# Patient Record
Sex: Male | Born: 1980
Health system: Southern US, Community
[De-identification: ages and names within clinical notes are randomized; demographics above are authoritative.]

## PROBLEM LIST (undated history)

## (undated) DIAGNOSIS — K2270 Barrett's esophagus without dysplasia: Secondary | ICD-10-CM

## (undated) DIAGNOSIS — F329 Major depressive disorder, single episode, unspecified: Secondary | ICD-10-CM

## (undated) DIAGNOSIS — F419 Anxiety disorder, unspecified: Secondary | ICD-10-CM

## (undated) DIAGNOSIS — I2699 Other pulmonary embolism without acute cor pulmonale: Secondary | ICD-10-CM

## (undated) DIAGNOSIS — T7840XA Allergy, unspecified, initial encounter: Secondary | ICD-10-CM

## (undated) DIAGNOSIS — F32A Depression, unspecified: Secondary | ICD-10-CM

## (undated) DIAGNOSIS — K219 Gastro-esophageal reflux disease without esophagitis: Secondary | ICD-10-CM

## (undated) DIAGNOSIS — J302 Other seasonal allergic rhinitis: Secondary | ICD-10-CM

## (undated) DIAGNOSIS — F4310 Post-traumatic stress disorder, unspecified: Secondary | ICD-10-CM

## (undated) HISTORY — PX: MENISCUS REPAIR: SHX5179

## (undated) HISTORY — DX: Anxiety disorder, unspecified: F41.9

## (undated) HISTORY — DX: Other seasonal allergic rhinitis: J30.2

## (undated) HISTORY — DX: Allergy, unspecified, initial encounter: T78.40XA

## (undated) HISTORY — DX: Post-traumatic stress disorder, unspecified: F43.10

## (undated) HISTORY — DX: Major depressive disorder, single episode, unspecified: F32.9

## (undated) HISTORY — PX: WRIST SURGERY: SHX841

## (undated) HISTORY — PX: SHOULDER SURGERY: SHX246

---

## 2011-08-02 DIAGNOSIS — K2270 Barrett's esophagus without dysplasia: Secondary | ICD-10-CM | POA: Insufficient documentation

## 2013-03-09 DIAGNOSIS — T7840XA Allergy, unspecified, initial encounter: Secondary | ICD-10-CM | POA: Insufficient documentation

## 2013-03-09 DIAGNOSIS — K649 Unspecified hemorrhoids: Secondary | ICD-10-CM | POA: Insufficient documentation

## 2013-03-09 DIAGNOSIS — L439 Lichen planus, unspecified: Secondary | ICD-10-CM | POA: Insufficient documentation

## 2013-03-09 DIAGNOSIS — E781 Pure hyperglyceridemia: Secondary | ICD-10-CM | POA: Insufficient documentation

## 2013-03-09 DIAGNOSIS — E559 Vitamin D deficiency, unspecified: Secondary | ICD-10-CM | POA: Insufficient documentation

## 2013-10-20 DIAGNOSIS — M549 Dorsalgia, unspecified: Secondary | ICD-10-CM | POA: Insufficient documentation

## 2014-06-26 HISTORY — PX: WRIST SURGERY: SHX841

## 2014-09-22 ENCOUNTER — Ambulatory Visit: Payer: Self-pay | Admitting: Neurology

## 2015-06-27 HISTORY — PX: OTHER SURGICAL HISTORY: SHX169

## 2015-06-27 HISTORY — PX: UPPER GASTROINTESTINAL ENDOSCOPY: SHX188

## 2015-09-30 DIAGNOSIS — F329 Major depressive disorder, single episode, unspecified: Secondary | ICD-10-CM | POA: Diagnosis not present

## 2015-09-30 DIAGNOSIS — F4310 Post-traumatic stress disorder, unspecified: Secondary | ICD-10-CM | POA: Diagnosis not present

## 2015-09-30 DIAGNOSIS — F4010 Social phobia, unspecified: Secondary | ICD-10-CM | POA: Diagnosis not present

## 2015-10-14 DIAGNOSIS — F329 Major depressive disorder, single episode, unspecified: Secondary | ICD-10-CM | POA: Diagnosis not present

## 2015-10-14 DIAGNOSIS — F4310 Post-traumatic stress disorder, unspecified: Secondary | ICD-10-CM | POA: Diagnosis not present

## 2015-10-14 DIAGNOSIS — F4010 Social phobia, unspecified: Secondary | ICD-10-CM | POA: Diagnosis not present

## 2015-10-25 DIAGNOSIS — F329 Major depressive disorder, single episode, unspecified: Secondary | ICD-10-CM | POA: Diagnosis not present

## 2015-10-25 DIAGNOSIS — F4010 Social phobia, unspecified: Secondary | ICD-10-CM | POA: Diagnosis not present

## 2015-10-25 DIAGNOSIS — F4310 Post-traumatic stress disorder, unspecified: Secondary | ICD-10-CM | POA: Diagnosis not present

## 2015-10-28 DIAGNOSIS — F4310 Post-traumatic stress disorder, unspecified: Secondary | ICD-10-CM | POA: Diagnosis not present

## 2015-10-28 DIAGNOSIS — F4010 Social phobia, unspecified: Secondary | ICD-10-CM | POA: Diagnosis not present

## 2015-10-28 DIAGNOSIS — F329 Major depressive disorder, single episode, unspecified: Secondary | ICD-10-CM | POA: Diagnosis not present

## 2015-11-08 DIAGNOSIS — F4310 Post-traumatic stress disorder, unspecified: Secondary | ICD-10-CM | POA: Diagnosis not present

## 2015-11-08 DIAGNOSIS — F329 Major depressive disorder, single episode, unspecified: Secondary | ICD-10-CM | POA: Diagnosis not present

## 2015-11-08 DIAGNOSIS — F4010 Social phobia, unspecified: Secondary | ICD-10-CM | POA: Diagnosis not present

## 2015-11-11 DIAGNOSIS — F329 Major depressive disorder, single episode, unspecified: Secondary | ICD-10-CM | POA: Diagnosis not present

## 2015-11-11 DIAGNOSIS — F4310 Post-traumatic stress disorder, unspecified: Secondary | ICD-10-CM | POA: Diagnosis not present

## 2015-11-11 DIAGNOSIS — F4010 Social phobia, unspecified: Secondary | ICD-10-CM | POA: Diagnosis not present

## 2016-06-26 DIAGNOSIS — I2699 Other pulmonary embolism without acute cor pulmonale: Secondary | ICD-10-CM

## 2016-06-26 HISTORY — DX: Other pulmonary embolism without acute cor pulmonale: I26.99

## 2016-06-26 HISTORY — PX: SHOULDER SURGERY: SHX246

## 2016-06-26 HISTORY — PX: MENISCUS REPAIR: SHX5179

## 2017-01-31 DIAGNOSIS — R7303 Prediabetes: Secondary | ICD-10-CM | POA: Diagnosis not present

## 2017-01-31 DIAGNOSIS — F419 Anxiety disorder, unspecified: Secondary | ICD-10-CM | POA: Diagnosis not present

## 2017-01-31 DIAGNOSIS — E781 Pure hyperglyceridemia: Secondary | ICD-10-CM | POA: Diagnosis not present

## 2017-01-31 DIAGNOSIS — F332 Major depressive disorder, recurrent severe without psychotic features: Secondary | ICD-10-CM | POA: Diagnosis not present

## 2017-02-22 DIAGNOSIS — F4310 Post-traumatic stress disorder, unspecified: Secondary | ICD-10-CM | POA: Diagnosis not present

## 2017-03-10 DIAGNOSIS — F4310 Post-traumatic stress disorder, unspecified: Secondary | ICD-10-CM | POA: Diagnosis not present

## 2017-03-24 DIAGNOSIS — F4310 Post-traumatic stress disorder, unspecified: Secondary | ICD-10-CM | POA: Diagnosis not present

## 2017-04-14 DIAGNOSIS — F4310 Post-traumatic stress disorder, unspecified: Secondary | ICD-10-CM | POA: Diagnosis not present

## 2017-04-28 DIAGNOSIS — F4310 Post-traumatic stress disorder, unspecified: Secondary | ICD-10-CM | POA: Diagnosis not present

## 2017-05-08 DIAGNOSIS — M25511 Pain in right shoulder: Secondary | ICD-10-CM | POA: Diagnosis not present

## 2017-05-08 DIAGNOSIS — T300 Burn of unspecified body region, unspecified degree: Secondary | ICD-10-CM | POA: Insufficient documentation

## 2017-05-08 DIAGNOSIS — W868XXA Exposure to other electric current, initial encounter: Secondary | ICD-10-CM | POA: Insufficient documentation

## 2017-05-08 DIAGNOSIS — S43022A Posterior subluxation of left humerus, initial encounter: Secondary | ICD-10-CM | POA: Insufficient documentation

## 2017-05-08 DIAGNOSIS — F4310 Post-traumatic stress disorder, unspecified: Secondary | ICD-10-CM | POA: Insufficient documentation

## 2017-05-08 DIAGNOSIS — R52 Pain, unspecified: Secondary | ICD-10-CM | POA: Diagnosis not present

## 2017-05-23 ENCOUNTER — Other Ambulatory Visit: Payer: Self-pay | Admitting: Orthopedic Surgery

## 2017-05-23 DIAGNOSIS — S43005A Unspecified dislocation of left shoulder joint, initial encounter: Secondary | ICD-10-CM

## 2017-05-25 ENCOUNTER — Ambulatory Visit
Admission: RE | Admit: 2017-05-25 | Discharge: 2017-05-25 | Disposition: A | Discharge status: Home / Self Care | Payer: Worker's Compensation | Source: Ambulatory Visit | Attending: Orthopedic Surgery | Admitting: Orthopedic Surgery

## 2017-05-25 DIAGNOSIS — S43005A Unspecified dislocation of left shoulder joint, initial encounter: Secondary | ICD-10-CM

## 2017-05-26 DIAGNOSIS — F4310 Post-traumatic stress disorder, unspecified: Secondary | ICD-10-CM | POA: Diagnosis not present

## 2017-05-30 ENCOUNTER — Other Ambulatory Visit: Payer: Self-pay

## 2017-05-31 DIAGNOSIS — F4310 Post-traumatic stress disorder, unspecified: Secondary | ICD-10-CM | POA: Diagnosis not present

## 2017-06-09 DIAGNOSIS — F4310 Post-traumatic stress disorder, unspecified: Secondary | ICD-10-CM | POA: Diagnosis not present

## 2017-06-20 ENCOUNTER — Emergency Department (HOSPITAL_COMMUNITY): Payer: Worker's Compensation

## 2017-06-20 ENCOUNTER — Encounter (HOSPITAL_COMMUNITY): Payer: Self-pay | Admitting: Emergency Medicine

## 2017-06-20 ENCOUNTER — Inpatient Hospital Stay (HOSPITAL_COMMUNITY)
Admission: EM | Admit: 2017-06-20 | Discharge: 2017-06-24 | DRG: 175 | Disposition: A | Discharge status: Home / Self Care | Payer: Worker's Compensation | Attending: Internal Medicine | Admitting: Internal Medicine

## 2017-06-20 DIAGNOSIS — M549 Dorsalgia, unspecified: Secondary | ICD-10-CM | POA: Diagnosis present

## 2017-06-20 DIAGNOSIS — J189 Pneumonia, unspecified organism: Secondary | ICD-10-CM | POA: Diagnosis present

## 2017-06-20 DIAGNOSIS — Y95 Nosocomial condition: Secondary | ICD-10-CM | POA: Diagnosis present

## 2017-06-20 DIAGNOSIS — T754XXA Electrocution, initial encounter: Secondary | ICD-10-CM | POA: Diagnosis present

## 2017-06-20 DIAGNOSIS — K219 Gastro-esophageal reflux disease without esophagitis: Secondary | ICD-10-CM | POA: Diagnosis present

## 2017-06-20 DIAGNOSIS — M898X1 Other specified disorders of bone, shoulder: Secondary | ICD-10-CM

## 2017-06-20 DIAGNOSIS — Z87891 Personal history of nicotine dependence: Secondary | ICD-10-CM

## 2017-06-20 DIAGNOSIS — R000 Tachycardia, unspecified: Secondary | ICD-10-CM | POA: Diagnosis not present

## 2017-06-20 DIAGNOSIS — J181 Lobar pneumonia, unspecified organism: Secondary | ICD-10-CM | POA: Diagnosis present

## 2017-06-20 DIAGNOSIS — R042 Hemoptysis: Secondary | ICD-10-CM | POA: Diagnosis present

## 2017-06-20 DIAGNOSIS — I2699 Other pulmonary embolism without acute cor pulmonale: Principal | ICD-10-CM | POA: Diagnosis present

## 2017-06-20 DIAGNOSIS — W868XXA Exposure to other electric current, initial encounter: Secondary | ICD-10-CM | POA: Diagnosis present

## 2017-06-20 DIAGNOSIS — S43004A Unspecified dislocation of right shoulder joint, initial encounter: Secondary | ICD-10-CM | POA: Diagnosis present

## 2017-06-20 DIAGNOSIS — Z23 Encounter for immunization: Secondary | ICD-10-CM

## 2017-06-20 DIAGNOSIS — Z79899 Other long term (current) drug therapy: Secondary | ICD-10-CM

## 2017-06-20 HISTORY — DX: Depression, unspecified: F32.A

## 2017-06-20 HISTORY — DX: Barrett's esophagus without dysplasia: K22.70

## 2017-06-20 HISTORY — DX: Gastro-esophageal reflux disease without esophagitis: K21.9

## 2017-06-20 HISTORY — DX: Major depressive disorder, single episode, unspecified: F32.9

## 2017-06-20 LAB — I-STAT TROPONIN, ED: TROPONIN I, POC: 0 ng/mL (ref 0.00–0.08)

## 2017-06-20 LAB — CBC
HCT: 39.6 % (ref 39.0–52.0)
Hemoglobin: 13.4 g/dL (ref 13.0–17.0)
MCH: 31.4 pg (ref 26.0–34.0)
MCHC: 33.8 g/dL (ref 30.0–36.0)
MCV: 92.7 fL (ref 78.0–100.0)
PLATELETS: 448 10*3/uL — AB (ref 150–400)
RBC: 4.27 MIL/uL (ref 4.22–5.81)
RDW: 13.2 % (ref 11.5–15.5)
WBC: 13.3 10*3/uL — ABNORMAL HIGH (ref 4.0–10.5)

## 2017-06-20 LAB — BASIC METABOLIC PANEL
Anion gap: 9 (ref 5–15)
BUN: 17 mg/dL (ref 6–20)
CALCIUM: 9.5 mg/dL (ref 8.9–10.3)
CO2: 26 mmol/L (ref 22–32)
CREATININE: 1.41 mg/dL — AB (ref 0.61–1.24)
Chloride: 100 mmol/L — ABNORMAL LOW (ref 101–111)
GFR calc non Af Amer: >60 mL/min (ref >60)
GLUCOSE: 215 mg/dL — AB (ref 65–99)
Potassium: 4.6 mmol/L (ref 3.5–5.1)
Sodium: 135 mmol/L (ref 135–145)

## 2017-06-20 LAB — LACTIC ACID, PLASMA: Lactic Acid, Venous: 1.3 mmol/L (ref 0.5–1.9)

## 2017-06-20 MED ORDER — IOPAMIDOL (ISOVUE-370) INJECTION 76%
INTRAVENOUS | Status: AC
  Filled 2017-06-20: qty 100

## 2017-06-20 MED ORDER — DEXTROSE 5 % IV SOLN
1.0000 g | Freq: Once | INTRAVENOUS | Status: AC
  Administered 2017-06-20: 1 g via INTRAVENOUS
  Filled 2017-06-20: qty 1

## 2017-06-20 MED ORDER — SODIUM CHLORIDE 0.9 % IV BOLUS (SEPSIS)
1000.0000 mL | Freq: Once | INTRAVENOUS | Status: AC
  Administered 2017-06-20: 1000 mL via INTRAVENOUS

## 2017-06-20 MED ORDER — ONDANSETRON HCL 4 MG/2ML IJ SOLN
4.0000 mg | Freq: Once | INTRAMUSCULAR | Status: AC
  Administered 2017-06-20: 4 mg via INTRAVENOUS
  Filled 2017-06-20: qty 2

## 2017-06-20 MED ORDER — HYDROMORPHONE HCL 1 MG/ML IJ SOLN
1.0000 mg | Freq: Once | INTRAMUSCULAR | Status: AC
  Administered 2017-06-20: 1 mg via INTRAVENOUS
  Filled 2017-06-20: qty 1

## 2017-06-20 MED ORDER — IOPAMIDOL (ISOVUE-370) INJECTION 76%
INTRAVENOUS | Status: AC
  Administered 2017-06-20: 100 mL via INTRAVENOUS
  Filled 2017-06-20: qty 100

## 2017-06-20 MED ORDER — VANCOMYCIN HCL 10 G IV SOLR
2500.0000 mg | Freq: Once | INTRAVENOUS | Status: AC
  Administered 2017-06-20: 2500 mg via INTRAVENOUS
  Filled 2017-06-20: qty 2000

## 2017-06-20 MED ORDER — OXYCODONE-ACETAMINOPHEN 5-325 MG PO TABS
2.0000 | ORAL_TABLET | Freq: Once | ORAL | Status: AC
  Administered 2017-06-20: 2 via ORAL
  Filled 2017-06-20: qty 2

## 2017-06-20 MED ORDER — DIAZEPAM 5 MG PO TABS
5.0000 mg | ORAL_TABLET | Freq: Once | ORAL | Status: AC
  Administered 2017-06-20: 5 mg via ORAL
  Filled 2017-06-20: qty 1

## 2017-06-20 NOTE — ED Provider Notes (Signed)
[facility] [place].  Please see her full H&P.  Harold Collins is a 36 y.o. male presents with chest pain, shortness of breath, scapular pain which is not reproducible on initial exam.  He is found to be tachycardic and tachypneic.  He is afebrile.  Patient with recent surgery on his shoulder.  Concern for possible PE.  Initial CT scan unequivocal for PE but does show infiltrate of the right lower lobe.  One episode of hemoptysis here in the emergency department.  Will begin treatment for healthcare acquired pneumonia including vancomycin and cefepime.   Physical Exam  BP 133/89 (BP Location: Right Arm)   Pulse (!) 106   Temp 98.2 F (36.8 C) (Oral)   Resp (!) 25   Ht 5\' 11"  (1.803 m)   Wt 109.6 kg (241 lb 11.2 oz)   SpO2 94%   BMI 33.71 kg/m   Physical Exam  Constitutional: He appears well-developed and well-nourished. No distress.  HENT:  Head: Normocephalic.  Eyes: Conjunctivae are normal. No scleral icterus.  Neck: Normal range of motion.  Cardiovascular: Intact distal pulses. Tachycardia present.  Pulmonary/Chest: Effort normal. Tachypnea noted.  Musculoskeletal: Normal range of motion.  Neurological: He is alert.  Skin: Skin is warm and dry.  Nursing note and vitals reviewed.    Results for orders placed or performed during the hospital encounter of 06/20/17  Basic metabolic panel  Result Value Ref Range   Sodium 135 135 - 145 mmol/L   Potassium 4.6 3.5 - 5.1 mmol/L   Chloride 100 (L) 101 - 111 mmol/L   CO2 26 22 - 32 mmol/L   Glucose, Bld 215 (H) 65 - 99 mg/dL   BUN 17 6 - 20 mg/dL   Creatinine, Ser 4.541.41 (H) 0.61 - 1.24 mg/dL   Calcium 9.5 8.9 - 09.810.3 mg/dL   GFR calc non Af Amer >60 >60 mL/min   GFR calc Af Amer >60 >60 mL/min   Anion gap 9 5 - 15  CBC  Result Value Ref Range   WBC 13.3 (H) 4.0 - 10.5 K/uL   RBC 4.27 4.22 - 5.81 MIL/uL   Hemoglobin 13.4 13.0 - 17.0 g/dL   HCT 11.939.6 14.739.0 - 82.952.0 %   MCV 92.7 78.0 - 100.0 fL   MCH 31.4 26.0 -  34.0 pg   MCHC 33.8 30.0 - 36.0 g/dL   RDW 56.213.2 13.011.5 - 86.515.5 %   Platelets 448 (H) 150 - 400 K/uL  Lactic acid, plasma  Result Value Ref Range   Lactic Acid, Venous 1.3 0.5 - 1.9 mmol/L  I-stat troponin, ED  Result Value Ref Range   Troponin i, poc 0.00 0.00 - 0.08 ng/mL   Comment 3           Dg Chest 2 View  Result Date: 06/20/2017 CLINICAL DATA:  Shortness of breath and back pain after shoulder surgery 1 week ago. EXAM: CHEST  2 VIEW COMPARISON:  None. FINDINGS: Limited lateral radiograph as patient is unable to elevate the arm. Cardiomediastinal silhouette is unremarkable for this low inspiratory examination with crowded vasculature markings. Patchy bibasilar airspace opacities. Trachea projects midline and there is no pneumothorax. Included soft tissue planes and osseous structures are non-suspicious. IMPRESSION: Patchy bibasilar atelectasis, less likely pneumonia in this low inspiratory examination. Electronically Signed   By: Awilda Metroourtnay  Bloomer M.D.   On: 06/20/2017 19:44   Ct Angio Chest Pe W/cm &/or Wo Cm  Addendum Date: 06/21/2017   ADDENDUM REPORT: 06/21/2017 00:52  ADDENDUM: Repeat CTA images were obtained due to motion. Additional 80 mL Isovue 370 intravenous was administered. Nonocclusive filling defects are present within the right upper lobe lobar, and segmental branches, consistent with acute pulmonary emboli. Evaluation of the right lower lobe branches are still somewhat limited by motion. RV LV ratio is normal. Critical Value/emergent results were called by telephone at the time of interpretation on 06/21/2017 at 12:51 am to Dr. Dahlia Client who assumed care of the patient for [facility] , who verbally acknowledged these results. Electronically Signed   By: Jasmine Pang M.D.   On: 06/21/2017 00:52   Addendum Date: 06/20/2017   ADDENDUM REPORT: 06/20/2017 21:43 ADDENDUM: Findings reviewed with the ordering physician, physician assistant Dayton Scrape, who tells me that the clinical  symptoms are highly worrisome for pulmonary embolism. Again, characterization of the pulmonary artery branches is significantly limited by extensive breathing motion artifact. However, the segmental pulmonary artery branches to the right lower lobe are asymmetrically prominent in size which could be a secondary sign of obstructing pulmonary embolism. Also, the dense consolidation at the right lung base could be sequela of associated pulmonary infarction. Per this discussion, ordering physician will hydrate the patient, confirm adequate renal function, provide additional pain medications and then repeat the CT angiogram for hopefully better characterization of the lower lobe pulmonary arteries. Electronically Signed   By: Bary Richard M.D.   On: 06/20/2017 21:43   Result Date: 06/21/2017 CLINICAL DATA:  Shoulder surgery on December 18th, back pain worsening since surgery. Shortness of breath yesterday. Rule out pulmonary embolism. EXAM: CT ANGIOGRAPHY CHEST WITH CONTRAST TECHNIQUE: Multidetector CT imaging of the chest was performed using the standard protocol during bolus administration of intravenous contrast. Multiplanar CT image reconstructions and MIPs were obtained to evaluate the vascular anatomy. CONTRAST:  ISOVUE-370 IOPAMIDOL (ISOVUE-370) INJECTION 76% COMPARISON:  None. FINDINGS: Cardiovascular: Study is significantly limited by patient breathing motion artifact. There is no large obstructing pulmonary embolism identified within the main or central lobar pulmonary arteries. I cannot confidently characterize the more peripheral segmental and subsegmental pulmonary arteries due to the motion artifact. Heart size is normal. No pericardial effusion. No aortic aneurysm or evidence of aortic dissection. Mediastinum/Nodes: No mass or enlarged lymph nodes seen within the mediastinum or perihilar regions. Esophagus is unremarkable. Trachea and central bronchi are unremarkable. Lungs/Pleura: Dense  consolidation within the right lower lobe, pneumonia versus aspiration. Smaller consolidation at the left lung base. Trace left pleural effusion. Upper lungs are clear. Upper Abdomen: No acute findings. Musculoskeletal: Mild degenerative spurring within the lower thoracic spine. No acute or suspicious osseous finding. Review of the MIP images confirms the above findings. IMPRESSION: 1. Dense consolidation within the right lower lobe, consistent with pneumonia or aspiration. 2. Smaller consolidation at the left lung base, compatible with atelectasis or aspiration. Associated trace left pleural effusion. 3. No central obstructing pulmonary embolism. The majority of the pulmonary artery branches, however, cannot be confidently characterized due to patient breathing motion artifact. Electronically Signed: By: Bary Richard M.D. On: 06/20/2017 20:57      ED Course/Procedures   Clinical Course as of Jun 21 113  Wed Jun 20, 2017  2357 Plan: Patient is receiving 2 L of fluid.  We will repeat his CT scan, control pain and give an additional third liter of fluid.  Patient will likely need admission  [HM]  Thu Jun 21, 2017  1610 Repeat CT shows RUL segmental lobar artery PE RV/LV ratio normal  [HM]  0052 Discussed  with Dr. Julian ReilGardner who will admit  [HM]  [id number] Continues to have tachycardia Pulse Rate: (!) 108 [HM]    Clinical Course User Index [HM] Amye Grego, Dahlia ClientHannah, PA-C      Procedures   CRITICAL CARE Performed by: Dierdre ForthHannah Tascha Casares Total critical care time: 35 minutes Critical care time was exclusive of separately billable procedures and treating other patients. Critical care was necessary to treat or prevent imminent or life-threatening deterioration. Critical care was time spent personally by me on the following activities: development of treatment plan with patient and/or surrogate as well as nursing, discussions with consultants, evaluation of patient's response to treatment, examination  of patient, obtaining history from patient or surrogate, ordering and performing treatments and interventions, ordering and review of laboratory studies, ordering and review of radiographic studies, pulse oximetry and re-evaluation of patient's condition.   MDM   Repeat his CT scan shows pulmonary embolism in addition to his pneumonia.  He will be anticoagulated with heparin and admitted to hospitalist service.  Third liter of fluid has been ordered.  He continues to be tachycardic and tachypneic.  Pain is improved but not resolved.  Discussed with Dr. Julian ReilGardner who will admit.   Tachycardia  Pain of right scapula  HCAP (healthcare-associated pneumonia)  Other acute pulmonary embolism without acute cor pulmonale [facility](HCC)       Hema Lanza, Boyd KerbsHannah, PA-C 06/21/17 [id number]    Vanetta MuldersZackowski, Scott, MD 06/21/17 1544
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
## 2017-06-20 NOTE — ED Notes (Signed)
MD reports he will come assess patient in triage.

## 2017-06-20 NOTE — ED Notes (Signed)
ED Provider at bedside. 

## 2017-06-20 NOTE — Progress Notes (Signed)
A consult was received from an ED physician for vancomycin per pharmacy dosing.  The patient's profile has been reviewed for ht/wt/allergies/indication/available labs.   A one time order has been placed for vancomycin 2500 mg IV x 1 dose.  Further antibiotics/pharmacy consults should be ordered by admitting physician if indicated.                       Thank you, Herby AbrahamMichelle T. Ransom Nickson, Pharm.D. [phone] 06/20/2017 10:19 PM

## 2017-06-20 NOTE — ED Notes (Signed)
Bed: WA06 Expected date:  Expected time:  Means of arrival:  Comments: T1 

## 2017-06-20 NOTE — ED Triage Notes (Signed)
Patient sent by orthopedist to r/o PE. Patient reports having shoulder surgery x1 week ago and back pain worsening since that time. Since yesterday patient has had SOB.

## 2017-06-21 ENCOUNTER — Encounter (HOSPITAL_COMMUNITY): Payer: Self-pay | Admitting: Physician Assistant

## 2017-06-21 ENCOUNTER — Emergency Department (HOSPITAL_COMMUNITY): Payer: Worker's Compensation

## 2017-06-21 ENCOUNTER — Inpatient Hospital Stay (HOSPITAL_COMMUNITY): Payer: Worker's Compensation

## 2017-06-21 ENCOUNTER — Other Ambulatory Visit: Payer: Self-pay

## 2017-06-21 DIAGNOSIS — R000 Tachycardia, unspecified: Secondary | ICD-10-CM | POA: Diagnosis present

## 2017-06-21 DIAGNOSIS — Y95 Nosocomial condition: Secondary | ICD-10-CM | POA: Diagnosis present

## 2017-06-21 DIAGNOSIS — J189 Pneumonia, unspecified organism: Secondary | ICD-10-CM | POA: Diagnosis present

## 2017-06-21 DIAGNOSIS — I2699 Other pulmonary embolism without acute cor pulmonale: Secondary | ICD-10-CM

## 2017-06-21 DIAGNOSIS — J181 Lobar pneumonia, unspecified organism: Secondary | ICD-10-CM | POA: Diagnosis present

## 2017-06-21 DIAGNOSIS — S43004A Unspecified dislocation of right shoulder joint, initial encounter: Secondary | ICD-10-CM | POA: Diagnosis present

## 2017-06-21 DIAGNOSIS — Z79899 Other long term (current) drug therapy: Secondary | ICD-10-CM | POA: Diagnosis not present

## 2017-06-21 DIAGNOSIS — W868XXA Exposure to other electric current, initial encounter: Secondary | ICD-10-CM | POA: Diagnosis present

## 2017-06-21 DIAGNOSIS — Z23 Encounter for immunization: Secondary | ICD-10-CM | POA: Diagnosis not present

## 2017-06-21 DIAGNOSIS — K219 Gastro-esophageal reflux disease without esophagitis: Secondary | ICD-10-CM | POA: Diagnosis present

## 2017-06-21 DIAGNOSIS — R042 Hemoptysis: Secondary | ICD-10-CM | POA: Diagnosis present

## 2017-06-21 DIAGNOSIS — Z87891 Personal history of nicotine dependence: Secondary | ICD-10-CM | POA: Diagnosis not present

## 2017-06-21 DIAGNOSIS — M898X1 Other specified disorders of bone, shoulder: Secondary | ICD-10-CM | POA: Diagnosis not present

## 2017-06-21 DIAGNOSIS — T754XXA Electrocution, initial encounter: Secondary | ICD-10-CM | POA: Diagnosis present

## 2017-06-21 DIAGNOSIS — M549 Dorsalgia, unspecified: Secondary | ICD-10-CM | POA: Diagnosis present

## 2017-06-21 HISTORY — DX: Other pulmonary embolism without acute cor pulmonale: I26.99

## 2017-06-21 LAB — COMPREHENSIVE METABOLIC PANEL
ALK PHOS: 53 U/L (ref 38–126)
ALT: 25 U/L (ref 17–63)
ANION GAP: 9 (ref 5–15)
AST: 14 U/L — ABNORMAL LOW (ref 15–41)
Albumin: 4 g/dL (ref 3.5–5.0)
BILIRUBIN TOTAL: 0.8 mg/dL (ref 0.3–1.2)
BUN: 14 mg/dL (ref 6–20)
CALCIUM: 8.7 mg/dL — AB (ref 8.9–10.3)
CO2: 25 mmol/L (ref 22–32)
Chloride: 101 mmol/L (ref 101–111)
Creatinine, Ser: 1.08 mg/dL (ref 0.61–1.24)
Glucose, Bld: 108 mg/dL — ABNORMAL HIGH (ref 65–99)
POTASSIUM: 4.3 mmol/L (ref 3.5–5.1)
Sodium: 135 mmol/L (ref 135–145)
TOTAL PROTEIN: 7.5 g/dL (ref 6.5–8.1)

## 2017-06-21 LAB — ECHOCARDIOGRAM COMPLETE
AOASC: 30 cm
CHL CUP MV DEC (S): 148
EWDT: 148 ms
FS: 36 % (ref 28–44)
Height: 71 in
IVS/LV PW RATIO, ED: 0.96
LA ID, A-P, ES: 44 mm
LA diam end sys: 44 mm
LA diam index: 1.85 cm/m2
LA vol A4C: 35.9 ml
LA vol index: 17.2 mL/m2
LAVOL: 40.8 mL
LV PW d: 11.2 mm — AB (ref 0.6–1.1)
LVOT area: 3.8 cm2
LVOT diameter: 22 mm
MV pk A vel: 67 m/s
MVPG: 3 mmHg
MVPKEVEL: 84.6 m/s
TAPSE: 37.9 mm
Weight: 3867.2 oz

## 2017-06-21 LAB — HEPARIN LEVEL (UNFRACTIONATED)
HEPARIN UNFRACTIONATED: 0.2 [IU]/mL — AB (ref 0.30–0.70)
Heparin Unfractionated: 0.13 IU/mL — ABNORMAL LOW (ref 0.30–0.70)
Heparin Unfractionated: 0.12 IU/mL — ABNORMAL LOW (ref 0.30–0.70)

## 2017-06-21 LAB — CBC
HCT: 35.6 % — ABNORMAL LOW (ref 39.0–52.0)
Hemoglobin: 11.9 g/dL — ABNORMAL LOW (ref 13.0–17.0)
MCH: 31 pg (ref 26.0–34.0)
MCHC: 33.4 g/dL (ref 30.0–36.0)
MCV: 92.7 fL (ref 78.0–100.0)
PLATELETS: 392 10*3/uL (ref 150–400)
RBC: 3.84 MIL/uL — AB (ref 4.22–5.81)
RDW: 13.2 % (ref 11.5–15.5)
WBC: 9.1 10*3/uL (ref 4.0–10.5)

## 2017-06-21 LAB — HIV ANTIBODY (ROUTINE TESTING W REFLEX): HIV Screen 4th Generation wRfx: NONREACTIVE

## 2017-06-21 LAB — EXPECTORATED SPUTUM ASSESSMENT W GRAM STAIN, RFLX TO RESP C

## 2017-06-21 LAB — APTT: APTT: 31 s (ref 24–36)

## 2017-06-21 LAB — PROTIME-INR
INR: 1.02
PROTHROMBIN TIME: 13.3 s (ref 11.4–15.2)

## 2017-06-21 LAB — STREP PNEUMONIAE URINARY ANTIGEN: STREP PNEUMO URINARY ANTIGEN: NEGATIVE

## 2017-06-21 LAB — PROCALCITONIN: PROCALCITONIN: 0.11 ng/mL

## 2017-06-21 LAB — EXPECTORATED SPUTUM ASSESSMENT W REFEX TO RESP CULTURE

## 2017-06-21 LAB — LACTIC ACID, PLASMA: Lactic Acid, Venous: 1.6 mmol/L (ref 0.5–1.9)

## 2017-06-21 LAB — TROPONIN I: Troponin I: <0.03 ng/mL (ref <0.03)

## 2017-06-21 MED ORDER — HEPARIN (PORCINE) IN NACL 100-0.45 UNIT/ML-% IJ SOLN
2600.0000 [IU]/h | INTRAMUSCULAR | Status: DC
  Administered 2017-06-21: 2600 [IU]/h via INTRAVENOUS
  Filled 2017-06-21: qty 250

## 2017-06-21 MED ORDER — FENOFIBRATE 160 MG PO TABS
160.0000 mg | ORAL_TABLET | Freq: Every day | ORAL | Status: DC
  Administered 2017-06-21 – 2017-06-24 (×4): 160 mg via ORAL
  Filled 2017-06-21 (×4): qty 1

## 2017-06-21 MED ORDER — HYDROMORPHONE HCL 1 MG/ML IJ SOLN
2.0000 mg | INTRAMUSCULAR | Status: DC | PRN
  Administered 2017-06-21 – 2017-06-23 (×8): 2 mg via INTRAVENOUS
  Filled 2017-06-21 (×8): qty 2

## 2017-06-21 MED ORDER — INFLUENZA VAC SPLIT QUAD 0.5 ML IM SUSY
0.5000 mL | PREFILLED_SYRINGE | INTRAMUSCULAR | Status: AC
  Administered 2017-06-23: 0.5 mL via INTRAMUSCULAR
  Filled 2017-06-21 (×2): qty 0.5

## 2017-06-21 MED ORDER — HEPARIN BOLUS VIA INFUSION
1300.0000 [IU] | Freq: Once | INTRAVENOUS | Status: AC
  Administered 2017-06-21: 1300 [IU] via INTRAVENOUS
  Filled 2017-06-21: qty 1300

## 2017-06-21 MED ORDER — METHOCARBAMOL 1000 MG/10ML IJ SOLN
1000.0000 mg | Freq: Three times a day (TID) | INTRAVENOUS | Status: DC
  Administered 2017-06-21 – 2017-06-24 (×9): 1000 mg via INTRAVENOUS
  Filled 2017-06-21 (×11): qty 10

## 2017-06-21 MED ORDER — ADULT MULTIVITAMIN W/MINERALS CH
1.0000 | ORAL_TABLET | Freq: Every day | ORAL | Status: DC
  Administered 2017-06-21 – 2017-06-24 (×4): 1 via ORAL
  Filled 2017-06-21 (×4): qty 1

## 2017-06-21 MED ORDER — PANTOPRAZOLE SODIUM 40 MG PO TBEC
80.0000 mg | DELAYED_RELEASE_TABLET | Freq: Every day | ORAL | Status: DC
  Administered 2017-06-21 – 2017-06-24 (×4): 80 mg via ORAL
  Filled 2017-06-21 (×4): qty 2

## 2017-06-21 MED ORDER — HEPARIN BOLUS VIA INFUSION
3000.0000 [IU] | Freq: Once | INTRAVENOUS | Status: AC
  Administered 2017-06-21: 3000 [IU] via INTRAVENOUS
  Filled 2017-06-21: qty 3000

## 2017-06-21 MED ORDER — HYDROMORPHONE HCL 1 MG/ML IJ SOLN
2.0000 mg | INTRAMUSCULAR | Status: DC | PRN
  Administered 2017-06-21 (×2): 1 mg via INTRAVENOUS
  Filled 2017-06-21 (×2): qty 2

## 2017-06-21 MED ORDER — HEPARIN BOLUS VIA INFUSION
4000.0000 [IU] | Freq: Once | INTRAVENOUS | Status: AC
  Administered 2017-06-21: 4000 [IU] via INTRAVENOUS
  Filled 2017-06-21: qty 4000

## 2017-06-21 MED ORDER — HEPARIN BOLUS VIA INFUSION
2000.0000 [IU] | Freq: Once | INTRAVENOUS | Status: AC
  Administered 2017-06-21: 2000 [IU] via INTRAVENOUS
  Filled 2017-06-21: qty 2000

## 2017-06-21 MED ORDER — NAPROXEN 500 MG PO TABS
500.0000 mg | ORAL_TABLET | Freq: Two times a day (BID) | ORAL | Status: DC
Start: 2017-06-21 — End: 2017-06-21

## 2017-06-21 MED ORDER — DIAZEPAM 5 MG PO TABS
5.0000 mg | ORAL_TABLET | Freq: Four times a day (QID) | ORAL | Status: DC | PRN
  Administered 2017-06-21: 5 mg via ORAL
  Filled 2017-06-21: qty 1

## 2017-06-21 MED ORDER — HEPARIN (PORCINE) IN NACL 100-0.45 UNIT/ML-% IJ SOLN
2150.0000 [IU]/h | INTRAMUSCULAR | Status: DC
  Administered 2017-06-21: 2000 [IU]/h via INTRAVENOUS
  Filled 2017-06-21: qty 250

## 2017-06-21 MED ORDER — VITAMIN D3 25 MCG (1000 UNIT) PO TABS
2000.0000 [IU] | ORAL_TABLET | Freq: Every day | ORAL | Status: DC
  Administered 2017-06-21 – 2017-06-24 (×4): 2000 [IU] via ORAL
  Filled 2017-06-21 (×4): qty 2

## 2017-06-21 MED ORDER — HYDROMORPHONE HCL 1 MG/ML IJ SOLN
1.0000 mg | INTRAMUSCULAR | Status: DC | PRN
  Administered 2017-06-21 (×2): 1 mg via INTRAVENOUS
  Filled 2017-06-21 (×2): qty 1

## 2017-06-21 MED ORDER — OXYCODONE-ACETAMINOPHEN 5-325 MG PO TABS
1.0000 | ORAL_TABLET | ORAL | Status: DC | PRN
  Administered 2017-06-22 – 2017-06-24 (×7): 2 via ORAL
  Filled 2017-06-21 (×8): qty 2

## 2017-06-21 MED ORDER — HEPARIN (PORCINE) IN NACL 100-0.45 UNIT/ML-% IJ SOLN
1700.0000 [IU]/h | INTRAMUSCULAR | Status: DC
  Administered 2017-06-21: 1700 [IU]/h via INTRAVENOUS
  Filled 2017-06-21: qty 250

## 2017-06-21 MED ORDER — ONDANSETRON HCL 4 MG PO TABS
4.0000 mg | ORAL_TABLET | Freq: Three times a day (TID) | ORAL | Status: DC | PRN
  Administered 2017-06-22: 4 mg via ORAL
  Filled 2017-06-21 (×2): qty 1

## 2017-06-21 MED ORDER — SODIUM CHLORIDE 0.9 % IV BOLUS (SEPSIS)
1000.0000 mL | Freq: Once | INTRAVENOUS | Status: AC
  Administered 2017-06-21: 1000 mL via INTRAVENOUS

## 2017-06-21 MED ORDER — VENLAFAXINE HCL ER 75 MG PO CP24
225.0000 mg | ORAL_CAPSULE | Freq: Every day | ORAL | Status: DC
  Administered 2017-06-21 – 2017-06-24 (×4): 225 mg via ORAL
  Filled 2017-06-21 (×4): qty 3

## 2017-06-21 MED ORDER — IOPAMIDOL (ISOVUE-370) INJECTION 76%
100.0000 mL | Freq: Once | INTRAVENOUS | Status: AC | PRN
  Administered 2017-06-21: 80 mL via INTRAVENOUS

## 2017-06-21 MED ORDER — HYDROMORPHONE HCL 1 MG/ML IJ SOLN: 1.0000 mg | INTRAMUSCULAR | Status: DC | PRN

## 2017-06-21 MED ORDER — OXYCODONE-ACETAMINOPHEN 5-325 MG PO TABS: 1.0000 | ORAL_TABLET | ORAL | Status: DC | PRN

## 2017-06-21 MED ORDER — VANCOMYCIN HCL IN DEXTROSE 1-5 GM/200ML-% IV SOLN
1000.0000 mg | Freq: Two times a day (BID) | INTRAVENOUS | Status: DC
  Administered 2017-06-21 – 2017-06-22 (×3): 1000 mg via INTRAVENOUS
  Filled 2017-06-21 (×3): qty 200

## 2017-06-21 MED ORDER — DEXTROSE 5 % IV SOLN
1.0000 g | Freq: Three times a day (TID) | INTRAVENOUS | Status: DC
  Administered 2017-06-21 – 2017-06-23 (×8): 1 g via INTRAVENOUS
  Filled 2017-06-21 (×9): qty 1

## 2017-06-21 NOTE — Progress Notes (Signed)
  Echocardiogram 2D Echocardiogram has been performed.  Harold Collins G Harold Collins 06/21/2017, 2:51 PM

## 2017-06-21 NOTE — Progress Notes (Signed)
ANTICOAGULATION CONSULT NOTE - Follow Up Consult  Pharmacy Consult for IV heparin Indication: pulmonary embolus  No Known Allergies  Patient Measurements: Height: 5\' 11"  (180.3 cm) Weight: 241 lb 11.2 oz (109.6 kg) IBW/kg (Calculated) : 75.3 Heparin Dosing Weight: 85 kg  Vital Signs: Temp: 98.1 F (36.7 C) (12/27 0611) Temp Source: Oral (12/27 0611) BP: 137/86 (12/27 0611) Pulse Rate: 101 (12/27 0611)  Labs: Recent Labs    06/20/17 1910 06/21/17 0125 06/21/17 0642  HGB 13.4  --  11.9*  HCT 39.6  --  35.6*  PLT 448*  --  392  APTT  --  31  --   LABPROT  --  13.3  --   INR  --  1.02  --   HEPARINUNFRC  --   --  0.13*  CREATININE 1.41*  --  1.08  TROPONINI  --   --  <0.03    Estimated Creatinine Clearance: 120.2 mL/min (by C-G formula based on SCr of 1.08 mg/dL).   Medical History: Past Medical History:  Diagnosis Date  . Barrett's esophagus   . Depression   . GERD (gastroesophageal reflux disease)     Medications:  Scheduled:  . cholecalciferol  2,000 Units Oral Daily  . fenofibrate  160 mg Oral Daily  . [START ON 06/22/2017] Influenza vac split quadrivalent PF  0.5 mL Intramuscular Tomorrow-1000  . iopamidol      . multivitamin with minerals  1 tablet Oral Daily  . pantoprazole  80 mg Oral Daily  . venlafaxine XR  225 mg Oral Daily   Infusions:  . ceFEPime (MAXIPIME) IV 1 g (06/21/17 0629)  . heparin 1,700 Units/hr (06/21/17 0145)  . vancomycin 1,000 mg (06/21/17 0901)    Assessment: 36 yoM s/p shoulder surgery x1 week ago now with worsening back pain and SOB. + PE on CT. IV heparin for PE.   Baseline coag labs WNL. First heparin level subtherapeutic at 0.13 units/ml.  Infusion currently at 1700 units/hr. Hgb 11.9, platelets WNL  Goal of Therapy:  Heparin level 0.3-0.7 units/ml Monitor platelets by anticoagulation protocol: Yes   Plan:  Heparin 3000 unit bolus x1 and increase rate of infusion to 2000 units/hr. Check HL in 6 hours Daily  CBC/HL while on heparin infusion.  Clance Bollunyon, Adilson Grafton 06/21/2017,9:11 AM

## 2017-06-21 NOTE — Progress Notes (Signed)
Patient ID: [name], male   DOB: 1980/07/21, 36 y.o.   MRN: [id number] Patient was admitted early this morning for chest pain or shortness of breath and was found to have pulmonary embolism along with pneumonia.  He was started on heparin drip and intravenous antibiotics.  Patient was seen and examined at bedside and plan of care discussed with the patient.  I also have spoken to the patient's wife on phone and discussed the plan of care.  I reviewed the CAT scan done on admission with the radiologist on phone.  Patient is in significant shoulder and back pain.  Increase dose and frequency of Dilaudid.  [facility]  If pain does not get better, consider PCA pump.  Patient does not have any symptoms of pulmonary TB including cough, weight loss, night sweats.  He does not have any imaging findings consistent with TB.  Discussed with infection controlled on phone and also discussed the case with his pulmonologist/Dr. Craige CottaSood who agrees that airborne precautions can be discontinued.  Discontinue airborne precautions

## 2017-06-21 NOTE — Progress Notes (Signed)
Patient coughed up small dark red tinged sputum, but nurse was not able to collect specimen for lab.

## 2017-06-21 NOTE — Progress Notes (Signed)
ANTICOAGULATION CONSULT NOTE - Follow Up Consult  Pharmacy Consult for IV heparin Indication: pulmonary embolus  No Known Allergies  Patient Measurements: Height: 5\' 11"  (180.3 cm) Weight: 241 lb 11.2 oz (109.6 kg) IBW/kg (Calculated) : 75.3 Heparin Dosing Weight: 85 kg  Vital Signs: Temp: 97.5 F (36.4 C) (12/27 2240) Temp Source: Oral (12/27 2240) BP: 162/92 (12/27 2240) Pulse Rate: 110 (12/27 2240)  Labs: Recent Labs    06/20/17 1910 06/21/17 0125 06/21/17 [id number] 06/21/17 0841 06/21/17 1508 06/21/17 2153  HGB 13.4  --  11.9*  --   --   --   HCT 39.6  --  35.6*  --   --   --   PLT 448*  --  392  --   --   --   APTT  --  31  --   --   --   --   LABPROT  --  13.3  --   --   --   --   INR  --  1.02  --   --   --   --   HEPARINUNFRC  --   --  0.13*  --  0.20* 0.12*  CREATININE 1.41*  --  1.08  --   --   --   TROPONINI  --   --  <0.03 <0.03 <0.03  --     Estimated Creatinine Clearance: 120.2 mL/min (by C-G formula based on SCr of 1.08 mg/dL).   Medical History: Past Medical History:  Diagnosis Date  . Barrett's esophagus   . Depression   . GERD (gastroesophageal reflux disease)     Medications:  Scheduled:  . cholecalciferol  2,000 Units Oral Daily  . fenofibrate  160 mg Oral Daily  . heparin  2,000 Units Intravenous Once  . [START ON 06/22/2017] Influenza vac split quadrivalent PF  0.5 mL Intramuscular Tomorrow-1000  . multivitamin with minerals  1 tablet Oral Daily  . pantoprazole  80 mg Oral Daily  . venlafaxine XR  225 mg Oral Daily   Infusions:  . ceFEPime (MAXIPIME) IV 1 g (06/21/17 2319)  . heparin    . methocarbamol (ROBAXIN)  IV 1,000 mg (06/21/17 2320)  . vancomycin 1,000 mg (06/21/17 2151)    Assessment: 36 yoM s/p shoulder surgery x1 week ago now with worsening back pain and SOB. + PE on CT. IV heparin for PE.  Baseline coag labs WNL.   2153 HL=0.12 on 2150 units/hr, still below goal, no infusion issues, pt with hymoptysis per RN.  Goal  of Therapy:  Heparin level 0.3-0.7 units/ml Monitor platelets by anticoagulation protocol: Yes   Plan:  Heparin 2000 unit bolus x1 and increase rate of infusion to 2600 units/hr. Check HL in 6 hours Daily CBC/HL while on heparin infusion.  Lorenza EvangelistGreen, Alane Hanssen R 06/21/2017, 11:30 PM

## 2017-06-21 NOTE — Consult Note (Signed)
Reason for Consult:Recent left shoulder surgery, admitted with SOB and chest pain Referring Physician: Starla Link  HPI: Jameon Deller is an 36 y.o. male well known to me, s/p recent left shoulder surgery (12/18) for repair of injury sustained during work related electrocution with traumatic posterior shoulder dislocation last month. Patient had been admitted to the burn unit at [facility] after initial electrocution injury with significant myoglobinuria and ARF.   Patient initially did well after recent shoulder surgery, reports developing right sided posterior chest pain christmas day, seen in my office yesterday with SOB and continued chest pain, referred to ED to for eval of possible PE which has been confirmed along with pneumonia.  Past Medical History:  Diagnosis Date  . Barrett's esophagus   . Depression   . GERD (gastroesophageal reflux disease)     Past Surgical History:  Procedure Laterality Date  . SHOULDER SURGERY    . WRIST SURGERY      History reviewed. No pertinent family history.  Social History:  reports that he quit smoking about 8 weeks ago. he has never used smokeless tobacco. He reports that he does not use drugs. His alcohol history is not on file.  Allergies: No Known Allergies  Medications: I have reviewed the patient's current medications.  Results for orders placed or performed during the hospital encounter of 06/20/17 (from the past 48 hour(s))  Basic metabolic panel     Status: Abnormal   Collection Time: 06/20/17  7:10 PM  Result Value Ref Range   Sodium 135 135 - 145 mmol/L   Potassium 4.6 3.5 - 5.1 mmol/L   Chloride 100 (L) 101 - 111 mmol/L   CO2 26 22 - 32 mmol/L   Glucose, Bld 215 (H) 65 - 99 mg/dL   BUN 17 6 - 20 mg/dL   Creatinine, Ser 1.41 (H) 0.61 - 1.24 mg/dL   Calcium 9.5 8.9 - 10.3 mg/dL   GFR calc non Af Amer >60 >60 mL/min   GFR calc Af Amer >60 >60 mL/min    Comment: (NOTE) The eGFR has been calculated using the CKD EPI equation. This  calculation has not been validated in all clinical situations. eGFR's persistently <60 mL/min signify possible Chronic Kidney Disease.    Anion gap 9 5 - 15  CBC     Status: Abnormal   Collection Time: 06/20/17  7:10 PM  Result Value Ref Range   WBC 13.3 (H) 4.0 - 10.5 K/uL   RBC 4.27 4.22 - 5.81 MIL/uL   Hemoglobin 13.4 13.0 - 17.0 g/dL   HCT 39.6 39.0 - 52.0 %   MCV 92.7 78.0 - 100.0 fL   MCH 31.4 26.0 - 34.0 pg   MCHC 33.8 30.0 - 36.0 g/dL   RDW 13.2 11.5 - 15.5 %   Platelets 448 (H) 150 - 400 K/uL  I-stat troponin, ED     Status: None   Collection Time: 06/20/17  7:18 PM  Result Value Ref Range   Troponin i, poc 0.00 0.00 - 0.08 ng/mL   Comment 3            Comment: Due to the release kinetics of cTnI, a negative result within the first hours of the onset of symptoms does not rule out myocardial infarction with certainty. If myocardial infarction is still suspected, repeat the test at appropriate intervals.   Lactic acid, plasma     Status: None   Collection Time: 06/20/17 10:13 PM  Result Value Ref Range   Lactic  Acid, Venous 1.3 0.5 - 1.9 mmol/L  Lactic acid, plasma     Status: None   Collection Time: 06/20/17 10:13 PM  Result Value Ref Range   Lactic Acid, Venous 1.6 0.5 - 1.9 mmol/L  APTT     Status: None   Collection Time: 06/21/17  1:25 AM  Result Value Ref Range   aPTT 31 24 - 36 seconds  Protime-INR     Status: None   Collection Time: 06/21/17  1:25 AM  Result Value Ref Range   Prothrombin Time 13.3 11.4 - 15.2 seconds   INR 1.02   Heparin level (unfractionated)     Status: Abnormal   Collection Time: 06/21/17  6:42 AM  Result Value Ref Range   Heparin Unfractionated 0.13 (L) 0.30 - 0.70 IU/mL    Comment:        IF HEPARIN RESULTS ARE BELOW EXPECTED VALUES, AND PATIENT DOSAGE HAS BEEN CONFIRMED, SUGGEST FOLLOW UP TESTING OF ANTITHROMBIN III LEVELS.   CBC     Status: Abnormal   Collection Time: 06/21/17  6:42 AM  Result Value Ref Range   WBC 9.1  4.0 - 10.5 K/uL   RBC 3.84 (L) 4.22 - 5.81 MIL/uL   Hemoglobin 11.9 (L) 13.0 - 17.0 g/dL   HCT 35.6 (L) 39.0 - 52.0 %   MCV 92.7 78.0 - 100.0 fL   MCH 31.0 26.0 - 34.0 pg   MCHC 33.4 30.0 - 36.0 g/dL   RDW 13.2 11.5 - 15.5 %   Platelets 392 150 - 400 K/uL  Procalcitonin - Baseline     Status: None   Collection Time: 06/21/17  6:42 AM  Result Value Ref Range   Procalcitonin 0.11 ng/mL    Comment:        Interpretation: PCT (Procalcitonin) <= 0.5 ng/mL: Systemic infection (sepsis) is not likely. Local bacterial infection is possible. (NOTE)       Sepsis PCT Algorithm           Lower Respiratory Tract                                      Infection PCT Algorithm    ----------------------------     ----------------------------         PCT < 0.25 ng/mL                PCT < 0.10 ng/mL         Strongly encourage             Strongly discourage   discontinuation of antibiotics    initiation of antibiotics    ----------------------------     -----------------------------       PCT 0.25 - 0.50 ng/mL            PCT 0.10 - 0.25 ng/mL               OR       >80% decrease in PCT            Discourage initiation of                                            antibiotics      Encourage discontinuation           of antibiotics    ----------------------------     -----------------------------  PCT >= 0.50 ng/mL              PCT 0.26 - 0.50 ng/mL               AND        <80% decrease in PCT             Encourage initiation of                                             antibiotics       Encourage continuation           of antibiotics    ----------------------------     -----------------------------        PCT >= 0.50 ng/mL                  PCT > 0.50 ng/mL               AND         increase in PCT                  Strongly encourage                                      initiation of antibiotics    Strongly encourage escalation           of antibiotics                                      -----------------------------                                           PCT <= 0.25 ng/mL                                                 OR                                        > 80% decrease in PCT                                     Discontinue / Do not initiate                                             antibiotics   HIV antibody (Routine Testing)     Status: None   Collection Time: 06/21/17  6:42 AM  Result Value Ref Range   HIV Screen 4th Generation wRfx Non Reactive Non Reactive    Comment: (NOTE) Performed At: [facility] [place], [place] [id number] Rush Farmer MD CM:[id number]   Troponin I (q 6hr x 3)  Status: None   Collection Time: 06/21/17  6:42 AM  Result Value Ref Range   Troponin I <0.03 <0.03 ng/mL  Comprehensive metabolic panel     Status: Abnormal   Collection Time: 06/21/17  6:42 AM  Result Value Ref Range   Sodium 135 135 - 145 mmol/L   Potassium 4.3 3.5 - 5.1 mmol/L   Chloride 101 101 - 111 mmol/L   CO2 25 22 - 32 mmol/L   Glucose, Bld 108 (H) 65 - 99 mg/dL   BUN 14 6 - 20 mg/dL   Creatinine, Ser 1.08 0.61 - 1.24 mg/dL   Calcium 8.7 (L) 8.9 - 10.3 mg/dL   Total Protein 7.5 6.5 - 8.1 g/dL   Albumin 4.0 3.5 - 5.0 g/dL   AST 14 (L) 15 - 41 U/L   ALT 25 17 - 63 U/L   Alkaline Phosphatase 53 38 - 126 U/L   Total Bilirubin 0.8 0.3 - 1.2 mg/dL   GFR calc non Af Amer >60 >60 mL/min   GFR calc Af Amer >60 >60 mL/min    Comment: (NOTE) The eGFR has been calculated using the CKD EPI equation. This calculation has not been validated in all clinical situations. eGFR's persistently <60 mL/min signify possible Chronic Kidney Disease.    Anion gap 9 5 - 15  Troponin I (q 6hr x 3)     Status: None   Collection Time: 06/21/17  8:41 AM  Result Value Ref Range   Troponin I <0.03 <0.03 ng/mL  Strep pneumoniae urinary antigen     Status: None   Collection Time: 06/21/17 11:17 AM  Result Value Ref Range   Strep Pneumo Urinary  Antigen NEGATIVE NEGATIVE    Comment:        Infection due to S. pneumoniae cannot be absolutely ruled out since the antigen present may be below the detection limit of the test. Performed at [facility], [place] [address], [place], [place] [address]   Culture, sputum-assessment     Status: None   Collection Time: 06/21/17  3:00 PM  Result Value Ref Range   Specimen Description EXPECTORATED SPUTUM    Special Requests NONE    Sputum evaluation THIS SPECIMEN IS ACCEPTABLE FOR SPUTUM CULTURE    Report Status 06/21/2017 FINAL   Troponin I (q 6hr x 3)     Status: None   Collection Time: 06/21/17  3:08 PM  Result Value Ref Range   Troponin I <0.03 <0.03 ng/mL  Heparin level (unfractionated)     Status: Abnormal   Collection Time: 06/21/17  3:08 PM  Result Value Ref Range   Heparin Unfractionated 0.20 (L) 0.30 - 0.70 IU/mL    Comment:        IF HEPARIN RESULTS ARE BELOW EXPECTED VALUES, AND PATIENT DOSAGE HAS BEEN CONFIRMED, SUGGEST FOLLOW UP TESTING OF ANTITHROMBIN III LEVELS.     Dg Chest 2 View  Result Date: 06/20/2017 CLINICAL DATA:  Shortness of breath and back pain after shoulder surgery 1 week ago. EXAM: CHEST  2 VIEW COMPARISON:  None. FINDINGS: Limited lateral radiograph as patient is unable to elevate the arm. Cardiomediastinal silhouette is unremarkable for this low inspiratory examination with crowded vasculature markings. Patchy bibasilar airspace opacities. Trachea projects midline and there is no pneumothorax. Included soft tissue planes and osseous structures are non-suspicious. IMPRESSION: Patchy bibasilar atelectasis, less likely pneumonia in this low inspiratory examination. Electronically Signed   By: Elon Alas M.D.   On: 06/20/2017 19:44  Ct Angio Chest Pe W/cm &/or Wo Cm  Addendum Date: 06/21/2017   ADDENDUM REPORT: 06/21/2017 00:52 ADDENDUM: Repeat CTA images were obtained due to motion. Additional 80 mL Isovue 370 intravenous was administered.  Nonocclusive filling defects are present within the right upper lobe lobar, and segmental branches, consistent with acute pulmonary emboli. Evaluation of the right lower lobe branches are still somewhat limited by motion. RV LV ratio is normal. Critical Value/emergent results were called by telephone at the time of interpretation on 06/21/2017 at 12:51 am to Dr. Jarrett Soho who assumed care of the patient for [facility] , who verbally acknowledged these results. Electronically Signed   By: Donavan Foil M.D.   On: 06/21/2017 00:52   Addendum Date: 06/20/2017   ADDENDUM REPORT: 06/20/2017 21:43 ADDENDUM: Findings reviewed with the ordering physician, physician assistant Valere Dross, who tells me that the clinical symptoms are highly worrisome for pulmonary embolism. Again, characterization of the pulmonary artery branches is significantly limited by extensive breathing motion artifact. However, the segmental pulmonary artery branches to the right lower lobe are asymmetrically prominent in size which could be a secondary sign of obstructing pulmonary embolism. Also, the dense consolidation at the right lung base could be sequela of associated pulmonary infarction. Per this discussion, ordering physician will hydrate the patient, confirm adequate renal function, provide additional pain medications and then repeat the CT angiogram for hopefully better characterization of the lower lobe pulmonary arteries. Electronically Signed   By: Franki Cabot M.D.   On: 06/20/2017 21:43   Result Date: 06/21/2017 CLINICAL DATA:  Shoulder surgery on December 18th, back pain worsening since surgery. Shortness of breath yesterday. Rule out pulmonary embolism. EXAM: CT ANGIOGRAPHY CHEST WITH CONTRAST TECHNIQUE: Multidetector CT imaging of the chest was performed using the standard protocol during bolus administration of intravenous contrast. Multiplanar CT image reconstructions and MIPs were obtained to evaluate the vascular anatomy.  CONTRAST:  120m ISOVUE-370 IOPAMIDOL (ISOVUE-370) INJECTION 76% COMPARISON:  None. FINDINGS: Cardiovascular: Study is significantly limited by patient breathing motion artifact. There is no large obstructing pulmonary embolism identified within the main or central lobar pulmonary arteries. I cannot confidently characterize the more peripheral segmental and subsegmental pulmonary arteries due to the motion artifact. Heart size is normal. No pericardial effusion. No aortic aneurysm or evidence of aortic dissection. Mediastinum/Nodes: No mass or enlarged lymph nodes seen within the mediastinum or perihilar regions. Esophagus is unremarkable. Trachea and central bronchi are unremarkable. Lungs/Pleura: Dense consolidation within the right lower lobe, pneumonia versus aspiration. Smaller consolidation at the left lung base. Trace left pleural effusion. Upper lungs are clear. Upper Abdomen: No acute findings. Musculoskeletal: Mild degenerative spurring within the lower thoracic spine. No acute or suspicious osseous finding. Review of the MIP images confirms the above findings. IMPRESSION: 1. Dense consolidation within the right lower lobe, consistent with pneumonia or aspiration. 2. Smaller consolidation at the left lung base, compatible with atelectasis or aspiration. Associated trace left pleural effusion. 3. No central obstructing pulmonary embolism. The majority of the pulmonary artery branches, however, cannot be confidently characterized due to patient breathing motion artifact. Electronically Signed: By: SFranki CabotM.D. On: 06/20/2017 20:57     Vitals Temp:  [98.1 F (36.7 C)-98.4 F (36.9 C)] 98.4 F (36.9 C) (12/27 1500) Pulse Rate:  [97-124] 119 (12/27 1500) Resp:  [12-36] 20 (12/27 1500) BP: (121-152)/(79-97) 152/82 (12/27 1500) SpO2:  [87 %-97 %] 93 % (12/27 1727) Weight:  [109.6 kg (241 lb 11.2 oz)] 109.6 kg (241 lb 11.2 oz) (  12/26 2210) Body mass index is 33.71 kg/m.  Physical Exam:  sitting in chair at bedside, sedated but arousable. L shoulder incision well sealed, healing as expected.     Assessment/Plan: Impression:  1 Acute PE with pneumonia 2 Recent left shoulder surgery with rotator cuff repair and bicep tenodesis, clinically healing well 3 recent electrocution injury with traumatic shoulder injury necessitating 2 above.  Treatment:  I have reassured Mr. Villena regarding his left shoulder which appears to be healing as expected, and that the repair shoulder not be at risk with his reports of "spasms" or muscle tension. We discussed that oversedation should be avoided, and we should work towards a gradual reduction in his narcotic use. Continue sling immobilization to LUE, may position arm for comfort when sitting in chair, may support with pillow, apply ice PRN. F/u in my clinic 1 month. Call if questions.  Jahmar Mckelvy M Argelia Formisano 06/21/2017, 7:07 PM  Contact # [phone]

## 2017-06-21 NOTE — Progress Notes (Signed)
Pharmacy Antibiotic Note  Harold Collins is a 36 y.o. male admitted with CP, SOB and scapular pain on 06/20/2017 with pneumonia.  Pharmacy has been consulted for cefepime and vancomycin dosing.  Plan: Cefepime 1 Gm IV q8h Vancomycin 2500 mg x1 then 1 Gm IV q12h for est AUC=503 Goal AUC=400-500 F/u cultures/levels/scr  Height: 5\' 11"  (180.3 cm) Weight: 241 lb 11.2 oz (109.6 kg) IBW/kg (Calculated) : 75.3  Temp (24hrs), Avg:98.2 F (36.8 C), Min:98.2 F (36.8 C), Max:98.2 F (36.8 C)  Recent Labs  Lab 06/20/17 1910 06/20/17 2213  WBC 13.3*  --   CREATININE 1.41*  --   LATICACIDVEN  --  1.6  1.3    Estimated Creatinine Clearance: 92.1 mL/min (A) (by C-G formula based on SCr of 1.41 mg/dL (H)).    No Known Allergies  Antimicrobials this admission: 12/26 cefepime >>  12/26 vancomycin >>   Dose adjustments this admission:   Microbiology results:  BCx:   UCx:    Sputum:    MRSA PCR:   Thank you for allowing pharmacy to be a part of this patient's care.  Lorenza EvangelistGreen, Harold Collins 06/21/2017 2:17 AM

## 2017-06-21 NOTE — Care Management (Signed)
This nurse received a phone call from a woman claiming to be the patient's workers' Science writercompensation Case Manager. She was asking for information about patient care in hospital and after discharge. This nurse gave no information and took contact information so that the proper person might contact her if appropriate.  Contact information for said "Case Manager" is below. Mariel Craftracy Raynor [phone] I was not provided with a name for her organization.

## 2017-06-21 NOTE — Progress Notes (Signed)
ANTICOAGULATION CONSULT NOTE - Follow Up Consult  Pharmacy Consult for IV heparin Indication: pulmonary embolus  No Known Allergies  Patient Measurements: Height: 5\' 11"  (180.3 cm) Weight: 241 lb 11.2 oz (109.6 kg) IBW/kg (Calculated) : 75.3 Heparin Dosing Weight: 85 kg  Vital Signs: Temp: 98.4 F (36.9 C) (12/27 1500) Temp Source: Oral (12/27 1500) BP: 152/82 (12/27 1500) Pulse Rate: 119 (12/27 1500)  Labs: Recent Labs    06/20/17 1910 06/21/17 0125 06/21/17 0642 06/21/17 0841 06/21/17 1508  HGB 13.4  --  11.9*  --   --   HCT 39.6  --  35.6*  --   --   PLT 448*  --  392  --   --   APTT  --  31  --   --   --   LABPROT  --  13.3  --   --   --   INR  --  1.02  --   --   --   HEPARINUNFRC  --   --  0.13*  --  0.20*  CREATININE 1.41*  --  1.08  --   --   TROPONINI  --   --  <0.03 <0.03  --     Estimated Creatinine Clearance: 120.2 mL/min (by C-G formula based on SCr of 1.08 mg/dL).   Medical History: Past Medical History:  Diagnosis Date  . Barrett's esophagus   . Depression   . GERD (gastroesophageal reflux disease)     Medications:  Scheduled:  . cholecalciferol  2,000 Units Oral Daily  . fenofibrate  160 mg Oral Daily  . [START ON 06/22/2017] Influenza vac split quadrivalent PF  0.5 mL Intramuscular Tomorrow-1000  . multivitamin with minerals  1 tablet Oral Daily  . pantoprazole  80 mg Oral Daily  . venlafaxine XR  225 mg Oral Daily   Infusions:  . ceFEPime (MAXIPIME) IV Stopped (06/21/17 1508)  . heparin 2,000 Units/hr (06/21/17 0933)  . methocarbamol (ROBAXIN)  IV Stopped (06/21/17 1234)  . vancomycin Stopped (06/21/17 1108)    Assessment: 36 yoM s/p shoulder surgery x1 week ago now with worsening back pain and SOB. + PE on CT. IV heparin for PE.  Baseline coag labs WNL.   Second heparin level subtherapeutic at 0.2 units/ml.  Infusion currently at 2000 units/hr. RN reports no issues  Goal of Therapy:  Heparin level 0.3-0.7 units/ml Monitor  platelets by anticoagulation protocol: Yes   Plan:  Heparin 1300 unit bolus x1 and increase rate of infusion to 2150 units/hr. Check HL in 6 hours Daily CBC/HL while on heparin infusion.  Arley Phenixllen Elijio Staples RPh 06/21/2017, 3:57 PM Pager [phone]

## 2017-06-21 NOTE — H&P (Addendum)
History and Physical    Harold Collins WUJ:[id number] DOB: Feb 02, 1981 DOA: 06/20/2017  PCP: Claretta Fraise, DO  Patient coming from: Home  I have personally briefly reviewed patient's old medical records in [facility]  Chief Complaint: CP, SOB  HPI: Harold Collins is a 36 y.o. male with medical history significant of recent surgery on shoulder about a week ago following shoulder dislocation due to electrocution injury.  Patient presents to the ED with c/o SOB and R scapular pain.  Sent in from orthopedist at [facility] due to concerns of possible PE.  Per patient he had onset of SOB while sitting down yesterday.  Some R scapular pain which has progressively worsened since onset, is now severe.  Refractory to home percocet.  No noted unilateral swelling.   ED Course: Had single episode of hemoptysis on presentation to ED.  CTA chest shows RLL> LLL consolidation, HCAP vs pulmonary infarct.  See their read regarding possible PE.   Review of Systems: As per HPI otherwise 10 point review of systems negative.   Past Medical History:  Diagnosis Date  . Barrett's esophagus   . Depression   . GERD (gastroesophageal reflux disease)     Past Surgical History:  Procedure Laterality Date  . SHOULDER SURGERY    . WRIST SURGERY       has no tobacco, alcohol, and drug history on file.  No Known Allergies  No family history on file.  No recent illness in family. Although did have a friend earlier this year diagnosed with TB.   Prior to Admission medications   Medication Sig Start Date End Date Taking? Authorizing Provider  Cholecalciferol (VITAMIN D) 2000 units tablet Take 2,000 Units by mouth daily.   Yes [provider]  diazepam (VALIUM) 5 MG tablet Take 5 mg by mouth every 6 (six) hours as needed for muscle spasms.  06/12/17  Yes [provider]  fenofibrate 160 MG tablet Take 160 mg by mouth daily.  06/11/17  Yes [provider]  Multiple  Vitamins-Minerals (MULTIVITAMIN ADULTS) TABS Take 1 tablet by mouth daily.   Yes [provider]  naproxen (NAPROSYN) 500 MG tablet Take 500 mg by mouth 2 (two) times daily with a meal.   Yes [provider]  omeprazole (PRILOSEC) 40 MG capsule Take 40 mg by mouth daily.  05/15/17  Yes [provider]  oxyCODONE-acetaminophen (PERCOCET/ROXICET) 5-325 MG tablet Take 1 tablet by mouth every 4 (four) hours as needed for moderate pain or severe pain.   Yes [provider]  Venlafaxine HCl 225 MG TB24 Take 1 tablet by mouth daily.  05/10/17  Yes [provider]  ondansetron (ZOFRAN) 4 MG tablet Take 4 mg by mouth every 8 (eight) hours as needed for nausea or vomiting.  06/12/17   [provider]    Physical Exam: Vitals:   06/20/17 2210 06/20/17 2332 06/21/17 0036 06/21/17 0146  BP:  133/89 (!) 150/89 (!) 135/97  Pulse:  (!) 106 (!) 108 (!) 103  Resp:  (!) 25 18 12   Temp:      TempSrc:      SpO2:  94% 92% 97%  Weight: 109.6 kg (241 lb 11.2 oz)     Height: 5\' 11"  (1.803 m)       Constitutional: NAD, calm, comfortable Eyes: PERRL, lids and conjunctivae normal ENMT: Mucous membranes are moist. Posterior pharynx clear of any exudate or lesions.Normal dentition.  Neck: normal, supple, no masses, no thyromegaly Respiratory: clear  to auscultation bilaterally, no wheezing, no crackles. Normal respiratory effort. No accessory muscle use.  Cardiovascular: Regular rate and rhythm, no murmurs / rubs / gallops. No extremity edema. 2+ pedal pulses. No carotid bruits.  Abdomen: no tenderness, no masses palpated. No hepatosplenomegaly. Bowel sounds positive.  Musculoskeletal: L arm in sling, pain not reproduced with palpation of R shoulder or scapula, nor movement of R shoulder patient unable to lay down due to pain however. Skin: no rashes, lesions, ulcers. No induration Neurologic: CN 2-12 grossly intact. Sensation intact, DTR normal. Strength 5/5 in  all 4.  Psychiatric: Normal judgment and insight. Alert and oriented x 3. Normal mood.    Labs on Admission: I have personally reviewed following labs and imaging studies  CBC: Recent Labs  Lab 06/20/17 1910  WBC 13.3*  HGB 13.4  HCT 39.6  MCV 92.7  PLT 448*   Basic Metabolic Panel: Recent Labs  Lab 06/20/17 1910  NA 135  K 4.6  CL 100*  CO2 26  GLUCOSE 215*  BUN 17  CREATININE 1.41*  CALCIUM 9.5   GFR: Estimated Creatinine Clearance: 92.1 mL/min (A) (by C-G formula based on SCr of 1.41 mg/dL (H)). Liver Function Tests: No results for input(s): AST, ALT, ALKPHOS, BILITOT, PROT, ALBUMIN in the last 168 hours. No results for input(s): LIPASE, AMYLASE in the last 168 hours. No results for input(s): AMMONIA in the last 168 hours. Coagulation Profile: Recent Labs  Lab 06/21/17 0125  INR 1.02   Cardiac Enzymes: No results for input(s): CKTOTAL, CKMB, CKMBINDEX, TROPONINI in the last 168 hours. BNP (last 3 results) No results for input(s): PROBNP in the last 8760 hours. HbA1C: No results for input(s): HGBA1C in the last 72 hours. CBG: No results for input(s): GLUCAP in the last 168 hours. Lipid Profile: No results for input(s): CHOL, HDL, LDLCALC, TRIG, CHOLHDL, LDLDIRECT in the last 72 hours. Thyroid Function Tests: No results for input(s): TSH, T4TOTAL, FREET4, T3FREE, THYROIDAB in the last 72 hours. Anemia Panel: No results for input(s): VITAMINB12, FOLATE, FERRITIN, TIBC, IRON, RETICCTPCT in the last 72 hours. Urine analysis: No results found for: COLORURINE, APPEARANCEUR, LABSPEC, PHURINE, GLUCOSEU, HGBUR, BILIRUBINUR, KETONESUR, PROTEINUR, UROBILINOGEN, NITRITE, LEUKOCYTESUR  Radiological Exams on Admission: Dg Chest 2 View  Result Date: 06/20/2017 CLINICAL DATA:  Shortness of breath and back pain after shoulder surgery 1 week ago. EXAM: CHEST  2 VIEW COMPARISON:  None. FINDINGS: Limited lateral radiograph as patient is unable to elevate the arm.  Cardiomediastinal silhouette is unremarkable for this low inspiratory examination with crowded vasculature markings. Patchy bibasilar airspace opacities. Trachea projects midline and there is no pneumothorax. Included soft tissue planes and osseous structures are non-suspicious. IMPRESSION: Patchy bibasilar atelectasis, less likely pneumonia in this low inspiratory examination. Electronically Signed   By: Awilda Metroourtnay  Bloomer M.D.   On: 06/20/2017 19:44   Ct Angio Chest Pe W/cm &/or Wo Cm  Addendum Date: 06/21/2017   ADDENDUM REPORT: 06/21/2017 00:52 ADDENDUM: Repeat CTA images were obtained due to motion. Additional 80 mL Isovue 370 intravenous was administered. Nonocclusive filling defects are present within the right upper lobe lobar, and segmental branches, consistent with acute pulmonary emboli. Evaluation of the right lower lobe branches are still somewhat limited by motion. RV LV ratio is normal. Critical Value/emergent results were called by telephone at the time of interpretation on 06/21/2017 at 12:51 am to Dr. Dahlia ClientHannah who assumed care of the patient for [facility] , who verbally acknowledged these results. Electronically Signed   By: Selena BattenKim  Jake Samples M.D.   On: 06/21/2017 00:52   Addendum Date: 06/20/2017   ADDENDUM REPORT: 06/20/2017 21:43 ADDENDUM: Findings reviewed with the ordering physician, physician assistant Dayton Scrape, who tells me that the clinical symptoms are highly worrisome for pulmonary embolism. Again, characterization of the pulmonary artery branches is significantly limited by extensive breathing motion artifact. However, the segmental pulmonary artery branches to the right lower lobe are asymmetrically prominent in size which could be a secondary sign of obstructing pulmonary embolism. Also, the dense consolidation at the right lung base could be sequela of associated pulmonary infarction. Per this discussion, ordering physician will hydrate the patient, confirm adequate renal function,  provide additional pain medications and then repeat the CT angiogram for hopefully better characterization of the lower lobe pulmonary arteries. Electronically Signed   By: Bary Richard M.D.   On: 06/20/2017 21:43   Result Date: 06/21/2017 CLINICAL DATA:  Shoulder surgery on December 18th, back pain worsening since surgery. Shortness of breath yesterday. Rule out pulmonary embolism. EXAM: CT ANGIOGRAPHY CHEST WITH CONTRAST TECHNIQUE: Multidetector CT imaging of the chest was performed using the standard protocol during bolus administration of intravenous contrast. Multiplanar CT image reconstructions and MIPs were obtained to evaluate the vascular anatomy. CONTRAST:  ISOVUE-370 IOPAMIDOL (ISOVUE-370) INJECTION 76% COMPARISON:  None. FINDINGS: Cardiovascular: Study is significantly limited by patient breathing motion artifact. There is no large obstructing pulmonary embolism identified within the main or central lobar pulmonary arteries. I cannot confidently characterize the more peripheral segmental and subsegmental pulmonary arteries due to the motion artifact. Heart size is normal. No pericardial effusion. No aortic aneurysm or evidence of aortic dissection. Mediastinum/Nodes: No mass or enlarged lymph nodes seen within the mediastinum or perihilar regions. Esophagus is unremarkable. Trachea and central bronchi are unremarkable. Lungs/Pleura: Dense consolidation within the right lower lobe, pneumonia versus aspiration. Smaller consolidation at the left lung base. Trace left pleural effusion. Upper lungs are clear. Upper Abdomen: No acute findings. Musculoskeletal: Mild degenerative spurring within the lower thoracic spine. No acute or suspicious osseous finding. Review of the MIP images confirms the above findings. IMPRESSION: 1. Dense consolidation within the right lower lobe, consistent with pneumonia or aspiration. 2. Smaller consolidation at the left lung base, compatible with atelectasis or  aspiration. Associated trace left pleural effusion. 3. No central obstructing pulmonary embolism. The majority of the pulmonary artery branches, however, cannot be confidently characterized due to patient breathing motion artifact. Electronically Signed: By: Bary Richard M.D. On: 06/20/2017 20:57    EKG: Independently reviewed.  Assessment/Plan Principal Problem:   Hemoptysis Active Problems:   HCAP (healthcare-associated pneumonia)   Pulmonary embolus and infarction (HCC)    1. Hemoptysis - 1. DDx includes HCAP / asp PNA from surgery, vs PE with infarction (see below for management of each) 2. Will get a Quantiferon as well as I see that he had probable exposure to TB earlier this year (Reviewed PCP note), although the CT findings certainly dont look like MTB 3. Pain control with PRN dilaudid 4. Tele monitor for tachycardia 2. Possible HCAP - 1. PNA pathway 2. Cultures pending 3. Cefepime / vanc 4. Procalcitonin pending 3. Possible PE and infarction - 1. Heparin gtt 2. 2d echo 3. BLE venous duplex  DVT prophylaxis: Heparin gtt Code Status: Full Family Communication: No family in room Disposition Plan: Home after admit Consults called: None Admission status: Admit to inpatient - inpatient status for IV pain control   GARDNER, JARED M. DO [facility] Pager [phone]  If 7AM-7PM,  please contact day team taking care of patient [email or web address] Password [email or web address]  06/21/2017, 2:36 AM

## 2017-06-21 NOTE — Progress Notes (Signed)
Patient now requiring 4.5L O2 per nasal cannula, and is at 93%. Two more instances of hymoptysis with small to moderate amount of dark red to maroon sputum. Left lung sounds include inspiratory and expiratory wheezes. MD Alekh notified, no new orders given. Will continue to monitor.

## 2017-06-21 NOTE — Progress Notes (Signed)
ANTICOAGULATION CONSULT NOTE - Initial Consult  Pharmacy Consult for IV heparin Indication: pulmonary embolus  No Known Allergies  Patient Measurements: Height: 5\' 11"  (180.3 cm) Weight: 241 lb 11.2 oz (109.6 kg) IBW/kg (Calculated) : 75.3 Heparin Dosing Weight: 85 kg  Vital Signs: Temp: 98.2 F (36.8 C) (12/26 1819) Temp Source: Oral (12/26 1819) BP: 150/89 (12/27 0036) Pulse Rate: 108 (12/27 0036)  Labs: Recent Labs    06/20/17 1910  HGB 13.4  HCT 39.6  PLT 448*  CREATININE 1.41*    Estimated Creatinine Clearance: 92.1 mL/min (A) (by C-G formula based on SCr of 1.41 mg/dL (H)).   Medical History: Past Medical History:  Diagnosis Date  . GERD (gastroesophageal reflux disease)     Medications:  Scheduled:  . iopamidol       Infusions:  . vancomycin 2,500 mg (06/20/17 2330)    Assessment: 36 yoM s/p shoulder surgery x1 week ago now with worsening back pain and SOB. + PE on CT. IV heparin for PE.  Baseline coags pending Goal of Therapy:  Heparin level 0.3-0.7 units/ml Monitor platelets by anticoagulation protocol: Yes   Plan:  Baseline aptt and PT/INR STAT Heparin 4000 unit bolus x1  Start drip at 1700 units/hr Daily CBC/HL Check 1st HL in 6 hours  Lorenza EvangelistGreen, Lexander Tremblay R 06/21/2017,1:05 AM

## 2017-06-22 ENCOUNTER — Inpatient Hospital Stay (HOSPITAL_COMMUNITY): Payer: Worker's Compensation

## 2017-06-22 DIAGNOSIS — I2699 Other pulmonary embolism without acute cor pulmonale: Secondary | ICD-10-CM

## 2017-06-22 LAB — MRSA PCR SCREENING: MRSA by PCR: NEGATIVE

## 2017-06-22 LAB — BASIC METABOLIC PANEL
ANION GAP: 9 (ref 5–15)
BUN: 13 mg/dL (ref 6–20)
CALCIUM: 9 mg/dL (ref 8.9–10.3)
CO2: 28 mmol/L (ref 22–32)
Chloride: 96 mmol/L — ABNORMAL LOW (ref 101–111)
Creatinine, Ser: 0.92 mg/dL (ref 0.61–1.24)
GFR calc Af Amer: >60 mL/min (ref >60)
GLUCOSE: 140 mg/dL — AB (ref 65–99)
Potassium: 4.4 mmol/L (ref 3.5–5.1)
Sodium: 133 mmol/L — ABNORMAL LOW (ref 135–145)

## 2017-06-22 LAB — CBC
HCT: 33.8 % — ABNORMAL LOW (ref 39.0–52.0)
Hemoglobin: 11.2 g/dL — ABNORMAL LOW (ref 13.0–17.0)
MCH: 30.4 pg (ref 26.0–34.0)
MCHC: 33.1 g/dL (ref 30.0–36.0)
MCV: 91.8 fL (ref 78.0–100.0)
Platelets: 437 10*3/uL — ABNORMAL HIGH (ref 150–400)
RBC: 3.68 MIL/uL — ABNORMAL LOW (ref 4.22–5.81)
RDW: 12.9 % (ref 11.5–15.5)
WBC: 13 10*3/uL — AB (ref 4.0–10.5)

## 2017-06-22 LAB — HEPARIN LEVEL (UNFRACTIONATED)
Heparin Unfractionated: 0.39 IU/mL (ref 0.30–0.70)
Heparin Unfractionated: 0.5 IU/mL (ref 0.30–0.70)

## 2017-06-22 LAB — MAGNESIUM: Magnesium: 1.8 mg/dL (ref 1.7–2.4)

## 2017-06-22 MED ORDER — HEPARIN (PORCINE) IN NACL 100-0.45 UNIT/ML-% IJ SOLN
2700.0000 [IU]/h | INTRAMUSCULAR | Status: DC
  Administered 2017-06-22: 2700 [IU]/h via INTRAVENOUS
  Filled 2017-06-22: qty 250

## 2017-06-22 MED ORDER — PROCHLORPERAZINE EDISYLATE 5 MG/ML IJ SOLN
10.0000 mg | Freq: Once | INTRAMUSCULAR | Status: AC
  Administered 2017-06-22: 10 mg via INTRAVENOUS
  Filled 2017-06-22: qty 2

## 2017-06-22 MED ORDER — RIVAROXABAN (XARELTO) EDUCATION KIT FOR DVT/PE PATIENTS
PACK | Freq: Once | Status: AC
  Administered 2017-06-22: 17:00:00
  Filled 2017-06-22: qty 1

## 2017-06-22 MED ORDER — ONDANSETRON HCL 4 MG/2ML IJ SOLN
4.0000 mg | Freq: Four times a day (QID) | INTRAMUSCULAR | Status: DC | PRN
  Administered 2017-06-22 – 2017-06-23 (×4): 4 mg via INTRAVENOUS
  Filled 2017-06-22 (×4): qty 2

## 2017-06-22 MED ORDER — RIVAROXABAN 15 MG PO TABS
15.0000 mg | ORAL_TABLET | Freq: Two times a day (BID) | ORAL | Status: DC
  Administered 2017-06-22 – 2017-06-24 (×4): 15 mg via ORAL
  Filled 2017-06-22 (×5): qty 1

## 2017-06-22 NOTE — Progress Notes (Addendum)
ANTICOAGULATION CONSULT NOTE - Follow Up Consult  Pharmacy Consult for IV heparin >> rivaroxaban Indication: pulmonary embolus  No Known Allergies  Patient Measurements: Height: 5\' 11"  (180.3 cm) Weight: 241 lb 11.2 oz (109.6 kg) IBW/kg (Calculated) : 75.3 Heparin Dosing Weight: 85 kg  Vital Signs: Temp: 97.8 F (36.6 C) (12/28 0650) Temp Source: Oral (12/28 0650) BP: 129/80 (12/28 0650) Pulse Rate: 97 (12/28 0650)  Labs: Recent Labs    06/20/17 1910 06/21/17 0125  06/21/17 [id number] 06/21/17 0841 06/21/17 1508 06/21/17 2153 06/22/17 0540 06/22/17 1301  HGB 13.4  --   --  11.9*  --   --   --  11.2*  --   HCT 39.6  --   --  35.6*  --   --   --  33.8*  --   PLT 448*  --   --  392  --   --   --  437*  --   APTT  --  31  --   --   --   --   --   --   --   LABPROT  --  13.3  --   --   --   --   --   --   --   INR  --  1.02  --   --   --   --   --   --   --   HEPARINUNFRC  --   --    < > 0.13*  --  0.20* 0.12* 0.39 0.50  CREATININE 1.41*  --   --  1.08  --   --   --  0.92  --   TROPONINI  --   --   --  <0.03 <0.03 <0.03  --   --   --    < > = values in this interval not displayed.    Estimated Creatinine Clearance: 141.1 mL/min (by C-G formula based on SCr of 0.92 mg/dL).   Medical History: Past Medical History:  Diagnosis Date  . Barrett's esophagus   . Depression   . GERD (gastroesophageal reflux disease)     Medications:  Scheduled:  . cholecalciferol  2,000 Units Oral Daily  . fenofibrate  160 mg Oral Daily  . Influenza vac split quadrivalent PF  0.5 mL Intramuscular Tomorrow-1000  . multivitamin with minerals  1 tablet Oral Daily  . pantoprazole  80 mg Oral Daily  . rivaroxaban   Does not apply Once  . rivaroxaban  15 mg Oral BID WC  . venlafaxine XR  225 mg Oral Daily   Infusions:  . ceFEPime (MAXIPIME) IV Stopped (06/22/17 0931)  . methocarbamol (ROBAXIN)  IV 1,000 mg (06/22/17 0550)    Assessment: 36 yoM s/p shoulder surgery x1 week ago now with  worsening back pain and SOB. + PE on CT. IV heparin for PE.  Baseline coag labs WNL.  Today, 06/22/17  Pt is being transitioned from heparin to rivaroxaban today   Hgb 11.2, plt 437   SCr 0.92, CrCl > 100 ml/min  No bleeding issues noted     Goal of Therapy:  Heparin level 0.3-0.7 units/ml Monitor platelets by anticoagulation protocol: Yes   Plan:   Stop heparin infusion and start rivaroxaban 15 mg PO BID with meals for 21 days.  After 21 days transition to rivaroxaban 20 mg PO daily with supper  Monitor for signs and symptoms of bleeding  Patient education complete   Adalberto ColeNikola Dannetta Lekas, PharmD, BCPS Pager [phone]  06/22/2017 2:30 PM

## 2017-06-22 NOTE — Progress Notes (Signed)
*  PRELIMINARY RESULTS* Vascular Ultrasound Bilateral lower extremity venous duplex has been completed.  Preliminary findings: No evidence of deep vein thrombosis or baker's cysts bilaterally.  Chauncey FischerCharlotte C Geraldine Tesar 06/22/2017, 9:21 AM

## 2017-06-22 NOTE — Progress Notes (Signed)
ANTICOAGULATION CONSULT NOTE - Follow Up Consult  Pharmacy Consult for IV heparin Indication: pulmonary embolus  No Known Allergies  Patient Measurements: Height: 5\' 11"  (180.3 cm) Weight: 241 lb 11.2 oz (109.6 kg) IBW/kg (Calculated) : 75.3 Heparin Dosing Weight: 85 kg  Vital Signs: Temp: 97.5 F (36.4 C) (12/27 2240) Temp Source: Oral (12/27 2240) BP: 162/92 (12/27 2240) Pulse Rate: 110 (12/27 2240)  Labs: Recent Labs    06/20/17 1910 06/21/17 0125  06/21/17 [id number] 06/21/17 0841 06/21/17 1508 06/21/17 2153 06/22/17 0540  HGB 13.4  --   --  11.9*  --   --   --  11.2*  HCT 39.6  --   --  35.6*  --   --   --  33.8*  PLT 448*  --   --  392  --   --   --  437*  APTT  --  31  --   --   --   --   --   --   LABPROT  --  13.3  --   --   --   --   --   --   INR  --  1.02  --   --   --   --   --   --   HEPARINUNFRC  --   --    < > 0.13*  --  0.20* 0.12* 0.39  CREATININE 1.41*  --   --  1.08  --   --   --  0.92  TROPONINI  --   --   --  <0.03 <0.03 <0.03  --   --    < > = values in this interval not displayed.    Estimated Creatinine Clearance: 141.1 mL/min (by C-G formula based on SCr of 0.92 mg/dL).   Medical History: Past Medical History:  Diagnosis Date  . Barrett's esophagus   . Depression   . GERD (gastroesophageal reflux disease)     Medications:  Scheduled:  . cholecalciferol  2,000 Units Oral Daily  . fenofibrate  160 mg Oral Daily  . Influenza vac split quadrivalent PF  0.5 mL Intramuscular Tomorrow-1000  . multivitamin with minerals  1 tablet Oral Daily  . pantoprazole  80 mg Oral Daily  . prochlorperazine  10 mg Intravenous Once  . venlafaxine XR  225 mg Oral Daily   Infusions:  . ceFEPime (MAXIPIME) IV Stopped (06/21/17 2349)  . heparin    . methocarbamol (ROBAXIN)  IV 1,000 mg (06/22/17 0550)  . vancomycin Stopped (06/21/17 2251)    Assessment: 36 yoM s/p shoulder surgery x1 week ago now with worsening back pain and SOB. + PE on CT. IV heparin  for PE.  Baseline coag labs WNL.   2153 HL=0.12 on 2150 units/hr, still below goal, no infusion issues, pt with hymoptysis per RN.  00540 HL=0.39 on 2600 units/hr at goal!, no infusion issues, RN reports vomited x2 with red streaks  Goal of Therapy:  Heparin level 0.3-0.7 units/ml Monitor platelets by anticoagulation protocol: Yes   Plan:  Increase heparin drip to 2700 units/hr to ensure stays in therapeutic range as bolus wears off Check HL in 6 hours Daily CBC/HL while on heparin infusion.  Lorenza EvangelistGreen, Yoan Sallade R 06/22/2017, 6:28 AM

## 2017-06-22 NOTE — Progress Notes (Signed)
PROGRESS NOTE    Harold Collins  NWG:[id number] DOB: March 04, 1981 DOA: 06/20/2017 PCP: Claretta Fraise, DO     Brief Narrative:  Harold Collins is a 36 y.o. male with medical history significant of recent surgery on left shoulder about a week ago following shoulder dislocation due to electrocution injury at work. Patient now presents to the ED with complaints of SOB and R scapular pain, hemoptysis.  He was sent in from orthopedist at [facility] due to concerns of possible PE. CTA chest revealed right lower lobe pneumonia as well as PE.   Assessment & Plan:   Principal Problem:   Hemoptysis Active Problems:   HCAP (healthcare-associated pneumonia)   Pulmonary embolus and infarction (HCC)   Right lower lobar pneumonia, HCAP -MRSA PCR negative, discontinue vancomycin -Dr. Hanley Ben discussed case with infection control as well as Dr. Craige Cotta and discontinued TB rule out due to low suspicion -HIV NR, strep pneumo negative  -Continue cefepime -Sputum culture pending, blood culture pending   Pulmonary embolism -Continue heparin drip, discussed with case manager regarding Xarelto coverage on Worker's Comp.  Pharmacy consulted for Xarelto education and dosing -Venous duplex negative for DVT  Right sided pleuritic back pain -Sharp pain worse with deep breaths and cough, not reproducible to palpation -Still requiring IV pain medication every 2-4 hours prn   Recent left shoulder surgery -Orthopedic surgery evaluated patient. Continue sling immobilization to LUE, may position arm for comfort when sitting in chair, may support with pillow, apply ice PRN. Follow up in clinic 1 month with Dr. Rennis Chris   DVT prophylaxis: heparin gtt --> Xarelto Code Status: Full Family Communication: At bedside Disposition Plan: Pending improvement   Consultants:   None  Procedures:   None   Antimicrobials:  Anti-infectives (From admission, onward)   Start     Dose/Rate Route Frequency Ordered Stop   06/21/17 1000  vancomycin (VANCOCIN) IVPB 1000 mg/200 mL premix  Status:  Discontinued     1,000 mg 200 mL/hr over 60 Minutes Intravenous Every 12 hours 06/21/17 0222 06/22/17 1329   06/21/17 0600  ceFEPIme (MAXIPIME) 1 g in dextrose 5 % 50 mL IVPB     1 g 100 mL/hr over 30 Minutes Intravenous Every 8 hours 06/21/17 0221     06/20/17 2230  vancomycin (VANCOCIN) 2,500 mg in sodium chloride 0.9 % 500 mL IVPB     2,500 mg 250 mL/hr over 120 Minutes Intravenous  Once 06/20/17 2218 06/21/17 0305   06/20/17 2200  ceFEPIme (MAXIPIME) 1 g in dextrose 5 % 50 mL IVPB     1 g 100 mL/hr over 30 Minutes Intravenous  Once 06/20/17 2156 06/20/17 2247       Subjective: Continues to have right sided back pain around T 7-8.  No other new complaints aside from pain.  Objective: Vitals:   06/21/17 1727 06/21/17 1939 06/21/17 2240 06/22/17 0650  BP:   (!) 162/92 129/80  Pulse:  (!) 114 (!) 110 97  Resp:   (!) 21 20  Temp:   (!) 97.5 F (36.4 C) 97.8 F (36.6 C)  TempSrc:   Oral Oral  SpO2: 93% 91% 91% 93%  Weight:      Height:        Intake/Output Summary (Last 24 hours) at 06/22/2017 1338 Last data filed at 06/22/2017 1000 Gross per 24 hour  Intake 912.7 ml  Output 1950 ml  Net -1037.3 ml   Filed Weights   06/20/17 2210  Weight: 109.6 kg (241 lb 11.2  oz)    Examination:  General exam: Appears calm but uncomfortable  Respiratory system: Clear to auscultation, right lower lobe diminished. Respiratory effort normal, tachypneic  Cardiovascular system: S1 & S2 heard, RRR. No JVD, murmurs, rubs, gallops or clicks. No pedal edema. Gastrointestinal system: Abdomen is nondistended, soft and nontender. No organomegaly or masses felt. Normal bowel sounds heard. Central nervous system: Alert and oriented. No focal neurological deficits. Extremities: Symmetric 5 x 5 power. MSK: No TTP thoracic spine  Skin: No rashes, lesions or ulcers Psychiatry: Judgement and insight appear normal. Mood &  affect appropriate.   Data Reviewed: I have personally reviewed following labs and imaging studies  CBC: Recent Labs  Lab 06/20/17 1910 06/21/17 0642 06/22/17 0540  WBC 13.3* 9.1 13.0*  HGB 13.4 11.9* 11.2*  HCT 39.6 35.6* 33.8*  MCV 92.7 92.7 91.8  PLT 448* 392 437*   Basic Metabolic Panel: Recent Labs  Lab 06/20/17 1910 06/21/17 0642 06/22/17 0540  NA 135 135 133*  K 4.6 4.3 4.4  CL 100* 101 96*  CO2 26 25 28   GLUCOSE 215* 108* 140*  BUN 17 14 13   CREATININE 1.41* 1.08 0.92  CALCIUM 9.5 8.7* 9.0  MG  --   --  1.8   GFR: Estimated Creatinine Clearance: 141.1 mL/min (by C-G formula based on SCr of 0.92 mg/dL). Liver Function Tests: Recent Labs  Lab 06/21/17 0642  AST 14*  ALT 25  ALKPHOS 53  BILITOT 0.8  PROT 7.5  ALBUMIN 4.0   No results for input(s): LIPASE, AMYLASE in the last 168 hours. No results for input(s): AMMONIA in the last 168 hours. Coagulation Profile: Recent Labs  Lab 06/21/17 0125  INR 1.02   Cardiac Enzymes: Recent Labs  Lab 06/21/17 0642 06/21/17 0841 06/21/17 1508  TROPONINI <0.03 <0.03 <0.03   BNP (last 3 results) No results for input(s): PROBNP in the last 8760 hours. HbA1C: No results for input(s): HGBA1C in the last 72 hours. CBG: No results for input(s): GLUCAP in the last 168 hours. Lipid Profile: No results for input(s): CHOL, HDL, LDLCALC, TRIG, CHOLHDL, LDLDIRECT in the last 72 hours. Thyroid Function Tests: No results for input(s): TSH, T4TOTAL, FREET4, T3FREE, THYROIDAB in the last 72 hours. Anemia Panel: No results for input(s): VITAMINB12, FOLATE, FERRITIN, TIBC, IRON, RETICCTPCT in the last 72 hours. Sepsis Labs: Recent Labs  Lab 06/20/17 2213 06/21/17 0642  PROCALCITON  --  0.11  LATICACIDVEN 1.6  1.3  --     Recent Results (from the past 240 hour(s))  Culture, sputum-assessment     Status: None   Collection Time: 06/21/17  3:00 PM  Result Value Ref Range Status   Specimen Description EXPECTORATED  SPUTUM  Final   Special Requests NONE  Final   Sputum evaluation THIS SPECIMEN IS ACCEPTABLE FOR SPUTUM CULTURE  Final   Report Status 06/21/2017 FINAL  Final  Culture, respiratory (NON-Expectorated)     Status: None (Preliminary result)   Collection Time: 06/21/17  3:00 PM  Result Value Ref Range Status   Specimen Description EXPECTORATED SPUTUM  Final   Special Requests NONE Reflexed from [id number]  Final   Gram Stain   Final    ABUNDANT WBC PRESENT,BOTH PMN AND MONONUCLEAR RARE SQUAMOUS EPITHELIAL CELLS PRESENT FEW GRAM NEGATIVE COCCI IN PAIRS RARE GRAM NEGATIVE RODS RARE GRAM POSITIVE COCCI IN CLUSTERS RARE GRAM POSITIVE RODS    Culture   Final    CULTURE REINCUBATED FOR BETTER GROWTH Performed at [facility],  [address] [place], [place] [id number]    Report Status PENDING  Incomplete  MRSA PCR Screening     Status: None   Collection Time: 06/22/17  8:14 AM  Result Value Ref Range Status   MRSA by PCR NEGATIVE NEGATIVE Final    Comment:        The GeneXpert MRSA Assay (FDA approved for NASAL specimens only), is one component of a comprehensive MRSA colonization surveillance program. It is not intended to diagnose MRSA infection nor to guide or monitor treatment for MRSA infections.        Radiology Studies: Dg Chest 2 View  Result Date: 06/20/2017 CLINICAL DATA:  Shortness of breath and back pain after shoulder surgery 1 week ago. EXAM: CHEST  2 VIEW COMPARISON:  None. FINDINGS: Limited lateral radiograph as patient is unable to elevate the arm. Cardiomediastinal silhouette is unremarkable for this low inspiratory examination with crowded vasculature markings. Patchy bibasilar airspace opacities. Trachea projects midline and there is no pneumothorax. Included soft tissue planes and osseous structures are non-suspicious. IMPRESSION: Patchy bibasilar atelectasis, less likely pneumonia in this low inspiratory examination. Electronically Signed   By: Awilda Metroourtnay   Bloomer M.D.   On: 06/20/2017 19:44   Ct Angio Chest Pe W/cm &/or Wo Cm  Addendum Date: 06/21/2017   ADDENDUM REPORT: 06/21/2017 00:52 ADDENDUM: Repeat CTA images were obtained due to motion. Additional 80 mL Isovue 370 intravenous was administered. Nonocclusive filling defects are present within the right upper lobe lobar, and segmental branches, consistent with acute pulmonary emboli. Evaluation of the right lower lobe branches are still somewhat limited by motion. RV LV ratio is normal. Critical Value/emergent results were called by telephone at the time of interpretation on 06/21/2017 at 12:51 am to Dr. Dahlia ClientHannah who assumed care of the patient for [facility]URRAY , who verbally acknowledged these results. Electronically Signed   By: Jasmine PangKim  Fujinaga M.D.   On: 06/21/2017 00:52   Addendum Date: 06/20/2017   ADDENDUM REPORT: 06/20/2017 21:43 ADDENDUM: Findings reviewed with the ordering physician, physician assistant Dayton ScrapeMurray, who tells me that the clinical symptoms are highly worrisome for pulmonary embolism. Again, characterization of the pulmonary artery branches is significantly limited by extensive breathing motion artifact. However, the segmental pulmonary artery branches to the right lower lobe are asymmetrically prominent in size which could be a secondary sign of obstructing pulmonary embolism. Also, the dense consolidation at the right lung base could be sequela of associated pulmonary infarction. Per this discussion, ordering physician will hydrate the patient, confirm adequate renal function, provide additional pain medications and then repeat the CT angiogram for hopefully better characterization of the lower lobe pulmonary arteries. Electronically Signed   By: Bary RichardStan  Maynard M.D.   On: 06/20/2017 21:43   Result Date: 06/21/2017 CLINICAL DATA:  Shoulder surgery on December 18th, back pain worsening since surgery. Shortness of breath yesterday. Rule out pulmonary embolism. EXAM: CT ANGIOGRAPHY CHEST  WITH CONTRAST TECHNIQUE: Multidetector CT imaging of the chest was performed using the standard protocol during bolus administration of intravenous contrast. Multiplanar CT image reconstructions and MIPs were obtained to evaluate the vascular anatomy. CONTRAST:  100mL ISOVUE-370 IOPAMIDOL (ISOVUE-370) INJECTION 76% COMPARISON:  None. FINDINGS: Cardiovascular: Study is significantly limited by patient breathing motion artifact. There is no large obstructing pulmonary embolism identified within the main or central lobar pulmonary arteries. I cannot confidently characterize the more peripheral segmental and subsegmental pulmonary arteries due to the motion artifact. Heart size is normal. No pericardial effusion. No aortic aneurysm or  evidence of aortic dissection. Mediastinum/Nodes: No mass or enlarged lymph nodes seen within the mediastinum or perihilar regions. Esophagus is unremarkable. Trachea and central bronchi are unremarkable. Lungs/Pleura: Dense consolidation within the right lower lobe, pneumonia versus aspiration. Smaller consolidation at the left lung base. Trace left pleural effusion. Upper lungs are clear. Upper Abdomen: No acute findings. Musculoskeletal: Mild degenerative spurring within the lower thoracic spine. No acute or suspicious osseous finding. Review of the MIP images confirms the above findings. IMPRESSION: 1. Dense consolidation within the right lower lobe, consistent with pneumonia or aspiration. 2. Smaller consolidation at the left lung base, compatible with atelectasis or aspiration. Associated trace left pleural effusion. 3. No central obstructing pulmonary embolism. The majority of the pulmonary artery branches, however, cannot be confidently characterized due to patient breathing motion artifact. Electronically Signed: By: Bary Richard M.D. On: 06/20/2017 20:57      Scheduled Meds: . cholecalciferol  2,000 Units Oral Daily  . fenofibrate  160 mg Oral Daily  . Influenza vac  split quadrivalent PF  0.5 mL Intramuscular Tomorrow-1000  . multivitamin with minerals  1 tablet Oral Daily  . pantoprazole  80 mg Oral Daily  . rivaroxaban   Does not apply Once  . venlafaxine XR  225 mg Oral Daily   Continuous Infusions: . ceFEPime (MAXIPIME) IV Stopped (06/22/17 0931)  . heparin 2,700 Units/hr (06/22/17 0925)  . methocarbamol (ROBAXIN)  IV 1,000 mg (06/22/17 0550)     LOS: 1 day    Time spent: 40 minutes   Noralee Stain, DO [facility] [email or web address] Password [email or web address] 06/22/2017, 1:38 PM

## 2017-06-22 NOTE — Discharge Instructions (Addendum)
Information on my medicine - XARELTO (rivaroxaban)  This medication education was reviewed with me or my healthcare representative as part of my discharge preparation.  The pharmacist that spoke with me during my hospital stay was:  Glogovac,nikola, [facility]  WHY WAS XARELTO PRESCRIBED FOR YOU? Xarelto was prescribed to treat blood clots that may have been found in the veins of your legs (deep vein thrombosis) or in your lungs (pulmonary embolism) and to reduce the risk of them occurring again.  What do you need to know about Xarelto? The starting dose is one 15 mg tablet taken TWICE daily with food for the FIRST 21 DAYS then on (enter date)  07/12/17  the dose is changed to one 20 mg tablet taken ONCE A DAY with your evening meal.  DO NOT stop taking Xarelto without talking to the health care provider who prescribed the medication.  Refill your prescription for 20 mg tablets before you run out.  After discharge, you should have regular check-up appointments with your healthcare provider that is prescribing your Xarelto.  In the future your dose may need to be changed if your kidney function changes by a significant amount.  What do you do if you miss a dose? If you are taking Xarelto TWICE DAILY and you miss a dose, take it as soon as you remember. You may take two 15 mg tablets (total 30 mg) at the same time then resume your regularly scheduled 15 mg twice daily the next day.  If you are taking Xarelto ONCE DAILY and you miss a dose, take it as soon as you remember on the same day then continue your regularly scheduled once daily regimen the next day. Do not take two doses of Xarelto at the same time.   Important Safety Information Xarelto is a blood thinner medicine that can cause bleeding. You should call your healthcare provider right away if you experience any of the following: ? Bleeding from an injury or your nose that does not stop. ? Unusual colored urine (red or dark brown) or  unusual colored stools (red or black). ? Unusual bruising for unknown reasons. ? A serious fall or if you hit your head (even if there is no bleeding).  Some medicines may interact with Xarelto and might increase your risk of bleeding while on Xarelto. To help avoid this, consult your healthcare provider or pharmacist prior to using any new prescription or non-prescription medications, including herbals, vitamins, non-steroidal anti-inflammatory drugs (NSAIDs) and supplements.  This website has more information on Xarelto: VisitDestination.com.br.    Pulmonary Embolism A pulmonary embolism (PE) is a sudden blockage or decrease of blood flow in one lung or both lungs. Most blockages come from a blood clot that forms in a lower leg, thigh, or arm vein (deep vein thrombosis, DVT) and travels to the lungs. A clot is blood that has thickened into a gel or solid. PE is a dangerous and life-threatening condition that needs to be treated right away. What are the causes? This condition is usually caused by a blood clot that forms in a vein and moves to the lungs. In rare cases, it may be caused by air, fat, part of a tumor, or other tissue that moves through the veins and into the lungs. What increases the risk? The following factors may make you more likely to develop this condition:  Having DVT or a history of DVT.  Being older than age 40.  Personal or family history of blood clots or  blood clotting disease.  Major or lengthy surgery.  Orthopedic surgery, especially hip or knee replacement.  Traumatic injury, such as breaking a hip or leg.  Spinal cord injury.  Stroke.  Taking medicines that contain estrogen. These include birth control pills and hormone replacement therapy.  Long-term (chronic) lung or heart disease.  Cancer and chemotherapy.  Having a central venous catheter.  Pregnancy and the period after delivery.  What are the signs or symptoms? Symptoms of this condition  usually start suddenly and include:  Shortness of breath while active or at rest.  Coughing or coughing up blood or blood-tinged mucus.  Chest pain that is often worse with deep breaths.  Rapid or irregular heartbeat.  Feeling light-headed or dizzy.  Fainting.  Feeling anxious.  Sweating.  Pain and swelling in a leg. This is a symptom of DVT, which can lead to PE.  How is this diagnosed? This condition may be diagnosed based on:  Your medical history.  A physical exam.  Blood tests to check blood oxygen level and how well your blood clots, and a D-dimer blood test, which checks your blood for a substance that is released when a blood clot breaks apart.  CT pulmonary angiogram. This test checks blood flow in and around your lungs.  Ventilation-perfusion scan, also called a lung VQ scan. This test measures air flow and blood flow to the lungs.  Ultrasound of the legs to look for blood clots.  How is this treated? Treatment for this conditions depends on many factors, such as the cause of your PE, your risk for bleeding or developing more clots, and other medical conditions you have. Treatment aims to remove, dissolve, or stop blood clots from forming or growing larger. Treatment may include:  Blood thinning medicines (anticoagulants) to stop clots from forming or growing. These medicines may be given as a pill, as an injection, or through an IV tube (infusion).  Medicines that dissolve clots (thrombolytics).  A procedure in which a flexible tube is used to remove a blood clot (embolectomy) or deliver medicine to destroy it (catheter-directed thrombolysis).  A procedure in which a filter is inserted into a large vein that carries blood to the heart (inferior vena cava). This filter (vena cava filter) catches blood clots before they reach the lungs.  Surgery to remove the clot (surgical embolectomy). This is rare.  You may need a combination of immediate, long-term (up to  3 months after diagnosis), and extended (more than 3 months after diagnosis) treatments. Your treatment may continue for several months (maintenance therapy). You and your health care provider will work together to choose the treatment program that is best for you. Follow these instructions at home: If you are taking an anticoagulant medicine:  Take the medicine every day at the same time each day.  Understand what foods and drugs interact with your medicine.  Understand the side effects of this medicine, including excessive bruising or bleeding. Ask your health care provider or pharmacist about other side effects. General instructions  Take over-the-counter and prescription medicines only as told by your health care provider.  Anticoagulant medicines may cause side effects, including easy bruising and difficulty stopping bleeding. If you are prescribed an anticoagulant: ? Hold pressure over cuts for longer than usual. ? Tell your dentist and other health care providers that you are taking anticoagulants before you have any procedure that may cause bleeding. ? Avoid contact sports. ? Be extra careful when handling sharp objects. ? Use a  soft toothbrush. Floss with waxed dental floss. ? Shave with an Neurosurgeonelectric razor.  Wear a medical alert bracelet or carry a medical alert card that says you have had a PE.  Ask your health care provider when you may return to your normal activities.  Talk with your health care provider about any travel plans. It is important to make sure that you are still able to take your medicine while on trips.  Keep all follow-up visits as told by your health care provider. This is important. How is this prevented? Take these actions to lower your risk of developing another PE:  Exercise regularly. Take frequent walks. For at least 30 minutes every day, engage in: ? Activity that involves moving your arms and legs. ? Activity that encourages good blood flow through  your body by increasing your heart rate.  While traveling, drink plenty of water and avoid drinking alcohol. Ask your health care provider if you should wear below-the-knee compression stockings.  Avoid sitting or lying in bed for long periods of time without moving your legs. Exercise your arms and legs every hour during long-distance travel (over 4 hours).  If you are hospitalized or have surgery, ask your health care provider about your risks and what treatments can help prevent blood clots.  Maintain a healthy weight. Ask your health care provider what weight is healthy for you.  If you are a woman who is over age 35, avoid unnecessary use of medicines that contain estrogen, including birth control pills.  Do not use any products that contain nicotine or tobacco, such as cigarettes and e-cigarettes. This is especially important if you take estrogen medicines. If you need help quitting, ask your health care provider.  See your health care provider for regular checkups. This may include blood tests and ultrasound testing on your legs to check for new blood clots.  Contact a health care provider if:  You missed a dose of your blood thinner medicine. Get help right away if:  You have new or increased pain, swelling, warmth, or redness in an arm or leg.  You have numbness or tingling in an arm or leg.  You have shortness of breath while active or at rest.  You have chest pain.  You have a rapid or irregular heartbeat.  You feel light-headed or dizzy.  You cough up blood.  You have blood in your vomit, stool, or urine.  You have a fever.  You have abdomen (abdominal) pain.  You have a severe fall or head injury.  You have a severe headache.  You have vision changes.  You cannot move your arms or legs.  You are confused or have memory loss.  You are bleeding for 10 minutes or more, even with strong pressure on the wound. These symptoms may represent a serious problem  that is an emergency. Do not wait to see if the symptoms will go away. Get medical help right away. Call your local emergency services (911 in the [place] Do not drive yourself to the hospital. Summary  A pulmonary embolism (PE) is a sudden blockage or decrease of blood flow in one lung or both lungs. PE is a dangerous and life-threatening condition that needs to be treated right away.  Having deep vein thrombosis (DVT) or a history of DVT is the most common risk factor for PE.  Treatments for this condition usually include medicines to thin your blood (anticoagulants) or medicines to break apart blood clots (thrombolytics).  If you  are prescribed blood thinners, it is important to take the medicine every single day at the same time each day.  If you have signs of PE or DVT, call your local emergency services (911 in the [place] This information is not intended to replace advice given to you by your health care provider. Make sure you discuss any questions you have with your health care provider. Document Released: 06/09/2000 Document Revised: 07/15/2016 Document Reviewed: 07/15/2016 Elsevier Interactive Patient Education  2018 [employer]

## 2017-06-22 NOTE — Progress Notes (Addendum)
Spoke with Harold Collins [phone] Workers Comp concerning pt discharging home with Xarelto. Workers Comp will cover the cost of Xarelto.

## 2017-06-23 LAB — CBC
HEMATOCRIT: 32.7 % — AB (ref 39.0–52.0)
Hemoglobin: 10.9 g/dL — ABNORMAL LOW (ref 13.0–17.0)
MCH: 30.5 pg (ref 26.0–34.0)
MCHC: 33.3 g/dL (ref 30.0–36.0)
MCV: 91.6 fL (ref 78.0–100.0)
PLATELETS: 493 10*3/uL — AB (ref 150–400)
RBC: 3.57 MIL/uL — AB (ref 4.22–5.81)
RDW: 12.9 % (ref 11.5–15.5)
WBC: 11 10*3/uL — AB (ref 4.0–10.5)

## 2017-06-23 LAB — BASIC METABOLIC PANEL
Anion gap: 8 (ref 5–15)
BUN: 14 mg/dL (ref 6–20)
CHLORIDE: 97 mmol/L — AB (ref 101–111)
CO2: 28 mmol/L (ref 22–32)
CREATININE: 0.82 mg/dL (ref 0.61–1.24)
Calcium: 8.7 mg/dL — ABNORMAL LOW (ref 8.9–10.3)
GFR calc Af Amer: >60 mL/min (ref >60)
GFR calc non Af Amer: >60 mL/min (ref >60)
Glucose, Bld: 108 mg/dL — ABNORMAL HIGH (ref 65–99)
POTASSIUM: 4 mmol/L (ref 3.5–5.1)
Sodium: 133 mmol/L — ABNORMAL LOW (ref 135–145)

## 2017-06-23 MED ORDER — LEVOFLOXACIN 750 MG PO TABS
750.0000 mg | ORAL_TABLET | Freq: Every day | ORAL | Status: DC
  Administered 2017-06-23 – 2017-06-24 (×2): 750 mg via ORAL
  Filled 2017-06-23 (×2): qty 1

## 2017-06-23 MED ORDER — HYDROMORPHONE HCL 1 MG/ML IJ SOLN: 1.0000 mg | INTRAMUSCULAR | Status: DC | PRN

## 2017-06-23 MED ORDER — HYDROMORPHONE HCL 1 MG/ML IJ SOLN
2.0000 mg | INTRAMUSCULAR | Status: DC | PRN
  Administered 2017-06-23: 2 mg via INTRAVENOUS
  Filled 2017-06-23: qty 2

## 2017-06-23 NOTE — Progress Notes (Signed)
PROGRESS NOTE    Harold Freshwaterric Coil  ZOX:[id number] DOB: April 03, 1981 DOA: 06/20/2017 PCP: Claretta FraiseSkariah, Anita, DO     Brief Narrative:  Harold Collins is a 36 y.o. male with medical history significant of recent surgery on left shoulder about a week ago following shoulder dislocation due to electrocution injury at work. Patient now presents to the ED with complaints of SOB and R scapular pain, hemoptysis.  He was sent in from orthopedist at [facility] due to concerns of possible PE. CTA chest revealed right lower lobe pneumonia as well as PE.   Assessment & Plan:   Principal Problem:   Hemoptysis Active Problems:   HCAP (healthcare-associated pneumonia)   Pulmonary embolus and infarction (HCC)   Right lower lobar pneumonia, HCAP -MRSA PCR negative, discontinue vancomycin -Dr. Hanley BenAlekh discussed case with infection control as well as Dr. Craige CottaSood and discontinued TB rule out due to low suspicion -HIV NR, strep pneumo negative  -Deescalate antibiotics to levaquin  -Sputum culture pending, blood culture NGTD   Pulmonary embolism -Venous duplex negative for DVT -Xarelto started   Right sided pleuritic back pain -Sharp pain worse with deep breaths and cough, not reproducible to palpation -Discussed with patient, wife, RN to start using oral pain medication over IV Dilaudid.  IV Dilaudid has made patient very drowsy  Recent left shoulder surgery -Orthopedic surgery evaluated patient. Continue sling immobilization to LUE, may position arm for comfort when sitting in chair, may support with pillow, apply ice PRN. Follow up in clinic 1 month with Dr. Rennis ChrisSupple   DVT prophylaxis: Xarelto Code Status: Full Family Communication: At bedside Disposition Plan: Pending improvement in pain control    Consultants:   Ortho   Procedures:   None   Antimicrobials:  Anti-infectives (From admission, onward)   Start     Dose/Rate Route Frequency Ordered Stop   06/23/17 1500  levofloxacin (LEVAQUIN)  tablet 750 mg     750 mg Oral Daily 06/23/17 1401     06/21/17 1000  vancomycin (VANCOCIN) IVPB 1000 mg/200 mL premix  Status:  Discontinued     1,000 mg 200 mL/hr over 60 Minutes Intravenous Every 12 hours 06/21/17 0222 06/22/17 1329   06/21/17 0600  ceFEPIme (MAXIPIME) 1 g in dextrose 5 % 50 mL IVPB  Status:  Discontinued     1 g 100 mL/hr over 30 Minutes Intravenous Every 8 hours 06/21/17 0221 06/23/17 1401   06/20/17 2230  vancomycin (VANCOCIN) 2,500 mg in sodium chloride 0.9 % 500 mL IVPB     2,500 mg 250 mL/hr over 120 Minutes Intravenous  Once 06/20/17 2218 06/21/17 0305   06/20/17 2200  ceFEPIme (MAXIPIME) 1 g in dextrose 5 % 50 mL IVPB     1 g 100 mL/hr over 30 Minutes Intravenous  Once 06/20/17 2156 06/20/17 2247       Subjective: Continues to have right sided back pain.  Has been sleeping most of the day.  Objective: Vitals:   06/22/17 1500 06/22/17 2119 06/23/17 0533 06/23/17 1304  BP: (!) 150/88 (!) 146/80 136/82 (!) 142/90  Pulse: (!) 101 (!) 101 100 88  Resp: 20 20 20 18   Temp: 97.6 F (36.4 C) 98.6 F (37 C) 98.7 F (37.1 C) 98.2 F (36.8 C)  TempSrc: Oral Oral Oral Oral  SpO2: 92% 98% 98% 94%  Weight:      Height:        Intake/Output Summary (Last 24 hours) at 06/23/2017 1403 Last data filed at 06/23/2017 [id number] Gross per  24 hour  Intake 511.35 ml  Output 450 ml  Net 61.35 ml   Filed Weights   06/20/17 2210  Weight: 109.6 kg (241 lb 11.2 oz)    Examination:  General exam: Appears calm, lethargic  Respiratory system: Clear to auscultation, right lower lobe diminished. Respiratory effort normal Cardiovascular system: S1 & S2 heard, RRR. No JVD, murmurs, rubs, gallops or clicks. No pedal edema. Gastrointestinal system: Abdomen is nondistended, soft and nontender. No organomegaly or masses felt. Normal bowel sounds heard. Central nervous system: Alert and oriented. No focal neurological deficits. Extremities: Symmetric 5 x 5 power. MSK: No TTP  thoracic spine  Skin: No rashes, lesions or ulcers Psychiatry: Judgement and insight appear normal. Mood & affect appropriate.   Data Reviewed: I have personally reviewed following labs and imaging studies  CBC: Recent Labs  Lab 06/20/17 1910 06/21/17 0642 06/22/17 0540 06/23/17 0427  WBC 13.3* 9.1 13.0* 11.0*  HGB 13.4 11.9* 11.2* 10.9*  HCT 39.6 35.6* 33.8* 32.7*  MCV 92.7 92.7 91.8 91.6  PLT 448* 392 437* 493*   Basic Metabolic Panel: Recent Labs  Lab 06/20/17 1910 06/21/17 0642 06/22/17 0540 06/23/17 0427  NA 135 135 133* 133*  K 4.6 4.3 4.4 4.0  CL 100* 101 96* 97*  CO2 26 25 28 28   GLUCOSE 215* 108* 140* 108*  BUN 17 14 13 14   CREATININE 1.41* 1.08 0.92 0.82  CALCIUM 9.5 8.7* 9.0 8.7*  MG  --   --  1.8  --    GFR: Estimated Creatinine Clearance: 158.3 mL/min (by C-G formula based on SCr of 0.82 mg/dL). Liver Function Tests: Recent Labs  Lab 06/21/17 0642  AST 14*  ALT 25  ALKPHOS 53  BILITOT 0.8  PROT 7.5  ALBUMIN 4.0   No results for input(s): LIPASE, AMYLASE in the last 168 hours. No results for input(s): AMMONIA in the last 168 hours. Coagulation Profile: Recent Labs  Lab 06/21/17 0125  INR 1.02   Cardiac Enzymes: Recent Labs  Lab 06/21/17 0642 06/21/17 0841 06/21/17 1508  TROPONINI <0.03 <0.03 <0.03   BNP (last 3 results) No results for input(s): PROBNP in the last 8760 hours. HbA1C: No results for input(s): HGBA1C in the last 72 hours. CBG: No results for input(s): GLUCAP in the last 168 hours. Lipid Profile: No results for input(s): CHOL, HDL, LDLCALC, TRIG, CHOLHDL, LDLDIRECT in the last 72 hours. Thyroid Function Tests: No results for input(s): TSH, T4TOTAL, FREET4, T3FREE, THYROIDAB in the last 72 hours. Anemia Panel: No results for input(s): VITAMINB12, FOLATE, FERRITIN, TIBC, IRON, RETICCTPCT in the last 72 hours. Sepsis Labs: Recent Labs  Lab 06/20/17 2213 06/21/17 0642  PROCALCITON  --  0.11  LATICACIDVEN 1.6  1.3   --     Recent Results (from the past 240 hour(s))  Culture, blood (routine x 2) Call MD if unable to obtain prior to antibiotics being given     Status: None (Preliminary result)   Collection Time: 06/21/17  8:41 AM  Result Value Ref Range Status   Specimen Description BLOOD RIGHT HAND  Final   Special Requests   Final    BOTTLES DRAWN AEROBIC ONLY Blood Culture adequate volume   Culture   Final    NO GROWTH 2 DAYS Performed at [facility], [address] [place], [place] [address]    Report Status PENDING  Incomplete  Culture, blood (routine x 2) Call MD if unable to obtain prior to antibiotics being given  Status: None (Preliminary result)   Collection Time: 06/21/17  8:46 AM  Result Value Ref Range Status   Specimen Description BLOOD LEFT HAND  Final   Special Requests   Final    BOTTLES DRAWN AEROBIC ONLY Blood Culture adequate volume   Culture   Final    NO GROWTH 2 DAYS Performed at [facility], [address], [place], [place] [address]    Report Status PENDING  Incomplete  Culture, sputum-assessment     Status: None   Collection Time: 06/21/17  3:00 PM  Result Value Ref Range Status   Specimen Description EXPECTORATED SPUTUM  Final   Special Requests NONE  Final   Sputum evaluation THIS SPECIMEN IS ACCEPTABLE FOR SPUTUM CULTURE  Final   Report Status 06/21/2017 FINAL  Final  Culture, respiratory (NON-Expectorated)     Status: None (Preliminary result)   Collection Time: 06/21/17  3:00 PM  Result Value Ref Range Status   Specimen Description EXPECTORATED SPUTUM  Final   Special Requests NONE Reflexed from [id number]  Final   Gram Stain   Final    ABUNDANT WBC PRESENT,BOTH PMN AND MONONUCLEAR RARE SQUAMOUS EPITHELIAL CELLS PRESENT FEW GRAM NEGATIVE COCCI IN PAIRS RARE GRAM NEGATIVE RODS RARE GRAM POSITIVE COCCI IN CLUSTERS RARE GRAM POSITIVE RODS    Culture   Final    CULTURE REINCUBATED FOR BETTER GROWTH Performed at [facility], [address], [name], [place] [address]    Report Status PENDING  Incomplete  MRSA PCR Screening     Status: None   Collection Time: 06/22/17  8:14 AM  Result Value Ref Range Status   MRSA by PCR NEGATIVE NEGATIVE Final    Comment:        The GeneXpert MRSA Assay (FDA approved for NASAL specimens only), is one component of a comprehensive MRSA colonization surveillance program. It is not intended to diagnose MRSA infection nor to guide or monitor treatment for MRSA infections.        Radiology Studies: No results found.    Scheduled Meds: . cholecalciferol  2,000 Units Oral Daily  . fenofibrate  160 mg Oral Daily  . levofloxacin  750 mg Oral Q1500  . multivitamin with minerals  1 tablet Oral Daily  . pantoprazole  80 mg Oral Daily  . rivaroxaban  15 mg Oral BID WC  . venlafaxine XR  225 mg Oral Daily   Continuous Infusions: . methocarbamol (ROBAXIN)  IV Stopped (06/23/17 0610)     LOS: 2 days    Time spent: 30 minutes   Noralee Stain, DO [facility] [email or web address] Password [email or web address] 06/23/2017, 2:03 PM

## 2017-06-24 LAB — QUANTIFERON-TB GOLD PLUS (RQFGPL)
QUANTIFERON TB2 AG VALUE: 0.02 [IU]/mL
QuantiFERON Mitogen Value: >10 IU/mL
QuantiFERON Nil Value: 0.03 IU/mL
QuantiFERON TB1 Ag Value: 0.03 IU/mL

## 2017-06-24 LAB — CULTURE, RESPIRATORY

## 2017-06-24 LAB — QUANTIFERON-TB GOLD PLUS: QuantiFERON-TB Gold Plus: NEGATIVE

## 2017-06-24 LAB — CULTURE, RESPIRATORY W GRAM STAIN: Culture: NORMAL

## 2017-06-24 MED ORDER — OXYCODONE-ACETAMINOPHEN 5-325 MG PO TABS: 1.0000 | ORAL_TABLET | ORAL | 0 refills | Status: DC | PRN

## 2017-06-24 MED ORDER — RIVAROXABAN (XARELTO) VTE STARTER PACK (15 & 20 MG): ORAL_TABLET | ORAL | 0 refills | Status: DC

## 2017-06-24 MED ORDER — LEVOFLOXACIN 750 MG PO TABS: 750.0000 mg | ORAL_TABLET | Freq: Every day | ORAL | 0 refills | Status: AC

## 2017-06-24 NOTE — Discharge Summary (Signed)
Physician Discharge Summary  Harold Collins ZOX:[id number] DOB: 1981-06-05 DOA: 06/20/2017  PCP: Claretta Fraise, DO  Admit date: 06/20/2017 Discharge date: 06/24/2017  Admitted From: Home Disposition:  Home  Recommendations for Outpatient Follow-up:  1. Follow up with PCP in 1 week 2. Follow up with Dr. Rennis Chris in 1 month  3. Please follow up on the following pending results: final sputum culture, blood culture result   Home Health: None  Equipment/Devices: O2   Discharge Condition: Stable CODE STATUS: Full  Diet recommendation: Heart healthy   Brief/Interim Summary: Harold Collins a 36 y.o.malewith medical history significant ofrecent surgery on left shoulder about a week ago following shoulder dislocation due to electrocution injury at work. Patient now presents to the ED with complaints of SOB and R scapular pain, hemoptysis. He was sent in from orthopedist at [facility] due to concerns of possible PE. CTA chest revealed right lower lobe pneumonia as well as PE.  He was started on IV antibiotics for healthcare acquired pneumonia as well as heparin drip for pulmonary embolism.  These are likely related to recent hospitalization for left shoulder injury sustained at work.  His antibiotics were eventually escalated to oral Levaquin.  After discussion with family, patient was started on Xarelto for anticoagulation.  As patient did have a provoked first-time thrombosis, he will need at least 109-month anticoagulation.  Hospitalization was complicated by pleuritic chest pain.  His pain continued to improve and was doing well on oral pain medication.  Patient also desaturated on room air and required home oxygen on discharge.  Discharge Diagnoses:  Principal Problem:   Hemoptysis Active Problems:   HCAP (healthcare-associated pneumonia)   Pulmonary embolus and infarction (HCC)   Right lower lobar pneumonia, HCAP -MRSA PCR negative, discontinue vancomycin -Dr. Hanley Ben discussed case  with infection control as well as Dr. Craige Cotta and discontinued TB rule out due to low suspicion -HIV NR, strep pneumo negative  -Deescalate antibiotics to levaquin  -Sputum culture pending, blood culture NGTD   Pulmonary embolism -Venous duplex negative for DVT -Xarelto started   Right sided pleuritic back pain -Sharp pain worse with deep breaths and cough, not reproducible to palpation -Improved today, has not required IV pain medication in 24 hours   Recent left shoulder surgery -Orthopedic surgery evaluated patient. Continue sling immobilization to LUE, may position arm for comfort when sitting in chair, may support with pillow, apply ice PRN. Follow up in clinic 1 month with Dr. Rennis Chris     Discharge Instructions  Discharge Instructions    Call MD for:   Complete by:  As directed    Worsening shortness of breath   Call MD for:  difficulty breathing, headache or visual disturbances   Complete by:  As directed    Call MD for:  extreme fatigue   Complete by:  As directed    Call MD for:  hives   Complete by:  As directed    Call MD for:  persistant dizziness or light-headedness   Complete by:  As directed    Call MD for:  persistant nausea and vomiting   Complete by:  As directed    Call MD for:  severe uncontrolled pain   Complete by:  As directed    Call MD for:  temperature >100.4   Complete by:  As directed    Diet - low sodium heart healthy   Complete by:  As directed    Discharge instructions   Complete by:  As directed  You were cared for by a hospitalist during your hospital stay. If you have any questions about your discharge medications or the care you received while you were in the hospital after you are discharged, you can call the unit and asked to speak with the hospitalist on call if the hospitalist that took care of you is not available. Once you are discharged, your primary care physician will handle any further medical issues. Please note that NO REFILLS  for any discharge medications will be authorized once you are discharged, as it is imperative that you return to your primary care physician (or establish a relationship with a primary care physician if you do not have one) for your aftercare needs so that they can reassess your need for medications and monitor your lab values.   Increase activity slowly   Complete by:  As directed      Allergies as of 06/24/2017   No Known Allergies     Medication List    TAKE these medications   diazepam 5 MG tablet Commonly known as:  VALIUM Take 5 mg by mouth every 6 (six) hours as needed for muscle spasms.   fenofibrate 160 MG tablet Take 160 mg by mouth daily.   levofloxacin 750 MG tablet Commonly known as:  LEVAQUIN Take 1 tablet (750 mg total) by mouth daily at 3 pm for 2 days.   MULTIVITAMIN ADULTS Tabs Take 1 tablet by mouth daily.   naproxen 500 MG tablet Commonly known as:  NAPROSYN Take 500 mg by mouth 2 (two) times daily with a meal.   omeprazole 40 MG capsule Commonly known as:  PRILOSEC Take 40 mg by mouth daily.   ondansetron 4 MG tablet Commonly known as:  ZOFRAN Take 4 mg by mouth every 8 (eight) hours as needed for nausea or vomiting.   oxyCODONE-acetaminophen 5-325 MG tablet Commonly known as:  PERCOCET/ROXICET Take 1-2 tablets by mouth every 4 (four) hours as needed for moderate pain or severe pain. What changed:  how much to take   Rivaroxaban 15 & 20 MG Tbpk Start with one 15mg  tablet by mouth twice a day with food. On Day 22, switch to one 20mg  tablet once a day with food.   Venlafaxine HCl 225 MG Tb24 Take 1 tablet by mouth daily.   Vitamin D 2000 units tablet Take 2,000 Units by mouth daily.            Durable Medical Equipment  (From admission, onward)        Start     Ordered   06/24/17 1230  For home use only DME oxygen  Once    Question Answer Comment  Mode or (Route) Nasal cannula   Liters per Minute 2   Frequency Continuous  (stationary and portable oxygen unit needed)   Oxygen delivery system Gas      06/24/17 1229     Follow-up Information    Claretta Fraise, DO. Schedule an appointment as soon as possible for a visit in 1 week(s).   Specialty:  Internal Medicine Contact information: [address] [facility] [place] [address] [phone]        Francena Hanly, MD. Schedule an appointment as soon as possible for a visit in 1 month(s).   Specialty:  Orthopedic Surgery Contact information: [address] [place] [address] [phone]          No Known Allergies  Consultations:  Orthopedic surgery  Procedures/Studies: Dg Chest 2 View  Result Date: 06/20/2017 CLINICAL DATA:  Shortness of breath and back pain after shoulder surgery 1 week ago. EXAM: CHEST  2 VIEW COMPARISON:  None. FINDINGS: Limited lateral radiograph as patient is unable to elevate the arm. Cardiomediastinal silhouette is unremarkable for this low inspiratory examination with crowded vasculature markings. Patchy bibasilar airspace opacities. Trachea projects midline and there is no pneumothorax. Included soft tissue planes and osseous structures are non-suspicious. IMPRESSION: Patchy bibasilar atelectasis, less likely pneumonia in this low inspiratory examination. Electronically Signed   By: Awilda Metro M.D.   On: 06/20/2017 19:44   Ct Angio Chest Pe W/cm &/or Wo Cm  Addendum Date: 06/21/2017   ADDENDUM REPORT: 06/21/2017 00:52 ADDENDUM: Repeat CTA images were obtained due to motion. Additional 80 mL Isovue 370 intravenous was administered. Nonocclusive filling defects are present within the right upper lobe lobar, and segmental branches, consistent with acute pulmonary emboli. Evaluation of the right lower lobe branches are still somewhat limited by motion. RV LV ratio is normal. Critical Value/emergent results were called by telephone at the time of  interpretation on 06/21/2017 at 12:51 am to Dr. Dahlia Client who assumed care of the patient for [facility] , who verbally acknowledged these results. Electronically Signed   By: Jasmine Pang M.D.   On: 06/21/2017 00:52   Addendum Date: 06/20/2017   ADDENDUM REPORT: 06/20/2017 21:43 ADDENDUM: Findings reviewed with the ordering physician, physician assistant Dayton Scrape, who tells me that the clinical symptoms are highly worrisome for pulmonary embolism. Again, characterization of the pulmonary artery branches is significantly limited by extensive breathing motion artifact. However, the segmental pulmonary artery branches to the right lower lobe are asymmetrically prominent in size which could be a secondary sign of obstructing pulmonary embolism. Also, the dense consolidation at the right lung base could be sequela of associated pulmonary infarction. Per this discussion, ordering physician will hydrate the patient, confirm adequate renal function, provide additional pain medications and then repeat the CT angiogram for hopefully better characterization of the lower lobe pulmonary arteries. Electronically Signed   By: Bary Richard M.D.   On: 06/20/2017 21:43   Result Date: 06/21/2017 CLINICAL DATA:  Shoulder surgery on December 18th, back pain worsening since surgery. Shortness of breath yesterday. Rule out pulmonary embolism. EXAM: CT ANGIOGRAPHY CHEST WITH CONTRAST TECHNIQUE: Multidetector CT imaging of the chest was performed using the standard protocol during bolus administration of intravenous contrast. Multiplanar CT image reconstructions and MIPs were obtained to evaluate the vascular anatomy. CONTRAST:  ISOVUE-370 IOPAMIDOL (ISOVUE-370) INJECTION 76% COMPARISON:  None. FINDINGS: Cardiovascular: Study is significantly limited by patient breathing motion artifact. There is no large obstructing pulmonary embolism identified within the main or central lobar pulmonary arteries. I cannot confidently  characterize the more peripheral segmental and subsegmental pulmonary arteries due to the motion artifact. Heart size is normal. No pericardial effusion. No aortic aneurysm or evidence of aortic dissection. Mediastinum/Nodes: No mass or enlarged lymph nodes seen within the mediastinum or perihilar regions. Esophagus is unremarkable. Trachea and central bronchi are unremarkable. Lungs/Pleura: Dense consolidation within the right lower lobe, pneumonia versus aspiration. Smaller consolidation at the left lung base. Trace left pleural effusion. Upper lungs are clear. Upper Abdomen: No acute findings. Musculoskeletal: Mild degenerative spurring within the lower thoracic spine. No acute or suspicious osseous finding. Review of the MIP images confirms the above findings. IMPRESSION: 1. Dense consolidation within the right lower lobe, consistent with pneumonia or aspiration. 2. Smaller consolidation at the left lung  base, compatible with atelectasis or aspiration. Associated trace left pleural effusion. 3. No central obstructing pulmonary embolism. The majority of the pulmonary artery branches, however, cannot be confidently characterized due to patient breathing motion artifact. Electronically Signed: By: Bary RichardStan  Maynard M.D. On: 06/20/2017 20:57       Discharge Exam: Vitals:   06/24/17 0418 06/24/17 1050  BP: 126/70   Pulse: 93   Resp: 19   Temp: 98.2 F (36.8 C)   SpO2: 100% (!) 89%   Vitals:   06/23/17 1304 06/23/17 2001 06/24/17 0418 06/24/17 1050  BP: (!) 142/90 124/63 126/70   Pulse: 88 (!) 103 93   Resp: 18 18 19    Temp: 98.2 F (36.8 C) 98.3 F (36.8 C) 98.2 F (36.8 C)   TempSrc: Oral Oral Oral   SpO2: 94% 95% 100% (!) 89%  Weight:      Height:        General: Pt is alert, awake, not in acute distress Cardiovascular: RRR, S1/S2 +, no rubs, no gallops Respiratory:RLL crackles, no wheezing, no rhonchi Abdominal: Soft, NT, ND, bowel sounds + Extremities: no edema, no  cyanosis    The results of significant diagnostics from this hospitalization (including imaging, microbiology, ancillary and laboratory) are listed below for reference.     Microbiology: Recent Results (from the past 240 hour(s))  Culture, blood (routine x 2) Call MD if unable to obtain prior to antibiotics being given     Status: None (Preliminary result)   Collection Time: 06/21/17  8:41 AM  Result Value Ref Range Status   Specimen Description BLOOD RIGHT HAND  Final   Special Requests   Final    BOTTLES DRAWN AEROBIC ONLY Blood Culture adequate volume   Culture   Final    NO GROWTH 2 DAYS Performed at [facility], [address] [place], [place] [address]    Report Status PENDING  Incomplete  Culture, blood (routine x 2) Call MD if unable to obtain prior to antibiotics being given     Status: None (Preliminary result)   Collection Time: 06/21/17  8:46 AM  Result Value Ref Range Status   Specimen Description BLOOD LEFT HAND  Final   Special Requests   Final    BOTTLES DRAWN AEROBIC ONLY Blood Culture adequate volume   Culture   Final    NO GROWTH 2 DAYS Performed at [facility], [address] [place], [place] [address]    Report Status PENDING  Incomplete  Culture, sputum-assessment     Status: None   Collection Time: 06/21/17  3:00 PM  Result Value Ref Range Status   Specimen Description EXPECTORATED SPUTUM  Final   Special Requests NONE  Final   Sputum evaluation THIS SPECIMEN IS ACCEPTABLE FOR SPUTUM CULTURE  Final   Report Status 06/21/2017 FINAL  Final  Culture, respiratory (NON-Expectorated)     Status: None   Collection Time: 06/21/17  3:00 PM  Result Value Ref Range Status   Specimen Description EXPECTORATED SPUTUM  Final   Special Requests NONE Reflexed from [id number]  Final   Gram Stain   Final    ABUNDANT WBC PRESENT,BOTH PMN AND MONONUCLEAR RARE SQUAMOUS EPITHELIAL CELLS PRESENT FEW GRAM NEGATIVE COCCI IN PAIRS RARE GRAM NEGATIVE RODS RARE  GRAM POSITIVE COCCI IN CLUSTERS RARE GRAM POSITIVE RODS    Culture   Final    Consistent with normal respiratory flora. Performed at [facility], [address] [place], [place]  [address]    Report Status 06/24/2017 FINAL  Final  MRSA PCR Screening     Status: None   Collection Time: 06/22/17  8:14 AM  Result Value Ref Range Status   MRSA by PCR NEGATIVE NEGATIVE Final    Comment:        The GeneXpert MRSA Assay (FDA approved for NASAL specimens only), is one component of a comprehensive MRSA colonization surveillance program. It is not intended to diagnose MRSA infection nor to guide or monitor treatment for MRSA infections.      Labs: BNP (last 3 results) No results for input(s): BNP in the last 8760 hours. Basic Metabolic Panel: Recent Labs  Lab 06/20/17 1910 06/21/17 0642 06/22/17 0540 06/23/17 0427  NA 135 135 133* 133*  K 4.6 4.3 4.4 4.0  CL 100* 101 96* 97*  CO2 26 25 28 28   GLUCOSE 215* 108* 140* 108*  BUN 17 14 13 14   CREATININE 1.41* 1.08 0.92 0.82  CALCIUM 9.5 8.7* 9.0 8.7*  MG  --   --  1.8  --    Liver Function Tests: Recent Labs  Lab 06/21/17 0642  AST 14*  ALT 25  ALKPHOS 53  BILITOT 0.8  PROT 7.5  ALBUMIN 4.0   No results for input(s): LIPASE, AMYLASE in the last 168 hours. No results for input(s): AMMONIA in the last 168 hours. CBC: Recent Labs  Lab 06/20/17 1910 06/21/17 0642 06/22/17 0540 06/23/17 0427  WBC 13.3* 9.1 13.0* 11.0*  HGB 13.4 11.9* 11.2* 10.9*  HCT 39.6 35.6* 33.8* 32.7*  MCV 92.7 92.7 91.8 91.6  PLT 448* 392 437* 493*   Cardiac Enzymes: Recent Labs  Lab 06/21/17 0642 06/21/17 0841 06/21/17 1508  TROPONINI <0.03 <0.03 <0.03   BNP: Invalid input(s): POCBNP CBG: No results for input(s): GLUCAP in the last 168 hours. D-Dimer No results for input(s): DDIMER in the last 72 hours. Hgb A1c No results for input(s): HGBA1C in the last 72 hours. Lipid Profile No results for input(s): CHOL, HDL,  LDLCALC, TRIG, CHOLHDL, LDLDIRECT in the last 72 hours. Thyroid function studies No results for input(s): TSH, T4TOTAL, T3FREE, THYROIDAB in the last 72 hours.  Invalid input(s): FREET3 Anemia work up No results for input(s): VITAMINB12, FOLATE, FERRITIN, TIBC, IRON, RETICCTPCT in the last 72 hours. Urinalysis No results found for: COLORURINE, APPEARANCEUR, LABSPEC, PHURINE, GLUCOSEU, HGBUR, BILIRUBINUR, KETONESUR, PROTEINUR, UROBILINOGEN, NITRITE, LEUKOCYTESUR Sepsis Labs Invalid input(s): PROCALCITONIN,  WBC,  LACTICIDVEN Microbiology Recent Results (from the past 240 hour(s))  Culture, blood (routine x 2) Call MD if unable to obtain prior to antibiotics being given     Status: None (Preliminary result)   Collection Time: 06/21/17  8:41 AM  Result Value Ref Range Status   Specimen Description BLOOD RIGHT HAND  Final   Special Requests   Final    BOTTLES DRAWN AEROBIC ONLY Blood Culture adequate volume   Culture   Final    NO GROWTH 2 DAYS Performed at [facility], [address] [place], [place] [address]    Report Status PENDING  Incomplete  Culture, blood (routine x 2) Call MD if unable to obtain prior to antibiotics being given     Status: None (Preliminary result)   Collection Time: 06/21/17  8:46 AM  Result Value Ref Range Status   Specimen Description BLOOD LEFT HAND  Final   Special Requests   Final    BOTTLES DRAWN AEROBIC ONLY Blood Culture adequate volume   Culture  Final    NO GROWTH 2 DAYS Performed at [facility], [address] [place], [place] [address]    Report Status PENDING  Incomplete  Culture, sputum-assessment     Status: None   Collection Time: 06/21/17  3:00 PM  Result Value Ref Range Status   Specimen Description EXPECTORATED SPUTUM  Final   Special Requests NONE  Final   Sputum evaluation THIS SPECIMEN IS ACCEPTABLE FOR SPUTUM CULTURE  Final   Report Status 06/21/2017 FINAL  Final  Culture, respiratory (NON-Expectorated)      Status: None   Collection Time: 06/21/17  3:00 PM  Result Value Ref Range Status   Specimen Description EXPECTORATED SPUTUM  Final   Special Requests NONE Reflexed from [id number]  Final   Gram Stain   Final    ABUNDANT WBC PRESENT,BOTH PMN AND MONONUCLEAR RARE SQUAMOUS EPITHELIAL CELLS PRESENT FEW GRAM NEGATIVE COCCI IN PAIRS RARE GRAM NEGATIVE RODS RARE GRAM POSITIVE COCCI IN CLUSTERS RARE GRAM POSITIVE RODS    Culture   Final    Consistent with normal respiratory flora. Performed at [facility], [address] [place], [place] [address]    Report Status 06/24/2017 FINAL  Final  MRSA PCR Screening     Status: None   Collection Time: 06/22/17  8:14 AM  Result Value Ref Range Status   MRSA by PCR NEGATIVE NEGATIVE Final    Comment:        The GeneXpert MRSA Assay (FDA approved for NASAL specimens only), is one component of a comprehensive MRSA colonization surveillance program. It is not intended to diagnose MRSA infection nor to guide or monitor treatment for MRSA infections.      Time coordinating discharge: 40 minutes  SIGNED:  Noralee StainJennifer Jalila Goodnough, DO [facility] Pager [phone]  If 7PM-7AM, please contact night-coverage [email or web address] Password [email or web address] 06/24/2017, 12:40 PM

## 2017-06-24 NOTE — Evaluation (Signed)
Physical Therapy Evaluation Patient Details Name: Harold Collins MRN: [id number] DOB: January 21, 1981 Today's Date: 06/24/2017   History of Present Illness  Harold Collins is a 36 y.o. male with medical history significant of recent surgery on left shoulder about a week ago following shoulder dislocation due to electrocution injury at work. Patient now presents to the ED with complaints of SOB and R scapular pain, hemoptysis.  He was sent in from [facility]opedist at [facility] due to concerns of possible PE. CTA chest revealed right lower lobe pneumonia as well as PE.   Clinical Impression  Pt admitted with above diagnosis. Pt currently with functional limitations due to the deficits listed below (see PT Problem List).  Pt will benefit from skilled PT to increase their independence and safety with mobility to allow discharge to the venue listed below.  Pt de-sat on RA to 85% with gait.  Education provided for breathing techniques and incentive spirometer.        Follow Up Recommendations No PT follow up    Equipment Recommendations  None recommended by PT    Recommendations for Other Services       Precautions / Restrictions Precautions Precautions: Other (comment) Precaution Comments: sling due to recent L shoulder surgery Required Braces or Orthoses: Sling      Mobility  Bed Mobility               General bed mobility comments: up in recliner upon arrival  Transfers Overall transfer level: Modified independent                  Ambulation/Gait Ambulation/Gait assistance: Modified independent (Device/Increase time) Ambulation Distance (Feet): 500 Feet Assistive device: None Gait Pattern/deviations: Step-through pattern;Trunk flexed     General Gait Details: slow cadence with o2 dropping to 85% on room air.  Worked on deep breathing and it would increase to upper 80's and even 90, but once he started ambulating again it would drop.  On 2 L/min, o2 90-92%  Stairs             Wheelchair Mobility    Modified Rankin (Stroke Patients Only)       Balance Overall balance assessment: Modified Independent                                           Pertinent Vitals/Pain Pain Assessment: 0-10 Pain Score: 2  Pain Location: middle lower back onb the R Pain Descriptors / Indicators: Aching;Sharp Pain Intervention(s): Limited activity within patient's tolerance;Monitored during session    Home Living Family/patient expects to be discharged to:: Private residence Living Arrangements: Spouse/significant other;Children Available Help at Discharge: Family;Available PRN/intermittently Type of Home: House Home Access: Stairs to enter Entrance Stairs-Rails: Can reach both Entrance Stairs-Number of Steps: 2 Home Layout: Two level;Bed/bath upstairs Home Equipment: None      Prior Function Level of Independence: Independent               Hand Dominance        Extremity/Trunk Assessment   Upper Extremity Assessment Upper Extremity Assessment: Defer to OT evaluation;LUE deficits/detail LUE Deficits / Details: sling L UE    Lower Extremity Assessment Lower Extremity Assessment: Overall WFL for tasks assessed       Communication      Cognition Arousal/Alertness: Awake/alert Behavior During Therapy: WFL for tasks assessed/performed Overall Cognitive Status: Within Functional Limits for tasks  assessed                                        General Comments General comments (skin integrity, edema, etc.): issued incentive spirometer and encouraged to use    Exercises     Assessment/Plan    PT Assessment Patient needs continued PT services  PT Problem List Cardiopulmonary status limiting activity;Decreased activity tolerance       PT Treatment Interventions Gait training;Stair training;Functional mobility training    PT Goals (Current goals can be found in the Care Plan section)  Acute Rehab  PT Goals Patient Stated Goal: home PT Goal Formulation: With patient/family Time For Goal Achievement: 07/08/17    Frequency Min 2X/week   Barriers to discharge        Co-evaluation               AM-PAC PT "6 Clicks" Daily Activity  Outcome Measure Difficulty turning over in bed (including adjusting bedclothes, sheets and blankets)?: A Little Difficulty moving from lying on back to sitting on the side of the bed? : A Little Difficulty sitting down on and standing up from a chair with arms (e.g., wheelchair, bedside commode, etc,.)?: A Little Help needed moving to and from a bed to chair (including a wheelchair)?: None Help needed walking in hospital room?: None Help needed climbing 3-5 steps with a railing? : A Little 6 Click Score: 20    End of Session   Activity Tolerance: Treatment limited secondary to medical complications (Comment)(de-sat with gait) Patient left: in chair;with family/visitor present Nurse Communication: Mobility status PT Visit Diagnosis: Other abnormalities of gait and mobility (R26.89)    Time: [id number] PT Time Calculation (min) (ACUTE ONLY): 35 min   Charges:   PT Evaluation $PT Eval Low Complexity: 1 Low PT Treatments $Gait Training: 8-22 mins   PT G Codes:        Taquila Leys L. Katrinka BlazingSmith, [place] Pager [phone] 06/24/2017   Enzo MontgomeryKaren L Teaira Croft 06/24/2017, 11:37 AM

## 2017-06-24 NOTE — Progress Notes (Signed)
PT note: SATURATION QUALIFICATIONS: (This note is used to comply with regulatory documentation for home oxygen)  Patient Saturations on Room Air at Rest = 89%  Patient Saturations on Room Air while Ambulating = 85%  Patient Saturations on 2 Liters of oxygen while Ambulating = 90%  Please briefly explain why patient needs home oxygen: de-sats on room air.

## 2017-06-24 NOTE — Progress Notes (Signed)
Patient remains a&ox4, ambulatory without assist. Sling and incsion cdi. Discharge instructions reviewed, questions concerns denied. Prescriptions sent over to pharmacy per avs.

## 2017-06-26 LAB — CULTURE, BLOOD (ROUTINE X 2)
CULTURE: NO GROWTH
CULTURE: NO GROWTH
SPECIAL REQUESTS: ADEQUATE
Special Requests: ADEQUATE

## 2017-07-05 DIAGNOSIS — T754XXA Electrocution, initial encounter: Secondary | ICD-10-CM | POA: Diagnosis not present

## 2017-07-05 DIAGNOSIS — D649 Anemia, unspecified: Secondary | ICD-10-CM | POA: Diagnosis not present

## 2017-07-05 DIAGNOSIS — J17 Pneumonia in diseases classified elsewhere: Secondary | ICD-10-CM | POA: Diagnosis not present

## 2017-07-05 DIAGNOSIS — F322 Major depressive disorder, single episode, severe without psychotic features: Secondary | ICD-10-CM | POA: Diagnosis not present

## 2017-07-05 DIAGNOSIS — I2699 Other pulmonary embolism without acute cor pulmonale: Secondary | ICD-10-CM | POA: Diagnosis not present

## 2017-07-25 ENCOUNTER — Ambulatory Visit: Payer: Self-pay | Admitting: Family Medicine

## 2017-07-25 ENCOUNTER — Ambulatory Visit (INDEPENDENT_AMBULATORY_CARE_PROVIDER_SITE_OTHER): Payer: BLUE CROSS/BLUE SHIELD | Admitting: Family Medicine

## 2017-07-25 ENCOUNTER — Encounter: Payer: Self-pay | Admitting: Family Medicine

## 2017-07-25 VITALS — BP 119/81 | HR 93 | Ht 71.0 in | Wt 229.3 lb

## 2017-07-25 DIAGNOSIS — F332 Major depressive disorder, recurrent severe without psychotic features: Secondary | ICD-10-CM | POA: Diagnosis not present

## 2017-07-25 DIAGNOSIS — Z8349 Family history of other endocrine, nutritional and metabolic diseases: Secondary | ICD-10-CM | POA: Diagnosis not present

## 2017-07-25 DIAGNOSIS — E559 Vitamin D deficiency, unspecified: Secondary | ICD-10-CM

## 2017-07-25 DIAGNOSIS — F4312 Post-traumatic stress disorder, chronic: Secondary | ICD-10-CM

## 2017-07-25 DIAGNOSIS — Z833 Family history of diabetes mellitus: Secondary | ICD-10-CM

## 2017-07-25 DIAGNOSIS — Z818 Family history of other mental and behavioral disorders: Secondary | ICD-10-CM | POA: Diagnosis not present

## 2017-07-25 DIAGNOSIS — K2270 Barrett's esophagus without dysplasia: Secondary | ICD-10-CM | POA: Diagnosis not present

## 2017-07-25 DIAGNOSIS — Z862 Personal history of diseases of the blood and blood-forming organs and certain disorders involving the immune mechanism: Secondary | ICD-10-CM

## 2017-07-25 DIAGNOSIS — S52539A Colles' fracture of unspecified radius, initial encounter for closed fracture: Secondary | ICD-10-CM | POA: Insufficient documentation

## 2017-07-25 DIAGNOSIS — I2699 Other pulmonary embolism without acute cor pulmonale: Secondary | ICD-10-CM

## 2017-07-25 DIAGNOSIS — Z8261 Family history of arthritis: Secondary | ICD-10-CM

## 2017-07-25 DIAGNOSIS — W868XXA Exposure to other electric current, initial encounter: Secondary | ICD-10-CM

## 2017-07-25 DIAGNOSIS — Z800 Family history of malignant neoplasm of digestive organs: Secondary | ICD-10-CM

## 2017-07-25 DIAGNOSIS — E781 Pure hyperglyceridemia: Secondary | ICD-10-CM

## 2017-07-25 DIAGNOSIS — S52509A Unspecified fracture of the lower end of unspecified radius, initial encounter for closed fracture: Secondary | ICD-10-CM | POA: Insufficient documentation

## 2017-07-25 DIAGNOSIS — R7989 Other specified abnormal findings of blood chemistry: Secondary | ICD-10-CM | POA: Insufficient documentation

## 2017-07-25 DIAGNOSIS — F411 Generalized anxiety disorder: Secondary | ICD-10-CM | POA: Diagnosis not present

## 2017-07-25 DIAGNOSIS — T300 Burn of unspecified body region, unspecified degree: Secondary | ICD-10-CM

## 2017-07-25 NOTE — Patient Instructions (Signed)
When I see you in 2-3 weeks for follow-up to discuss all of your labs, I want to hear who you are seeing as a counselor.   --> Harold Collins- counselor or whomever.     -Also we need to transition your brain into thinking more positively.  These tasks below are some things I want you to do every day 1)  write 3 new things that you are grateful for every day for 21 days  2)  exercise daily- walk for 15 minutes twice a day every day 3)  you are going to journal every day about one positive experience that you had 4)  meditate every day.  You can go on YouTube and look for 15-minute relaxation meditation or what ever.  But we need to make sure that you are in the moment and relaxing and deep breathing every day 5)  Write 1 positive email every day to praise someone in your life       Behavioral Health/ Counseling Referrals    Dr. Marvene Staff, PHD Dr. Marvene Staff, PHD is a counselor in [place], [place].  [address] [place], [place] [address] Contact Information [phone]   Francee Nodal, [place]  91 [phone] [email or web address] ( she does [place] and faith-based coaching and counseling )   [employer]- ( faith-based counseling ) Address: [address] [place], [place] [address] [phone] [address] for appointments [phone] Fax Hours: Monday - Thursday 8:00am-6:00pm Closed for lunch 12-1Thursday only Friday: Closed all day   [facility] Hurley Cisco, LCSW, ACSW, M.ED.  -Hurley Cisco is a licensed clinical social worker in practice over 35 years and with Dr. Milagros Evener for the last 10 years.  -She sees adults, adolescents, children & families and couples. -Services are provided for mood and anxiety disorders, marital issues, family or parent/child problems, parenting, co-dependency, gender issues, trauma, grief, and stages of life issues. She also provides critical  incident stress debriefing.  -Britta Mccreedy accepts many employee assistance programs (EAP), [employer], Chiropractor.  PHONE  [phone]                FAX [phone]   Bethann [email or web address] [employer]- gen counseling;  PHD   Corine Shelter, MSW [address] [place] [phone]   [facility] Caralyn Guile, PhD [address], [place] [phone]   [facility] Psychological- children [address] [place], [place] [phone]   Heloise Beecham Professional Counselor Counseling and [employer] [phone]   [facility] abuse [facility] Manager [address], [place] [phone]   [facility] [address] [place] [phone] Eliott Nine, PhD [place] Scrape, PhD Hollace Kinnier, LCSW Andrena Mews, PhD-child, adolescent and adults   [facility] [address], [place] [phone] Daun Peacock, MS-child, adolescent and adults Madelaine Etienne, PhD-adolescent and adults   [facility]-grief, terminal illness [address], [place] [phone]   [facility] [address], [place] [phone] [facility] [address], [place] [phone]   [facility] [address] [place] [phone]   [facility] [address], [address] [place] [phone]   [facility] [address], Pura Spice [phone]   [facility] [address], [place] [phone]   [facility] [address],  [place] [phone]

## 2017-07-25 NOTE — Progress Notes (Signed)
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   MRSA PCR Screening     Status: None   Collection Time: 06/22/17  8:14 AM  Result Value Ref Range   MRSA by PCR NEGATIVE NEGATIVE    Comment:        The GeneXpert MRSA Assay (FDA approved for NASAL specimens only), is one component of a comprehensive MRSA colonization surveillance program. It is not intended to diagnose MRSA infection nor to guide or monitor treatment for MRSA infections.   Heparin level (unfractionated)     Status: None   Collection Time: 06/22/17  1:01 PM  Result Value Ref Range   Heparin Unfractionated  0.50 0.30 - 0.70 IU/mL    Comment:        IF HEPARIN RESULTS ARE BELOW EXPECTED VALUES, AND PATIENT DOSAGE HAS BEEN CONFIRMED, SUGGEST  FOLLOW UP TESTING OF ANTITHROMBIN III LEVELS.   Basic metabolic panel     Status: Abnormal   Collection Time: 06/23/17  4:27 AM  Result Value Ref Range   Sodium 133 (L) 135 - 145 mmol/L   Potassium 4.0 3.5 - 5.1 mmol/L   Chloride 97 (L) 101 - 111 mmol/L   CO2 28 22 - 32 mmol/L   Glucose, Bld 108 (H) 65 - 99 mg/dL   BUN 14 6 - 20 mg/dL   Creatinine, Ser 0.82 0.61 - 1.24 mg/dL   Calcium 8.7 (L) 8.9 - 10.3 mg/dL   GFR calc non Af Amer >60 >60 mL/min   GFR calc Af Amer >60 >60 mL/min    Comment: (NOTE) The eGFR has been calculated using the CKD EPI equation. This calculation has not been validated in all clinical situations. eGFR's persistently <60 mL/min signify possible Chronic Kidney Disease.    Anion gap 8 5 - 15  CBC     Status: Abnormal   Collection Time: 06/23/17  4:27 AM  Result Value Ref Range   WBC 11.0 (H) 4.0 - 10.5 K/uL   RBC 3.57 (L) 4.22 - 5.81 MIL/uL   Hemoglobin 10.9 (L) 13.0 - 17.0 g/dL   HCT 32.7 (L) 39.0 - 52.0 %   MCV 91.6 78.0 - 100.0 fL   MCH 30.5 26.0 - 34.0 pg   MCHC 33.3 30.0 - 36.0 g/dL   RDW 12.9 11.5 - 15.5 %   Platelets 493 (H) 150 - 400 K/uL

## 2017-07-26 LAB — CBC WITH DIFFERENTIAL/PLATELET
Basophils Absolute: 0 10*3/uL (ref 0.0–0.2)
Basos: 0 %
EOS (ABSOLUTE): 0.5 10*3/uL — AB (ref 0.0–0.4)
Eos: 8 %
Hematocrit: 40.5 % (ref 37.5–51.0)
Hemoglobin: 13.9 g/dL (ref 13.0–17.7)
Immature Grans (Abs): 0 10*3/uL (ref 0.0–0.1)
Immature Granulocytes: 0 %
LYMPHS ABS: 1.5 10*3/uL (ref 0.7–3.1)
LYMPHS: 27 %
MCH: 29.8 pg (ref 26.6–33.0)
MCHC: 34.3 g/dL (ref 31.5–35.7)
MCV: 87 fL (ref 79–97)
MONOS ABS: 0.6 10*3/uL (ref 0.1–0.9)
Monocytes: 11 %
NEUTROS ABS: 2.9 10*3/uL (ref 1.4–7.0)
Neutrophils: 54 %
PLATELETS: 412 10*3/uL — AB (ref 150–379)
RBC: 4.66 x10E6/uL (ref 4.14–5.80)
RDW: 14.3 % (ref 12.3–15.4)
WBC: 5.5 10*3/uL (ref 3.4–10.8)

## 2017-07-26 LAB — COMPREHENSIVE METABOLIC PANEL
ALK PHOS: 65 IU/L (ref 39–117)
ALT: 36 IU/L (ref 0–44)
AST: 22 IU/L (ref 0–40)
Albumin/Globulin Ratio: 1.7 (ref 1.2–2.2)
Albumin: 4.8 g/dL (ref 3.5–5.5)
BILIRUBIN TOTAL: 0.2 mg/dL (ref 0.0–1.2)
BUN / CREAT RATIO: 11 (ref 9–20)
BUN: 14 mg/dL (ref 6–20)
CHLORIDE: 102 mmol/L (ref 96–106)
CO2: 25 mmol/L (ref 20–29)
Calcium: 10.4 mg/dL — ABNORMAL HIGH (ref 8.7–10.2)
Creatinine, Ser: 1.29 mg/dL — ABNORMAL HIGH (ref 0.76–1.27)
GFR calc Af Amer: 82 mL/min/{1.73_m2} (ref >59)
GFR calc non Af Amer: 71 mL/min/{1.73_m2} (ref >59)
GLUCOSE: 91 mg/dL (ref 65–99)
Globulin, Total: 2.8 g/dL (ref 1.5–4.5)
POTASSIUM: 4.7 mmol/L (ref 3.5–5.2)
Sodium: 142 mmol/L (ref 134–144)
Total Protein: 7.6 g/dL (ref 6.0–8.5)

## 2017-07-26 LAB — LIPID PANEL
CHOL/HDL RATIO: 6.1 ratio — AB (ref 0.0–5.0)
CHOLESTEROL TOTAL: 212 mg/dL — AB (ref 100–199)
HDL: 35 mg/dL — ABNORMAL LOW (ref >39)
TRIGLYCERIDES: 437 mg/dL — AB (ref 0–149)

## 2017-07-26 LAB — VITAMIN B12: Vitamin B-12: 1056 pg/mL (ref 232–1245)

## 2017-07-26 LAB — PHOSPHORUS: PHOSPHORUS: 3.2 mg/dL (ref 2.5–4.5)

## 2017-07-26 LAB — TSH: TSH: 2.97 u[IU]/mL (ref 0.450–4.500)

## 2017-07-26 LAB — MAGNESIUM: Magnesium: 2.1 mg/dL (ref 1.6–2.3)

## 2017-07-26 LAB — HEMOGLOBIN A1C
Est. average glucose Bld gHb Est-mCnc: 120 mg/dL
HEMOGLOBIN A1C: 5.8 % — AB (ref 4.8–5.6)

## 2017-07-26 LAB — VITAMIN D 25 HYDROXY (VIT D DEFICIENCY, FRACTURES): Vit D, 25-Hydroxy: 29.4 ng/mL — ABNORMAL LOW (ref 30.0–100.0)

## 2017-07-26 LAB — T4, FREE: FREE T4: 1.03 ng/dL (ref 0.82–1.77)

## 2017-07-30 DIAGNOSIS — M25512 Pain in left shoulder: Secondary | ICD-10-CM | POA: Insufficient documentation

## 2017-08-06 ENCOUNTER — Telehealth: Payer: Self-pay | Admitting: Family Medicine

## 2017-08-06 ENCOUNTER — Other Ambulatory Visit: Payer: Self-pay

## 2017-08-06 DIAGNOSIS — E781 Pure hyperglyceridemia: Secondary | ICD-10-CM

## 2017-08-06 NOTE — Progress Notes (Signed)
Per lab result note Dr. Sharee Holsterpalski instructions is for patient to come in fasting for redraw of lipid panels due to abnormal results as the patient was not fasting.  Patient notified. MPulliam, CMA/RT(R)

## 2017-08-06 NOTE — Telephone Encounter (Signed)
Called patient please see lab result note. MPulliam, CMA/RT(R)

## 2017-08-06 NOTE — Telephone Encounter (Signed)
Patient called back for lab results 

## 2017-08-08 ENCOUNTER — Other Ambulatory Visit (INDEPENDENT_AMBULATORY_CARE_PROVIDER_SITE_OTHER): Payer: BLUE CROSS/BLUE SHIELD

## 2017-08-08 DIAGNOSIS — E781 Pure hyperglyceridemia: Secondary | ICD-10-CM | POA: Diagnosis not present

## 2017-08-09 LAB — LIPID PANEL
Chol/HDL Ratio: 6.7 ratio — ABNORMAL HIGH (ref 0.0–5.0)
Cholesterol, Total: 228 mg/dL — ABNORMAL HIGH (ref 100–199)
HDL: 34 mg/dL — AB (ref >39)
Triglycerides: 473 mg/dL — ABNORMAL HIGH (ref 0–149)

## 2017-08-15 ENCOUNTER — Ambulatory Visit: Payer: BLUE CROSS/BLUE SHIELD | Admitting: Family Medicine

## 2017-08-15 ENCOUNTER — Encounter: Payer: Self-pay | Admitting: Family Medicine

## 2017-08-15 VITALS — BP 122/76 | HR 109 | Ht 70.98 in | Wt 236.0 lb

## 2017-08-15 DIAGNOSIS — R410 Disorientation, unspecified: Secondary | ICD-10-CM

## 2017-08-15 DIAGNOSIS — F411 Generalized anxiety disorder: Secondary | ICD-10-CM

## 2017-08-15 DIAGNOSIS — E559 Vitamin D deficiency, unspecified: Secondary | ICD-10-CM

## 2017-08-15 DIAGNOSIS — E781 Pure hyperglyceridemia: Secondary | ICD-10-CM | POA: Diagnosis not present

## 2017-08-15 DIAGNOSIS — T300 Burn of unspecified body region, unspecified degree: Secondary | ICD-10-CM

## 2017-08-15 DIAGNOSIS — N289 Disorder of kidney and ureter, unspecified: Secondary | ICD-10-CM | POA: Insufficient documentation

## 2017-08-15 DIAGNOSIS — F332 Major depressive disorder, recurrent severe without psychotic features: Secondary | ICD-10-CM | POA: Diagnosis not present

## 2017-08-15 DIAGNOSIS — F4312 Post-traumatic stress disorder, chronic: Secondary | ICD-10-CM | POA: Diagnosis not present

## 2017-08-15 DIAGNOSIS — R7303 Prediabetes: Secondary | ICD-10-CM | POA: Insufficient documentation

## 2017-08-15 DIAGNOSIS — W868XXA Exposure to other electric current, initial encounter: Secondary | ICD-10-CM

## 2017-08-15 MED ORDER — VITAMIN D (ERGOCALCIFEROL) 1.25 MG (50000 UNIT) PO CAPS: 50000.0000 [IU] | ORAL_CAPSULE | ORAL | 10 refills | Status: DC

## 2017-08-15 MED ORDER — NIACIN ER (ANTIHYPERLIPIDEMIC) 1000 MG PO TBCR: 1000.0000 mg | EXTENDED_RELEASE_TABLET | Freq: Every day | ORAL | 3 refills | Status: DC

## 2017-08-15 MED ORDER — BUSPIRONE HCL 15 MG PO TABS: 15.0000 mg | ORAL_TABLET | Freq: Three times a day (TID) | ORAL | 5 refills | Status: DC

## 2017-08-15 NOTE — Patient Instructions (Addendum)
I want to recheck your BMP which is your kidney function 1 month after your water intake is adequate at more than 7 bottles per day.  Also we will add a calcium to that.    -For your prediabetes we will recheck this in 4 months (along with your fasting lipid profile\cholesterol and vitamin D level. )  When you return for a follow-up office visit.  I encourage you to have some weight loss, exercise regularly to a goal of 30 minutes 5 days a week and decrease her carbohydrate and sugar intake.   The quick and dirty--> lower triglyceride levels more through...  1) - Beware of bad fats: Cutting back on saturated fat (in red meat and full-fat dairy foods) and trans fats (in restaurant fried foods and commercially prepared baked goods) can lower triglycerides.  2) - Go for good carbs: Easily digested carbohydrates (such as white bread, white rice, cornflakes, and sugary sodas) give triglycerides a definite boost.   3) - Eating whole grains and cutting back on soda can help control triglycerides.  4) - Check your alcohol use. In some people, alcohol dramatically boosts triglycerides. The only way to know if this is true for you is to avoid alcohol for a few weeks and have your triglycerides tested again.  5) - Go fish. Omega-3 fats in salmon, tuna, sardines, and other fatty fish can lower triglycerides. Having fish twice a week is fine.  6) - Aim for a healthy weight. If you are overweight, losing just 5% to 10% of your weight can help drive down triglycerides.  7) - Get moving. Exercise lowers triglycerides and boosts heart-healthy HDL cholesterol.  8) - quit smoking if you do  --> for more information, see below; or go to  [email or web address]  and do a search for desired topics   For those diagnosed with high triglycerides, it's important to take action to lower your levels and improve your heart health.  Triglyceride is just a fancy word for fat - the fat in our bodies is stored in the form of  triglycerides. Triglycerides are found in foods and manufactured in our bodies.  Normal triglyceride levels are defined as less than 150 mg/dL; 161 to 096 is considered borderline high; 200 to 499 is high; and 500 or higher is officially called very high. To me, anything over 150 is a red flag indicating my patient needs to take immediate steps to get the situation under control.   What is the significance of high triglycerides? High triglyceride levels make blood thicker and stickier, which means that it is more likely to form clots. Studies have shown that triglyceride levels are associated with increased risks of cardiovascular disease and stroke - in both men and women - alone or in combination with other risk factors (high triglycerides combined with high LDL cholesterol can be a particularly deadly combination). For example, in one ground-breaking study, high triglycerides alone increased the risk of cardiovascular disease by 14 percent in men, and by 37 percent in women. But when the test subjects also had low HDL cholesterol (that's the good cholesterol) and other risk factors, high triglycerides increased the risk of disease by 32 percent in men and 76 percent in women.   Fortunately, triglycerides can sometimes be controlled with several diet and lifestyle changes.    What Factors Can Increase Triglycerides? As with cholesterol, eating too much of the wrong kinds of fats will raise your blood triglycerides.  Therefore, it's important to restrict the  amounts of saturated fats and trans fats you allow into your diet.  Triglyceride levels can also shoot up after eating foods that are high in carbohydrates or after drinking alcohol.  That's why triglyceride blood tests require an overnight fast.  If you have elevated triglycerides, it's especially important to avoid sugary and refined carbohydrates, including sugar, honey, and other sweeteners, soda and other sugary drinks, candy, baked goods, and  anything made with white (refined or enriched) flour, including white bread, rolls, cereals, buns, pastries, regular pasta, and white rice.  You'll also want to limit dried fruit and fruit juice since they're dense in simple sugar.  All of these low-quality carbs cause a sudden rise in insulin, which may lead to a spike in triglycerides.  Triglycerides can also become elevated as a reaction to having diabetes, hypothyroidism, or kidney disease. As with most other heart-related factors, being overweight and inactive also contribute to abnormal triglycerides. And unfortunately, some people have a genetic predisposition that causes them to manufacture way too much triglycerides on their own, no matter how carefully they eat.     How Can You Lower Your Triglyceride Levels? If you are diagnosed with high triglycerides, it's important to take action. There are several things you can do to help lower your triglyceride levels and improve your heart health:  --> Lose weight if you are overweight.  There is a clear correlation between obesity and high triglycerides - the heavier people are, the higher their triglyceride levels are likely to be. The good news is that losing weight can significantly lower triglycerides. In a large study of individuals with type 2 diabetes, those assigned to the "lifestyle intervention group" - which involved counseling, a low-calorie meal plan, and customized exercise program - lost 8.6% of their body weight and lowered their triglyceride levels by more than 16%. If you're overweight, find a weight loss plan that works for you and commit to shedding the pounds and getting healthier.  --> Reduce the amount of saturated fat and trans fat in your diet.  Start by avoiding or dramatically limiting butter, cream cheese, lard, sour cream, doughnuts, cakes, cookies, candy bars, regular ice cream, fried foods, pizza, cheese sauce, cream-based sauces and salad dressings, high-fat meats  (including fatty hamburgers, bologna, pepperoni, sausage, bacon, salami, pastrami, spareribs, and hot dogs), high-fat cuts of beef and pork, and whole-milk dairy products.   Other ways to cut back: Choose lean meats only (including skinless chicken and [place], lean beef, lean pork), fish, and reduced-fat or fat-free dairy products.   Experiment with adding whole soy foods to your diet. Although soy itself may not reduce risk of heart disease, it replaces hazardous animal fats with healthier proteins. Choose high-quality soy foods, such as tofu, tempeh, soy milk, and edamame (whole soybeans).  Always remove skin from poultry.  Prepare foods by baking, roasting, broiling, boiling, poaching, steaming, grilling, or stir-frying in vegetable oil.  Most stick margarines contain trans fats, and trans fats are also found in some packaged baked goods, potato chips, snack foods, fried foods, and fast food that use or create hydrogenated oils.    (All food labels must now list the amount of trans fats, right after the amount of saturated fats - good news for consumers. As a result, many food companies have now reformulated their products to be trans fat free.many, but not all! So it's still just as important to read labels and make sure the packaged foods you buy don't contain trans fats.)  If you use margarine, purchase soft-tub margarine spreads that contain 0 grams trans fats and don't list any partially hydrogenated oils in the ingredients list. By substituting olive oil or vegetable oil for trans fats in just 2 percent of your daily calories, you can reduce your risk of heart disease by 53 percent.   There is no safe amount of trans fats, so try to keep them as far from your plate as possible.  -->  Avoid foods that are concentrated in sugar (even dried fruit and fruit juice). Sugary foods can elevate triglyceride levels in the blood, so keep them to a bare minimum.  --> Swap out refined carbohydrates  for whole grains.  Refined carbohydrates - like white rice, regular pasta, and anything made with white or "enriched" flour (including white bread, rolls, cereals, buns, and crackers) - raise blood sugar and insulin levels more than fiber-rich whole grains. Higher insulin levels, in turn, can lead to a higher rise in triglycerides after a meal. So, make the switch to whole wheat bread, whole grain pasta, brown or wild rice, and whole grain versions of cereals, crackers, and other bread products. However, it's important to know that individuals with high triglycerides should moderate even their intake of high-quality starches (since all starches raise blood sugar) - I recommend 1 to 2 servings per meal.  --> Cut way back on alcohol.  If you have high triglycerides, alcohol should be considered a rare treat - if you indulge at all, since even small amounts of alcohol can dramatically increase triglyceride levels.  --> Incorporate omega-3 fats.  Heart-healthy fish oils are especially rich in omega-3 fatty acids. In multiple studies over the past two decades, people who ate diets high in omega-3s had 30 to 40 percent reductions in heart disease. Although we don't yet know why fish oil works so well, there are several possibilities. Omega-3s seem to reduce inflammation, reduce high blood pressure, decrease triglycerides, raise HDL cholesterol, and make blood thinner and less sticky so it is less likely to clot. It's as close to a food prescription for heart health as it gets. If you have high triglycerides, I recommend eating at least three servings of one of the omega-3-rich fish every week (fatty fish is the most concentrated food form of omega three fats). If you cannot manage to eat that much fish, speak with your physician about taking fish oil capsules, which offer similar benefits.The best foods for omega-3 fatty acids include wild salmon (fresh, canned), herring, mackerel (not king), sardines, anchovies,  rainbow trout, and Pacific oysters. Non-fish sources of omega-3 fats include omega-3-fortified eggs, ground flaxseed, chia seeds, walnuts, butternuts (white walnuts), seaweed, walnut oil, canola oil, and soybeans.  --> Quit smoking.  Smoking causes inflammation, not just in your lungs, but throughout your body. Inflammation can contribute to atherosclerosis, blood clots, and risk of heart attack. Smoking makes all heart health indicators worse. If you have high cholesterol, high triglycerides, or high blood pressure, smoking magnifies the danger.  --> Become more physically active.  Even moderate exercise can help improve cholesterol, triglycerides, and blood pressure. Aerobic exercise seems to be able to stop the sharp rise of triglycerides after eating, perhaps because of a decrease in the amount of triglyceride released by the liver, or because active muscle clears triglycerides out of the blood stream more quickly than inactive muscle. If you haven't exercised regularly (or at all) for years, I recommend starting slowly, by walking at an easy pace for 15 minutes  a day. Then, as you feel more comfortable, increase the amount. Your ultimate goal should be at least 30 minutes of moderate physical activity, at least five days a week.    Nine ways to increase your "good" HDL cholesterol  High-density lipoprotein (HDL) is often referred to as the "good" cholesterol. Having high HDL levels helps carry cholesterol from your arteries to your liver, where it can be used or excreted.  Having high levels of HDL also has antioxidant and anti-inflammatory effects, and is linked to a reduced risk of heart disease (1, 2).  Most health experts recommend minimum blood levels of 40 mg/dl in men and 50 mg/dl in women.  While genetics definitely play a role, there are several other factors that affect HDL levels.  Here are nine healthy ways to raise your "good" HDL cholesterol.  1. Consume olive oil  two  pieces of salmon on a plate olive oil being poured into a small dish Extra virgin olive oil may be more healthful than processed olive oils. Olive oil is one of the healthiest fats around.  A large analysis of 42 studies with more than 800,000 participants found that olive oil was the only source of monounsaturated fat that seemed to reduce heart disease risk (3).  Research has shown that one of olive oil's heart-healthy effects is an increase in HDL cholesterol. This effect is thought to be caused by antioxidants it contains called polyphenols (4, 5, 6, 7).  Extra virgin olive oil has more polyphenols than more processed olive oils, although the amount can still vary among different types and brands.  One study gave 200 healthy young men about 2 tablespoons (25 ml) of different olive oils per day for three weeks.  The researchers found that participants' HDL levels increased significantly more after they consumed the olive oil with the highest polyphenol content (6).  In another study, when 46 older adults consumed about 4 tablespoons (50 ml) of high-polyphenol extra virgin olive oil every day for six weeks, their HDL cholesterol increased by 6.5 mg/dl, on average (7).  In addition to raising HDL levels, olive oil has been found to boost HDL's anti-inflammatory and antioxidant function in studies of older people and individuals with high cholesterol levels ( 7, 8, 9).  Whenever possible, select high-quality, certified extra virgin olive oils, which tend to be highest in polyphenols.  Bottom line: Extra virgin olive oil with a high polyphenol content has been shown to increase HDL levels in healthy people, the elderly and individuals with high cholesterol.  2. Follow a low-carb or ketogenic diet  Low-carb and ketogenic diets provide a number of health benefits, including weight loss and reduced blood sugar levels.  They have also been shown to increase HDL cholesterol in people who tend to  have lower levels.  This includes those who are obese, insulin resistant or diabetic (10, 11, 12, 13, 14, 15, 16, 17).  In one study, people with type 2 diabetes were split into two groups.  One followed a diet consuming less than 50 grams of carbs per day. The other followed a high-carb diet.  Although both groups lost weight, the low-carb group's HDL cholesterol increased almost twice as much as the high-carb group's did (14).  In another study, obese people who followed a low-carb diet experienced an increase in HDL cholesterol of 5 mg/dl overall.  Meanwhile, in the same study, the participants who ate a low-fat, high-carb diet showed a decrease in HDL cholesterol (15).  This  response may partially be due to the higher levels of fat people typically consume on low-carb diets.  One study in overweight women found that diets high in meat and cheese increased HDL levels by 5-8%, compared to a higher-carb diet (18).  What's more, in addition to raising HDL cholesterol, very-low-carb diets have been shown to decrease triglycerides and improve several other risk factors for heart disease (13, 14, 16, 17).  Bottom line: Low-carb and ketogenic diets typically increase HDL cholesterol levels in people with diabetes, metabolic syndrome and obesity.  3. Exercise regularly  Being physically active is important for heart health.  Studies have shown that many different types of exercise are effective at raising HDL cholesterol, including strength training, high-intensity exercise and aerobic exercise (19, 20, 21, 22, 23, 24).  However, the biggest increases in HDL are typically seen with high-intensity exercise.  One small study followed women who were living with polycystic ovary syndrome (PCOS), which is linked to a higher risk of insulin resistance. The study required them to perform high-intensity exercise three times a week.  The exercise led to an increase in HDL cholesterol of 8 mg/dL  after 10 weeks. The women also showed improvements in other health markers, including decreased insulin resistance and improved arterial function (23).  In a 12-week study, overweight men who performed high-intensity exercise experienced a 10% increase in HDL cholesterol.  In contrast, the low-intensity exercise group showed only a 2% increase and the endurance training group experienced no change (24).  However, even lower-intensity exercise seems to increase HDL's anti-inflammatory and antioxidant capacities, whether or not HDL levels change (20, 21, 25).  Overall, high-intensity exercise such as high-intensity interval training (HIIT) and high-intensity circuit training (HICT) may boost HDL cholesterol levels the most.  Bottom line: Exercising several times per week can help raise HDL cholesterol and enhance its anti-inflammatory and antioxidant effects. High-intensity forms of exercise may be especially effective.  4. Add coconut oil to your diet  Studies have shown that coconut oil may reduce appetite, increase metabolic rate and help protect brain health, among other benefits.  Some people may be concerned about coconut oil's effects on heart health due to its high saturated fat content.  However, it appears that coconut oil is actually quite heart healthy.  Coconut oil tends to raise HDL cholesterol more than many other types of fat.  In addition, it may improve the ratio of low-density-lipoprotein (LDL) cholesterol, the "bad" cholesterol, to HDL cholesterol. Improving this ratio reduces heart disease risk (26, 27, 28, 29).  One study examined the health effects of coconut oil on 40 women with excess belly fat. The researchers found that participants who took coconut oil daily experienced increased HDL cholesterol and a lower LDL-to-HDL ratio.  In contrast, the group who took soybean oil daily had a decrease in HDL cholesterol and an increase in the LDL-to-HDL ratio (29).  Most  studies have found these health benefits occur at a dosage of about 2 tablespoons (30 ml) of coconut oil per day. It's best to incorporate this into cooking rather than eating spoonfuls of coconut oil on their own.  Bottom line: Consuming 2 tablespoons (30 ml) of coconut oil per day may help increase HDL cholesterol levels.  5. Stop smoking  cigarette butt Quitting smoking can reduce the risk of heart disease and lung cancer. Smoking increases the risk of many health problems, including heart disease and lung cancer (30).  One of its negative effects is a suppression of HDL  cholesterol.  Some studies have found that quitting smoking can increase HDL levels. Indeed, one study found no significant differences in HDL levels between former smokers and people who had never smoked (31, 32, 33, 34, 35).  In a one-year study of more than 1,500 people, those who quit smoking had twice the increase in HDL as those who resumed smoking within the year. The number of large HDL particles also increased, which further reduced heart disease risk (32).  One study followed smokers who switched from traditional cigarettes to electronic cigarettes for one year. They found that the switch was associated with an increase in HDL cholesterol of 5 mg/dl, on average (33).  When it comes to the effect of nicotine replacement patches on HDL levels, research results have been mixed.  One study found that nicotine replacement therapy led to higher HDL cholesterol. However, other research suggests that people who use nicotine patches likely won't see increases in HDL levels until after replacement therapy is completed (34, 36).  Even in studies where HDL cholesterol levels didn't increase after people quit smoking, HDL function improved, resulting in less inflammation and other beneficial effects on heart health (37).  Bottom line: Quitting smoking can increase HDL levels, improve HDL function and help protect heart  health.  6. Lose weight  When overweight and obese people lose weight, their HDL cholesterol levels usually increase.  What's more, this benefit seems to occur whether weight loss is achieved by calorie counting, carb restriction, intermittent fasting, weight loss surgery or a combination of diet and exercise (16, 38, 39, 40, 41, 42).  One study examined HDL levels in more than 3,000 overweight and obese [place] adults who followed a lifestyle modification program for one year.  The researchers found that losing at least 6.6 lbs (3 kg) led to an increase in HDL cholesterol of 4 mg/dl, on average (41).  In another study, when obese people with type 2 diabetes consumed calorie-restricted diets that provided 20-30% of calories from protein, they experienced significant increases in HDL cholesterol levels (42).  The key to achieving and maintaining healthy HDL cholesterol levels is choosing the type of diet that makes it easiest for you to lose weight and keep it off.  Bottom Line: Several methods of weight loss have been shown to increase HDL cholesterol levels in people who are overweight or obese.  7. Choose purple produce  Consuming purple-colored fruits and vegetables is a delicious way to potentially increase HDL cholesterol.  Purple produce contains antioxidants known as anthocyanins.  Studies using anthocyanin extracts have shown that they help fight inflammation, protect your cells from damaging free radicals and may also raise HDL cholesterol levels (43, 44, 45, 46).  In a 24-week study of 58 people with diabetes, those who took an anthocyanin supplement twice a day experienced a 19% increase in HDL cholesterol, on average, along with other improvements in heart health markers (45).  In another study, when people with cholesterol issues took anthocyanin extract for 12 weeks, their HDL cholesterol levels increased by 13.7% (46).  Although these studies used extracts instead of  foods, there are several fruits and vegetables that are very high in anthocyanins. These include eggplant, purple corn, red cabbage, blueberries, blackberries and black raspberries.  Bottom line: Consuming fruits and vegetables rich in anthocyanins may help increase HDL cholesterol levels.  8. Eat fatty fish often  The omega-3 fats in fatty fish provide major benefits to heart health, including a reduction in inflammation and better functioning  of the cells that line your arteries (47, 48).  There's some research showing that eating fatty fish or taking fish oil may also help raise low levels of HDL cholesterol (49, 50, 51, 52, 53).  In a study of 33 heart disease patients, participants that consumed fatty fish four times per week experienced an increase in HDL cholesterol levels. The particle size of their HDL also increased (52).  In another study, overweight men who consumed herring five days a week for six weeks had a 5% increase in HDL cholesterol, compared with their levels after eating lean pork and chicken five days a week (53).  However, there are a few studies that found no increase in HDL cholesterol in response to increased fish or omega-3 supplement intake (54, 55).  In addition to herring, other types of fatty fish that may help raise HDL cholesterol include salmon, sardines, mackerel and anchovies.  Bottom line: Eating fatty fish several times per week may help increase HDL cholesterol levels and provide other benefits to heart health.  9. Avoid artificial trans fats  Artificial trans fats have many negative health effects due to their inflammatory properties (56, 57).  There are two types of trans fats. One kind occurs naturally in animal products, including full-fat dairy.  In contrast, the artificial trans fats found in margarines and processed foods are created by adding hydrogen to unsaturated vegetable and seed oils. These fats are also known as industrial trans fats or  partially hydrogenated fats.  Research has shown that, in addition to increasing inflammation and contributing to several health problems, these artificial trans fats may lower HDL cholesterol levels.  In one study, researchers compared how people's HDL levels responded when they consumed different margarines.  The study found that participants' HDL cholesterol levels were 10% lower after consuming margarine containing partially hydrogenated soybean oil, compared to their levels after consuming palm oil (58).  Another controlled study followed 40 adults who had diets high in different types of trans fats.  They found that HDL cholesterol levels in women were significantly lower after they consumed the diet high in industrial trans fats, compared to the diet containing naturally occurring trans fats (59).  To protect heart health and keep HDL cholesterol in the healthy range, it's best to avoid artificial trans fats altogether.  Bottom line: Artificial trans fats have been shown to lower HDL levels and increase inflammation, compared to other fats.  Take home message  Although your HDL cholesterol levels are partly determined by your genetics, there are many things you can do to naturally increase your own levels.  Fortunately, the practices that raise HDL cholesterol often provide other health benefits as well.

## 2017-08-15 NOTE — Progress Notes (Signed)
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 mg/dL  Heparin level (unfractionated)     Status: None   Collection Time: 06/22/17  5:40 AM  Result Value Ref Range   Heparin Unfractionated 0.39 0.30 - 0.70 IU/mL    Comment:        IF HEPARIN RESULTS ARE BELOW EXPECTED VALUES, AND PATIENT DOSAGE HAS BEEN CONFIRMED, SUGGEST FOLLOW UP TESTING OF ANTITHROMBIN III LEVELS.   MRSA PCR Screening     Status: None   Collection Time: 06/22/17  8:14 AM  Result Value Ref Range   MRSA by PCR NEGATIVE NEGATIVE    Comment:        The GeneXpert MRSA Assay  (FDA approved for NASAL specimens only), is one component  of a comprehensive MRSA colonization surveillance program. It is not intended to diagnose MRSA infection nor to guide or monitor treatment for MRSA infections.   Heparin level (unfractionated)     Status: None   Collection Time: 06/22/17  1:01 PM  Result Value Ref Range   Heparin Unfractionated 0.50 0.30 - 0.70 IU/mL    Comment:        IF HEPARIN RESULTS ARE BELOW EXPECTED VALUES, AND PATIENT DOSAGE HAS BEEN CONFIRMED, SUGGEST FOLLOW UP TESTING OF ANTITHROMBIN III LEVELS.   Basic metabolic panel     Status: Abnormal   Collection Time: 06/23/17  4:27 AM  Result Value Ref Range   Sodium 133 (L) 135 - 145 mmol/L   Potassium 4.0 3.5 - 5.1 mmol/L   Chloride 97 (L) 101 - 111 mmol/L   CO2 28 22 - 32 mmol/L   Glucose, Bld 108 (H) 65 - 99 mg/dL   BUN 14 6 - 20 mg/dL   Creatinine, Ser 0.82 0.61 - 1.24 mg/dL   Calcium 8.7 (L) 8.9 - 10.3 mg/dL   GFR calc non Af Amer >60 >60 mL/min   GFR calc Af Amer >60 >60 mL/min    Comment: (NOTE) The eGFR has been calculated using the CKD EPI equation. This calculation has not been validated in all clinical situations. eGFR's persistently <60 mL/min signify possible Chronic Kidney Disease.    Anion gap 8 5 - 15  CBC     Status: Abnormal   Collection Time: 06/23/17  4:27 AM  Result Value Ref Range   WBC 11.0 (H) 4.0 - 10.5 K/uL   RBC 3.57 (L) 4.22 - 5.81 MIL/uL   Hemoglobin 10.9 (L) 13.0 - 17.0 g/dL   HCT 32.7 (L) 39.0 - 52.0 %   MCV 91.6 78.0 - 100.0 fL   MCH 30.5 26.0 - 34.0 pg   MCHC 33.3 30.0 - 36.0 g/dL   RDW 12.9 11.5 - 15.5 %   Platelets 493 (H) 150 - 400 K/uL  CBC with Differential/Platelet     Status: Abnormal   Collection Time: 07/25/17  9:10 AM  Result Value Ref Range   WBC 5.5 3.4 - 10.8 x10E3/uL   RBC 4.66 4.14 - 5.80 x10E6/uL   Hemoglobin 13.9 13.0 - 17.7 g/dL   Hematocrit 40.5 37.5 - 51.0 %   MCV 87 79 - 97 fL   MCH 29.8 26.6 - 33.0 pg   MCHC 34.3 31.5 - 35.7  g/dL   RDW 14.3 12.3 - 15.4 %   Platelets 412 (H) 150 - 379 x10E3/uL   Neutrophils 54 Not Estab. %   Lymphs 27 Not Estab. %   Monocytes 11 Not Estab. %   Eos 8 Not Estab. %   Basos 0 Not Estab. %   Neutrophils Absolute 2.9 1.4 - 7.0 x10E3/uL   Lymphocytes Absolute 1.5 0.7 - 3.1 x10E3/uL   Monocytes Absolute 0.6 0.1 - 0.9 x10E3/uL   EOS (ABSOLUTE) 0.5 (H) 0.0 - 0.4 x10E3/uL   Basophils Absolute 0.0 0.0 - 0.2 x10E3/uL   Immature Granulocytes 0 Not Estab. %   Immature Grans (Abs) 0.0 0.0 - 0.1 x10E3/uL  Comprehensive metabolic panel     Status: Abnormal   Collection Time: 07/25/17  9:10 AM  Result Value Ref Range   Glucose 91 65 - 99 mg/dL   BUN 14 6 - 20 mg/dL   Creatinine, Ser 1.29 (H) 0.76 - 1.27 mg/dL   GFR calc non Af Amer 71 >59 mL/min/1.73  GFR calc Af Amer 82 >59 mL/min/1.73   BUN/Creatinine Ratio 11 9 - 20   Sodium 142 134 - 144 mmol/L   Potassium 4.7 3.5 - 5.2 mmol/L   Chloride 102 96 - 106 mmol/L   CO2 25 20 - 29 mmol/L   Calcium 10.4 (H) 8.7 - 10.2 mg/dL   Total Protein 7.6 6.0 - 8.5 g/dL   Albumin 4.8 3.5 - 5.5 g/dL   Globulin, Total 2.8 1.5 - 4.5 g/dL   Albumin/Globulin Ratio 1.7 1.2 - 2.2   Bilirubin Total 0.2 0.0 - 1.2 mg/dL   Alkaline Phosphatase 65 39 - 117 IU/L   AST 22 0 - 40 IU/L   ALT 36 0 - 44 IU/L  Hemoglobin A1c     Status: Abnormal   Collection Time: 07/25/17  9:10 AM  Result Value Ref Range   Hgb A1c MFr Bld 5.8 (H) 4.8 - 5.6 %    Comment:          Prediabetes: 5.7 - 6.4          Diabetes: >6.4          Glycemic control for adults with diabetes: <7.0    Est. average glucose Bld gHb Est-mCnc 120 mg/dL  Lipid panel     Status: Abnormal   Collection Time: 07/25/17  9:10 AM  Result Value Ref Range   Cholesterol, Total 212 (H) 100 - 199 mg/dL   Triglycerides 437 (H) 0 - 149 mg/dL   HDL 35 (L) >39 mg/dL   VLDL Cholesterol Cal Comment 5 - 40 mg/dL    Comment: The calculation for the VLDL cholesterol is not valid when triglyceride level is >400  mg/dL.    LDL Calculated Comment 0 - 99 mg/dL    Comment: Triglyceride result indicated is too high for an accurate LDL cholesterol estimation.    Chol/HDL Ratio 6.1 (H) 0.0 - 5.0 ratio    Comment:                                   T. Chol/HDL Ratio                                             Men  Women                               1/2 Avg.Risk  3.4    3.3                                   Avg.Risk  5.0    4.4                                2X Avg.Risk  9.6    7.1                                3X Avg.Risk 23.4   11.0   Magnesium     Status: None   Collection Time: 07/25/17  9:10 AM  Result Value Ref Range   Magnesium 2.1 1.6 - 2.3  mg/dL  Phosphorus     Status: None   Collection Time: 07/25/17  9:10 AM  Result Value Ref Range   Phosphorus 3.2 2.5 - 4.5 mg/dL  T4, free     Status: None   Collection Time: 07/25/17  9:10 AM  Result Value Ref Range   Free T4 1.03 0.82 - 1.77 ng/dL  TSH     Status: None   Collection Time: 07/25/17  9:10 AM  Result Value Ref Range   TSH 2.970 0.450 - 4.500 uIU/mL  VITAMIN D 25 Hydroxy (Vit-D Deficiency, Fractures)     Status: Abnormal   Collection Time: 07/25/17  9:10 AM  Result Value Ref Range   Vit D, 25-Hydroxy 29.4 (L) 30.0 - 100.0 ng/mL    Comment: Vitamin D deficiency has been defined by the [place] and an Endocrine Society practice guideline as a level of serum 25-OH vitamin D less than 20 ng/mL (1,2). The Endocrine Society went on to further define vitamin D insufficiency as a level between 21 and 29 ng/mL (2). 1. IOM (Institute of Medicine). 2010. Dietary reference    intakes for calcium and D. [place]: The    [employer]. 2. Holick MF, Binkley [place], Bischoff-Ferrari HA, et al.    Evaluation, treatment, and prevention of vitamin D    deficiency: an Endocrine Society clinical practice    guideline. JCEM. 2011 Jul; 96(7):1911-30.   Vitamin B12     Status: None   Collection Time: 07/25/17  9:10 AM   Result Value Ref Range   Vitamin B-12 1,056 232 - 1,245 pg/mL  Lipid panel     Status: Abnormal   Collection Time: 08/08/17  8:45 AM  Result Value Ref Range   Cholesterol, Total 228 (H) 100 - 199 mg/dL   Triglycerides 473 (H) 0 - 149 mg/dL   HDL 34 (L) >39 mg/dL   VLDL Cholesterol Cal Comment 5 - 40 mg/dL    Comment: The calculation for the VLDL cholesterol is not valid when triglyceride level is >400 mg/dL.    LDL Calculated Comment 0 - 99 mg/dL    Comment: Triglyceride result indicated is too high for an accurate LDL cholesterol estimation.    Chol/HDL Ratio 6.7 (H) 0.0 - 5.0 ratio    Comment:                                   T. Chol/HDL Ratio                                             Men  Women                               1/2 Avg.Risk  3.4    3.3                                   Avg.Risk  5.0    4.4                                2X Avg.Risk  9.6    7.1  3X Avg.Risk 23.4   11.0

## 2017-08-23 DIAGNOSIS — F4310 Post-traumatic stress disorder, unspecified: Secondary | ICD-10-CM | POA: Diagnosis not present

## 2017-09-04 DIAGNOSIS — F4310 Post-traumatic stress disorder, unspecified: Secondary | ICD-10-CM | POA: Diagnosis not present

## 2017-09-18 ENCOUNTER — Other Ambulatory Visit (INDEPENDENT_AMBULATORY_CARE_PROVIDER_SITE_OTHER): Payer: BLUE CROSS/BLUE SHIELD

## 2017-09-18 DIAGNOSIS — E781 Pure hyperglyceridemia: Secondary | ICD-10-CM | POA: Diagnosis not present

## 2017-09-18 DIAGNOSIS — R7303 Prediabetes: Secondary | ICD-10-CM | POA: Diagnosis not present

## 2017-09-18 DIAGNOSIS — F4310 Post-traumatic stress disorder, unspecified: Secondary | ICD-10-CM | POA: Diagnosis not present

## 2017-09-18 DIAGNOSIS — N289 Disorder of kidney and ureter, unspecified: Secondary | ICD-10-CM

## 2017-09-19 LAB — BASIC METABOLIC PANEL
BUN/Creatinine Ratio: 12 (ref 9–20)
BUN: 15 mg/dL (ref 6–20)
CHLORIDE: 102 mmol/L (ref 96–106)
CO2: 26 mmol/L (ref 20–29)
Calcium: 9.4 mg/dL (ref 8.7–10.2)
Creatinine, Ser: 1.22 mg/dL (ref 0.76–1.27)
GFR calc Af Amer: 88 mL/min/{1.73_m2} (ref >59)
GFR, EST NON AFRICAN AMERICAN: 76 mL/min/{1.73_m2} (ref >59)
GLUCOSE: 136 mg/dL — AB (ref 65–99)
POTASSIUM: 4.7 mmol/L (ref 3.5–5.2)
SODIUM: 140 mmol/L (ref 134–144)

## 2017-09-19 LAB — LIPID PANEL
CHOLESTEROL TOTAL: 212 mg/dL — AB (ref 100–199)
Chol/HDL Ratio: 6.1 ratio — ABNORMAL HIGH (ref 0.0–5.0)
HDL: 35 mg/dL — ABNORMAL LOW (ref >39)
LDL Calculated: 105 mg/dL — ABNORMAL HIGH (ref 0–99)
Triglycerides: 361 mg/dL — ABNORMAL HIGH (ref 0–149)
VLDL Cholesterol Cal: 72 mg/dL — ABNORMAL HIGH (ref 5–40)

## 2017-09-19 LAB — VITAMIN D 25 HYDROXY (VIT D DEFICIENCY, FRACTURES): VIT D 25 HYDROXY: 39.3 ng/mL (ref 30.0–100.0)

## 2017-09-19 LAB — HEMOGLOBIN A1C
ESTIMATED AVERAGE GLUCOSE: 131 mg/dL
HEMOGLOBIN A1C: 6.2 % — AB (ref 4.8–5.6)

## 2017-10-22 ENCOUNTER — Telehealth: Payer: Self-pay | Admitting: Hematology and Oncology

## 2017-10-22 ENCOUNTER — Encounter: Payer: Self-pay | Admitting: Hematology and Oncology

## 2017-10-22 NOTE — Telephone Encounter (Signed)
Appt has been scheduled for the pt to see Dr. Gweneth Dimitri on 5/13 at 2pm. Spoke to the pt's nurse case manager, Edrick Oh and gave the appt date and time. Letter mailed.

## 2017-10-30 DIAGNOSIS — M542 Cervicalgia: Secondary | ICD-10-CM | POA: Insufficient documentation

## 2017-11-05 ENCOUNTER — Encounter: Payer: Self-pay | Admitting: Hematology and Oncology

## 2017-11-05 ENCOUNTER — Telehealth: Payer: Self-pay | Admitting: Hematology and Oncology

## 2017-11-05 ENCOUNTER — Inpatient Hospital Stay: Payer: BLUE CROSS/BLUE SHIELD | Attending: Hematology and Oncology | Admitting: Hematology and Oncology

## 2017-11-05 VITALS — BP 137/87 | HR 103 | Temp 97.7°F | Resp 18 | Ht 70.98 in | Wt 245.7 lb

## 2017-11-05 DIAGNOSIS — I8290 Acute embolism and thrombosis of unspecified vein: Secondary | ICD-10-CM | POA: Diagnosis not present

## 2017-11-05 DIAGNOSIS — Z7901 Long term (current) use of anticoagulants: Secondary | ICD-10-CM | POA: Diagnosis not present

## 2017-11-05 DIAGNOSIS — I2699 Other pulmonary embolism without acute cor pulmonale: Secondary | ICD-10-CM | POA: Diagnosis not present

## 2017-11-05 DIAGNOSIS — K2270 Barrett's esophagus without dysplasia: Secondary | ICD-10-CM | POA: Insufficient documentation

## 2017-11-05 NOTE — Telephone Encounter (Signed)
Return if symptoms worsen or fail to improve per 5/13 los °

## 2017-11-21 NOTE — Assessment & Plan Note (Signed)
37 y.o.-year-old male with history of Barrett's esophagus, but otherwise healthy who has developed a suspected pulmonary embolism following accidental electrocution resulting in left humeral fracture and shoulder dislocation which required surgical repair and extremity immobilization. Since the event, patient has been receiving therapeutic anticoagulation with rivaroxaban.  This is a clearly provoked thrombotic event with likely source of emboli in the immobilized upper extremity which has not been imaged with ultrasounds due to presence of immobilizing devices.  No previous history of thrombosis in this patient, nor any history of thromboembolic phenomena and his family make additional assessment for possible acquired or inherited thrombophilia unlikely to result in any significant findings.  Recommendations: - Due to provoked nature of the event, recommend discontinuation of therapeutic anticoagulation after 3 months of treatment - No need for thrombophilia work-up at this point in time. - Return to our clinic as needed if recurrent thrombotic events are observed.

## 2017-11-21 NOTE — Progress Notes (Signed)
[facility] Visit:  Assessment: Pulmonary embolus and infarction (HCC) 37 y.o.-year-old male with history of Barrett's esophagus, but otherwise healthy who has developed a suspected pulmonary embolism following accidental electrocution resulting in left humeral fracture and shoulder dislocation which required surgical repair and extremity immobilization. Since the event, patient has been receiving therapeutic anticoagulation with rivaroxaban.  This is a clearly provoked thrombotic event with likely source of emboli in the immobilized upper extremity which has not been imaged with ultrasounds due to presence of immobilizing devices.  No previous history of thrombosis in this patient, nor any history of thromboembolic phenomena and his family make additional assessment for possible acquired or inherited thrombophilia unlikely to result in any significant findings.  Recommendations: - Due to provoked nature of the event, recommend discontinuation of therapeutic anticoagulation after 3 months of treatment - No need for thrombophilia work-up at this point in time. - Return to our clinic as needed if recurrent thrombotic events are observed.   Voice recognition software was used and creation of this note. Despite my best effort at editing the text, some misspelling/errors may have occurred. No orders of the defined types were placed in this encounter.   All questions were answered.  . The patient knows to call the clinic with any problems, questions or concerns.  This note was electronically signed.    History of Presenting Illness [facility] 37 y.o. [facility] for recommendations regarding anticoagulation and possible thrombophilia work-up, referred by Dr Claretta Fraise.  His past medical history significant for Barrett's esophagus, and otherwise negative.  Family history significant for father with Barrett's esophagus with development of esophageal  cancer later in his life.  Patient was at his work where he underwent accidental letter fusion on 05/08/2017 which resulted in severe musculoskeletal injury outlined below.  Subsequently, patient given surgical repair of the injuries and was placed in the cast for his left upper extremity.  Subsequently, patient has developed pleuritic chest pain and shortness of breath.  CT of the chest was suspicious, but not confirmative of a pulmonary embolism and possible pulmonary infarct.  Only lower extremities were imaged with Dopplers and were negative for deep vein thrombosis.  Patient did have some post injury swelling of the left upper extremity while it was in the cast.  Oncological/hematological History: **VTE, provoked DVT UE and PE: --Event #1, 06/20/17: Patient was electrocuted at work on 05/08/17 resulting in the humeral fracture and left shoulder dislocation.  Subsequently, patient underwent surgery on 06/12/2017 was placed in the cast/immobilized.  On the day of presentation, patient has developed shortness of breath in the right scapular pain and was found to have evidence of pneumonia and pulmonary emboli on assessment.  --Admission, 12/26-30/18: Chart from hospital and rivaroxaban, continues to take medication at the present time.  --CTA Chest, 06/20/17: The segmental pulmonary artery branches to the right lower lobe are asymmetrically prominent in size which could be a secondary sign of obstructing pulmonary embolism. Also, the dense consolidation at the right lung base could be sequela of associated pulmonary infarction.  --Doppler US BL LE, 06/22/17: Right -- There is no evidence of deep vein thrombosis in the lower extremity. No cystic structure found in the popliteal fossa. Left -- There is no evidence of deep vein thrombosis in the lower extremity. No cystic structure found in the popliteal fossa.  Medical History: Past Medical History:  Diagnosis Date  . Barrett's esophagus   .  Depression   .  GERD (gastroesophageal reflux disease)     Surgical History: Past Surgical History:  Procedure Laterality Date  . SHOULDER SURGERY    . WRIST SURGERY      Family History: Family History  Problem Relation Age of Onset  . Depression Mother   . Diabetes Mother   . Cancer Father        gsophageal  . Depression Father   . Depression Sister   . Depression Brother     Social History: Social History   Socioeconomic History  . Marital status: Married    Spouse name: Not on file  . Number of children: Not on file  . Years of education: Not on file  . Highest education level: Not on file  Occupational History  . Not on file  Social Needs  . Financial resource strain: Not on file  . Food insecurity:    Worry: Not on file    Inability: Not on file  . Transportation needs:    Medical: Not on file    Non-medical: Not on file  Tobacco Use  . Smoking status: Former Smoker    Years: 3.00    Last attempt to quit: 05/08/2016    Years since quitting: 1.5  . Smokeless tobacco: Never Used  Substance and Sexual Activity  . Alcohol use: No    Frequency: Never  . Drug use: No  . Sexual activity: Yes  Lifestyle  . Physical activity:    Days per week: Not on file    Minutes per session: Not on file  . Stress: Not on file  Relationships  . Social connections:    Talks on phone: Not on file    Gets together: Not on file    Attends religious service: Not on file    Active member of club or organization: Not on file    Attends meetings of clubs or organizations: Not on file    Relationship status: Not on file  . Intimate partner violence:    Fear of current or ex partner: Not on file    Emotionally abused: Not on file    Physically abused: Not on file    Forced sexual activity: Not on file  Other Topics Concern  . Not on file  Social History Narrative  . Not on file    Allergies: No Known Allergies  Medications:  Current Outpatient Medications   Medication Sig Dispense Refill  . busPIRone (BUSPAR) 15 MG tablet Take 1 tablet (15 mg total) by mouth 3 (three) times daily. 90 tablet 5  . Cholecalciferol (VITAMIN D) 2000 units tablet Take 2,000 Units by mouth daily.    . Multiple Vitamins-Minerals (MULTIVITAMIN ADULTS) TABS Take 1 tablet by mouth daily.    . niacin (NIASPAN) 1000 MG CR tablet Take 1 tablet (1,000 mg total) by mouth at bedtime. 90 tablet 3  . omeprazole (PRILOSEC) 40 MG capsule Take 40 mg by mouth daily.   0  . rivaroxaban (XARELTO) 20 MG TABS tablet Take 1 tablet by mouth daily.    . Venlafaxine HCl 225 MG TB24 Take 1 tablet by mouth daily.   0  . Vitamin D, Ergocalciferol, (DRISDOL) 50000 units CAPS capsule Take 1 capsule (50,000 Units total) by mouth every 7 (seven) days. 12 capsule 10   No current facility-administered medications for this visit.     Review of Systems: Review of Systems  Cardiovascular: Positive for chest pain.  Musculoskeletal: Positive for back pain.  All other systems reviewed and are  negative.    PHYSICAL EXAMINATION Blood pressure 137/87, pulse (!) 103, temperature 97.7 F (36.5 C), temperature source Oral, resp. rate 18, height 5' 10.98" (1.803 m), weight 245 lb 11.2 oz (111.4 kg), SpO2 99 %.  ECOG PERFORMANCE STATUS: 1 - Symptomatic but completely ambulatory  Physical Exam  Constitutional: He is oriented to person, place, and time. He appears well-developed and well-nourished. No distress.  HENT:  Head: Atraumatic.  Mouth/Throat: Oropharynx is clear and moist. No oropharyngeal exudate.  Eyes: Pupils are equal, round, and reactive to light. Conjunctivae and EOM are normal. No scleral icterus.  Neck: No thyromegaly present.  Cardiovascular: Normal rate, regular rhythm, normal heart sounds and intact distal pulses. Exam reveals no gallop and no friction rub.  No murmur heard. Pulmonary/Chest: Effort normal and breath sounds normal. No stridor. No respiratory distress. He has no  wheezes. He has no rales.  Abdominal: Soft. Bowel sounds are normal. He exhibits no distension and no mass. There is no tenderness. There is no guarding.  Musculoskeletal: He exhibits no edema.  Lymphadenopathy:    He has no cervical adenopathy.  Neurological: He is alert and oriented to person, place, and time. He displays normal reflexes. No cranial nerve deficit or sensory deficit.  Skin: Skin is dry. No rash noted. He is not diaphoretic. No erythema. No pallor.     LABORATORY DATA: I have personally reviewed the data as listed: No visits with results within 1 Week(s) from this visit.  Latest known visit with results is:  Lab on 09/18/2017  Component Date Value Ref Range Status  . Cholesterol, Total 09/18/2017 212* 100 - 199 mg/dL Final  . Triglycerides 09/18/2017 361* 0 - 149 mg/dL Final  . HDL 16/03/9603 35* >39 mg/dL Final  . VLDL Cholesterol Cal 09/18/2017 72* 5 - 40 mg/dL Final  . LDL Calculated 09/18/2017 540* 0 - 99 mg/dL Final  . Chol/HDL Ratio 09/18/2017 6.1* 0.0 - 5.0 ratio Final   Comment:                                   T. Chol/HDL Ratio                                             Men  Women                               1/2 Avg.Risk  3.4    3.3                                   Avg.Risk  5.0    4.4                                2X Avg.Risk  9.6    7.1                                3X Avg.Risk 23.4   11.0   . Hgb A1c MFr Bld 09/18/2017 6.2* 4.8 - 5.6 % Final   Comment:  Prediabetes: 5.7 - 6.4          Diabetes: >6.4          Glycemic control for adults with diabetes: <7.0   . Est. average glucose Bld gHb Est-m* 09/18/2017 131  mg/dL Final  . Vit D, 16-XWRUEAV 09/18/2017 39.3  30.0 - 100.0 ng/mL Final   Comment: Vitamin D deficiency has been defined by the Institute of Medicine and an Endocrine Society practice guideline as a level of serum 25-OH vitamin D less than 20 ng/mL (1,2). The Endocrine Society went on to further define vitamin D insufficiency  as a level between 21 and 29 ng/mL (2). 1. IOM (Institute of Medicine). 2010. Dietary reference    intakes for calcium and D. [place]: The    [employer]. 2. Holick MF, Binkley [place], Bischoff-Ferrari HA, et al.    Evaluation, treatment, and prevention of vitamin D    deficiency: an Endocrine Society clinical practice    guideline. JCEM. 2011 Jul; 96(7):1911-30.   Marland Kitchen Glucose 09/18/2017 136* 65 - 99 mg/dL Final  . BUN 40/98/1191 15  6 - 20 mg/dL Final  . Creatinine, Ser 09/18/2017 1.22  0.76 - 1.27 mg/dL Final  . GFR calc non Af Amer 09/18/2017 76  >59 mL/min/1.73 Final  . GFR calc Af Amer 09/18/2017 88  >59 mL/min/1.73 Final  . BUN/Creatinine Ratio 09/18/2017 12  9 - 20 Final  . Sodium 09/18/2017 140  134 - 144 mmol/L Final  . Potassium 09/18/2017 4.7  3.5 - 5.2 mmol/L Final  . Chloride 09/18/2017 102  96 - 106 mmol/L Final  . CO2 09/18/2017 26  20 - 29 mmol/L Final  . Calcium 09/18/2017 9.4  8.7 - 10.2 mg/dL Final         Daisy Blossom, MD

## 2017-12-04 ENCOUNTER — Telehealth: Payer: Self-pay | Admitting: Family Medicine

## 2017-12-04 NOTE — Telephone Encounter (Signed)
Patient is requesting a refill of his omeprazole and venlafaxine, on the venlafaxine patient is requesting a lower dosage if applicable. Patient would like to hopefully come off this med and hopes a lower dosage will help him wean off of it. If approved [facility] to [facility] [place]

## 2017-12-05 ENCOUNTER — Other Ambulatory Visit: Payer: Self-pay

## 2017-12-05 MED ORDER — OMEPRAZOLE 20 MG PO CPDR: 20.0000 mg | DELAYED_RELEASE_CAPSULE | Freq: Every day | ORAL | 1 refills | Status: DC

## 2017-12-05 MED ORDER — VENLAFAXINE HCL ER 225 MG PO TB24: 1.0000 | ORAL_TABLET | Freq: Every day | ORAL | 0 refills | Status: DC

## 2017-12-05 NOTE — Telephone Encounter (Signed)
Please tell pt we can discuss the R/B of the med and discuss things at his f/up.  Only gave him 30d supply incase we decide to switch dose after meeting together.   THnx

## 2017-12-05 NOTE — Telephone Encounter (Signed)
Sent refill request to Dr. Opalski to review. MPulliam, CMA/RT(R)  

## 2017-12-05 NOTE — Telephone Encounter (Signed)
Patient called requesting refills on Omeprazole and Venlafaxine.  Reviewed the chart, patient was last seen on 07/28/2017 and patient has a follow up appointment on 12/19/2017.  Omeprazole was last filled by a previous provider.  And patient is requesting a lower dose of the Venlafaxine, he states that he would like to slowly come off of this medication.  Please advise. MPulliam, CMA/RT(R)

## 2017-12-07 NOTE — Telephone Encounter (Signed)
Called patient, no answer unable to leave message. MPulliam, CMA/RT(R)

## 2017-12-12 ENCOUNTER — Other Ambulatory Visit (INDEPENDENT_AMBULATORY_CARE_PROVIDER_SITE_OTHER): Payer: BLUE CROSS/BLUE SHIELD

## 2017-12-12 DIAGNOSIS — R7989 Other specified abnormal findings of blood chemistry: Secondary | ICD-10-CM

## 2017-12-12 DIAGNOSIS — R042 Hemoptysis: Secondary | ICD-10-CM | POA: Diagnosis not present

## 2017-12-12 DIAGNOSIS — E781 Pure hyperglyceridemia: Secondary | ICD-10-CM | POA: Diagnosis not present

## 2017-12-12 DIAGNOSIS — Z862 Personal history of diseases of the blood and blood-forming organs and certain disorders involving the immune mechanism: Secondary | ICD-10-CM

## 2017-12-12 DIAGNOSIS — R7303 Prediabetes: Secondary | ICD-10-CM

## 2017-12-13 LAB — BASIC METABOLIC PANEL
BUN/Creatinine Ratio: 14 (ref 9–20)
BUN: 17 mg/dL (ref 6–20)
CHLORIDE: 99 mmol/L (ref 96–106)
CO2: 24 mmol/L (ref 20–29)
CREATININE: 1.24 mg/dL (ref 0.76–1.27)
Calcium: 10 mg/dL (ref 8.7–10.2)
GFR calc Af Amer: 87 mL/min/{1.73_m2} (ref >59)
GFR calc non Af Amer: 75 mL/min/{1.73_m2} (ref >59)
GLUCOSE: 114 mg/dL — AB (ref 65–99)
Potassium: 5 mmol/L (ref 3.5–5.2)
Sodium: 140 mmol/L (ref 134–144)

## 2017-12-13 LAB — CBC WITH DIFFERENTIAL/PLATELET
BASOS ABS: 0 10*3/uL (ref 0.0–0.2)
Basos: 1 %
EOS (ABSOLUTE): 0.4 10*3/uL (ref 0.0–0.4)
Eos: 6 %
HEMATOCRIT: 47.6 % (ref 37.5–51.0)
HEMOGLOBIN: 16.2 g/dL (ref 13.0–17.7)
IMMATURE GRANULOCYTES: 1 %
Immature Grans (Abs): 0 10*3/uL (ref 0.0–0.1)
Lymphocytes Absolute: 1.4 10*3/uL (ref 0.7–3.1)
Lymphs: 22 %
MCH: 30.5 pg (ref 26.6–33.0)
MCHC: 34 g/dL (ref 31.5–35.7)
MCV: 90 fL (ref 79–97)
MONOCYTES: 9 %
Monocytes Absolute: 0.6 10*3/uL (ref 0.1–0.9)
NEUTROS PCT: 61 %
Neutrophils Absolute: 3.9 10*3/uL (ref 1.4–7.0)
Platelets: 467 10*3/uL — ABNORMAL HIGH (ref 150–450)
RBC: 5.31 x10E6/uL (ref 4.14–5.80)
RDW: 13 % (ref 12.3–15.4)
WBC: 6.2 10*3/uL (ref 3.4–10.8)

## 2017-12-13 LAB — LIPID PANEL
CHOLESTEROL TOTAL: 225 mg/dL — AB (ref 100–199)
Chol/HDL Ratio: 7.8 ratio — ABNORMAL HIGH (ref 0.0–5.0)
HDL: 29 mg/dL — ABNORMAL LOW (ref >39)
Triglycerides: 407 mg/dL — ABNORMAL HIGH (ref 0–149)

## 2017-12-13 LAB — HEMOGLOBIN A1C
ESTIMATED AVERAGE GLUCOSE: 140 mg/dL
Hgb A1c MFr Bld: 6.5 % — ABNORMAL HIGH (ref 4.8–5.6)

## 2017-12-19 ENCOUNTER — Ambulatory Visit: Payer: BLUE CROSS/BLUE SHIELD | Admitting: Family Medicine

## 2017-12-19 ENCOUNTER — Encounter: Payer: Self-pay | Admitting: Family Medicine

## 2017-12-19 VITALS — BP 129/78 | HR 96 | Ht 71.0 in | Wt 242.6 lb

## 2017-12-19 DIAGNOSIS — R7989 Other specified abnormal findings of blood chemistry: Secondary | ICD-10-CM

## 2017-12-19 DIAGNOSIS — F4312 Post-traumatic stress disorder, chronic: Secondary | ICD-10-CM

## 2017-12-19 DIAGNOSIS — F332 Major depressive disorder, recurrent severe without psychotic features: Secondary | ICD-10-CM

## 2017-12-19 DIAGNOSIS — E669 Obesity, unspecified: Secondary | ICD-10-CM

## 2017-12-19 DIAGNOSIS — R7303 Prediabetes: Secondary | ICD-10-CM

## 2017-12-19 DIAGNOSIS — E781 Pure hyperglyceridemia: Secondary | ICD-10-CM | POA: Diagnosis not present

## 2017-12-19 DIAGNOSIS — E786 Lipoprotein deficiency: Secondary | ICD-10-CM | POA: Diagnosis not present
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
## 2017-12-19 NOTE — Patient Instructions (Addendum)
I want you to do [employer]-up and square breathing 4-5 times per day.  10 square breathing each time.    -Also we need to transition your brain into thinking more positively.  These tasks below are some things I want you to do every day 1)  write 3 new things that you are grateful for every day for 21 days  2)  exercise daily- walk for 15 minutes twice a day every day 3)  you are going to journal every day about one positive experience that you had 4)  meditate every day.  You can go on YouTube and look for 15-minute relaxation meditation or what ever.  But we need to make sure that you are in the moment and relaxing and deep breathing every day 5)  Write 1 positive email every day to praise someone in your life     - If you have insomnia or difficulty sleeping, this information is for you:  - Avoid caffeinated beverages after lunch,  no alcoholic beverages,  no eating within 2-3 hours of lying down,  avoid exposure to blue light before bed,  avoid daytime naps, and  needs to maintain a regular sleep schedule- go to sleep and wake up around the same time every night.   - Resolve concerns or worries before entering bedroom:  Discussed relaxation techniques with patient and to keep a journal to write down fears\ worries.  I suggested seeing a counselor for CBT.   - Recommend patient meditate or do deep breathing exercises to help relax.   Incorporate the use of white noise machines or listen to "sleep meditation music", or recordings of guided meditations for sleep from YouTube which are free, such as  "guided meditation for detachment from over thinking"  by Ina Kick.     The quick and dirty--> lower triglyceride levels more through...  1) - Beware of bad fats: Cutting back on saturated fat (in red meat and full-fat dairy foods) and trans fats (in restaurant fried foods and commercially prepared baked goods) can lower triglycerides.  2) - Go for good carbs: Easily digested  carbohydrates (such as white bread, white rice, cornflakes, and sugary sodas) give triglycerides a definite boost.   3) - Eating whole grains and cutting back on soda can help control triglycerides.  4) - Check your alcohol use. In some people, alcohol dramatically boosts triglycerides. The only way to know if this is true for you is to avoid alcohol for a few weeks and have your triglycerides tested again.  5) - Go fish. Omega-3 fats in salmon, tuna, sardines, and other fatty fish can lower triglycerides. Having fish twice a week is fine.  6) - Aim for a healthy weight. If you are overweight, losing just 5% to 10% of your weight can help drive down triglycerides.  7) - Get moving. Exercise lowers triglycerides and boosts heart-healthy HDL cholesterol.  8) - quit smoking if you do  --> for more information, see below; or go to  www.heart.org  and do a search for desired topics   For those diagnosed with high triglycerides, its important to take action to lower your levels and improve your heart health.  Triglyceride is just a fancy word for fat -- the fat in our bodies is stored in the form of triglycerides. Triglycerides are found in foods and manufactured in our bodies.  Normal triglyceride levels are defined as less than 150 mg/dL; 865 to 784 is considered borderline high; 200 to  499 is high; and 500 or higher is officially called very high. To me, anything over 150 is a red flag indicating my patient needs to take immediate steps to get the situation under control.   What is the significance of high triglycerides? High triglyceride levels make blood thicker and stickier, which means that it is more likely to form clots. Studies have shown that triglyceride levels are associated with increased risks of cardiovascular disease and stroke -- in both men and women -- alone or in combination with other risk factors (high triglycerides combined with high LDL cholesterol can be a particularly  deadly combination). For example, in one ground-breaking study, high triglycerides alone increased the risk of cardiovascular disease by 14 percent in men, and by 37 percent in women. But when the test subjects also had low HDL cholesterol (thats the good cholesterol) and other risk factors, high triglycerides increased the risk of disease by 32 percent in men and 76 percent in women.   Fortunately, triglycerides can sometimes be controlled with several diet and lifestyle changes.    What Factors Can Increase Triglycerides? As with cholesterol, eating too much of the wrong kinds of fats will raise your blood triglycerides.  Therefore, its important to restrict the amounts of saturated fats and trans fats you allow into your diet.  Triglyceride levels can also shoot up after eating foods that are high in carbohydrates or after drinking alcohol.  Thats why triglyceride blood tests require an overnight fast.  If you have elevated triglycerides, its especially important to avoid sugary and refined carbohydrates, including sugar, honey, and other sweeteners, soda and other sugary drinks, candy, baked goods, and anything made with white (refined or enriched) flour, including white bread, rolls, cereals, buns, pastries, regular pasta, and white rice.  Youll also want to limit dried fruit and fruit juice since theyre dense in simple sugar.  All of these low-quality carbs cause a sudden rise in insulin, which may lead to a spike in triglycerides.  Triglycerides can also become elevated as a reaction to having diabetes, hypothyroidism, or kidney disease. As with most other heart-related factors, being overweight and inactive also contribute to abnormal triglycerides. And unfortunately, some people have a genetic predisposition that causes them to manufacture way too much triglycerides on their own, no matter how carefully they eat.     How Can You Lower Your Triglyceride Levels? If you are diagnosed with  high triglycerides, its important to take action. There are several things you can do to help lower your triglyceride levels and improve your heart health:  --> Lose weight if you are overweight.  There is a clear correlation between obesity and high triglycerides -- the heavier people are, the higher their triglyceride levels are likely to be. The good news is that losing weight can significantly lower triglycerides. In a large study of individuals with type 2 diabetes, those assigned to the lifestyle intervention group -- which involved counseling, a low-calorie meal plan, and customized exercise program -- lost 8.6% of their body weight and lowered their triglyceride levels by more than 16%. If youre overweight, find a weight loss plan that works for you and commit to shedding the pounds and getting healthier.  --> Reduce the amount of saturated fat and trans fat in your diet.  Start by avoiding or dramatically limiting butter, cream cheese, lard, sour cream, doughnuts, cakes, cookies, candy bars, regular ice cream, fried foods, pizza, cheese sauce, cream-based sauces and salad dressings, high-fat meats (including fatty  hamburgers, bologna, pepperoni, sausage, bacon, salami, pastrami, spareribs, and hot dogs), high-fat cuts of beef and pork, and whole-milk dairy products.   Other ways to cut back: Choose lean meats only (including skinless chicken and [place], lean beef, lean pork), fish, and reduced-fat or fat-free dairy products.   Experiment with adding whole soy foods to your diet. Although soy itself may not reduce risk of heart disease, it replaces hazardous animal fats with healthier proteins. Choose high-quality soy foods, such as tofu, tempeh, soy milk, and edamame (whole soybeans).  Always remove skin from poultry.  Prepare foods by baking, roasting, broiling, boiling, poaching, steaming, grilling, or stir-frying in vegetable oil.  Most stick margarines contain trans fats, and trans  fats are also found in some packaged baked goods, potato chips, snack foods, fried foods, and fast food that use or create hydrogenated oils.    (All food labels must now list the amount of trans fats, right after the amount of saturated fats -- good news for consumers. As a result, many food companies have now reformulated their products to be trans fat freemany, but not all! So its still just as important to read labels and make sure the packaged foods you buy dont contain trans fats.)     If you use margarine, purchase soft-tub margarine spreads that contain 0 grams trans fats and dont list any partially hydrogenated oils in the ingredients list. By substituting olive oil or vegetable oil for trans fats in just 2 percent of your daily calories, you can reduce your risk of heart disease by 53 percent.   There is no safe amount of trans fats, so try to keep them as far from your plate as possible.  -->  Avoid foods that are concentrated in sugar (even dried fruit and fruit juice). Sugary foods can elevate triglyceride levels in the blood, so keep them to a bare minimum.  --> Swap out refined carbohydrates for whole grains.  Refined carbohydrates -- like white rice, regular pasta, and anything made with white or enriched flour (including white bread, rolls, cereals, buns, and crackers) -- raise blood sugar and insulin levels more than fiber-rich whole grains. Higher insulin levels, in turn, can lead to a higher rise in triglycerides after a meal. So, make the switch to whole wheat bread, whole grain pasta, brown or wild rice, and whole grain versions of cereals, crackers, and other bread products. However, its important to know that individuals with high triglycerides should moderate even their intake of high-quality starches (since all starches raise blood sugar) -- I recommend 1 to 2 servings per meal.  --> Cut way back on alcohol.  If you have high triglycerides, alcohol should be considered a  rare treat -- if you indulge at all, since even small amounts of alcohol can dramatically increase triglyceride levels.  --> Incorporate omega-3 fats.  Heart-healthy fish oils are especially rich in omega-3 fatty acids. In multiple studies over the past two decades, people who ate diets high in omega-3s had 30 to 40 percent reductions in heart disease. Although we dont yet know why fish oil works so well, there are several possibilities. Omega-3s seem to reduce inflammation, reduce high blood pressure, decrease triglycerides, raise HDL cholesterol, and make blood thinner and less sticky so it is less likely to clot. Its as close to a food prescription for heart health as it gets. If you have high triglycerides, I recommend eating at least three servings of one of the omega-3-rich fish every week (  fatty fish is the most concentrated food form of omega three fats). If you cannot manage to eat that much fish, speak with your physician about taking fish oil capsules, which offer similar benefits.The best foods for omega-3 fatty acids include wild salmon (fresh, canned), herring, mackerel (not king), sardines, anchovies, rainbow trout, and Pacific oysters. Non-fish sources of omega-3 fats include omega-3-fortified eggs, ground flaxseed, chia seeds, walnuts, butternuts (white walnuts), seaweed, walnut oil, canola oil, and soybeans.  --> Quit smoking.  Smoking causes inflammation, not just in your lungs, but throughout your body. Inflammation can contribute to atherosclerosis, blood clots, and risk of heart attack. Smoking makes all heart health indicators worse. If you have high cholesterol, high triglycerides, or high blood pressure, smoking magnifies the danger.  --> Become more physically active.  Even moderate exercise can help improve cholesterol, triglycerides, and blood pressure. Aerobic exercise seems to be able to stop the sharp rise of triglycerides after eating, perhaps because of a decrease in the  amount of triglyceride released by the liver, or because active muscle clears triglycerides out of the blood stream more quickly than inactive muscle. If you havent exercised regularly (or at all) for years, I recommend starting slowly, by walking at an easy pace for 15 minutes a day. Then, as you feel more comfortable, increase the amount. Your ultimate goal should be at least 30 minutes of moderate physical activity, at least five days a week.  Risk factors for prediabetes and type 2 diabetes  Researchers don't fully understand why some people develop prediabetes and type 2 diabetes and others don't.  It's clear that certain factors increase the risk, however, including:  Weight. The more fatty tissue you have, the more resistant your cells become to insulin.  Inactivity. The less active you are, the greater your risk. Physical activity helps you control your weight, uses up glucose as energy and makes your cells more sensitive to insulin.  Family history. Your risk increases if a parent or sibling has type 2 diabetes.  Race. Although it's unclear why, people of certain races -- including blacks, Hispanics, American Indians and Asian-Americans -- are at higher risk.  Age. Your risk increases as you get older. This may be because you tend to exercise less, lose muscle mass and gain weight as you age. But type 2 diabetes is also increasing dramatically among children, adolescents and younger adults.  Gestational diabetes. If you developed gestational diabetes when you were pregnant, your risk of developing prediabetes and type 2 diabetes later increases. If you gave birth to a baby weighing more than 9 pounds (4 kilograms), you're also at risk of type 2 diabetes.  Polycystic ovary syndrome. For women, having polycystic ovary syndrome -- a common condition characterized by irregular menstrual periods, excess hair growth and obesity -- increases the risk of diabetes.  High blood pressure. Having blood  pressure over 140/90 millimeters of mercury (mm Hg) is linked to an increased risk of type 2 diabetes.  Abnormal cholesterol and triglyceride levels. If you have low levels of high-density lipoprotein (HDL), or "good," cholesterol, your risk of type 2 diabetes is higher. Triglycerides are another type of fat carried in the blood. People with high levels of triglycerides have an increased risk of type 2 diabetes. Your doctor can let you know what your cholesterol and triglyceride levels are.  A good guide to good carbs: The glycemic index ---If you have diabetes, or at risk for diabetes, you know all too well that when you  eat carbohydrates, your blood sugar goes up. The total amount of carbs you consume at a meal or in a snack mostly determines what your blood sugar will do. But the food itself also plays a role. A serving of white rice has almost the same effect as eating pure table sugar -- a quick, high spike in blood sugar. A serving of lentils has a slower, smaller effect.  ---Picking good sources of carbs can help you control your blood sugar and your weight. Even if you don't have diabetes, eating healthier carbohydrate-rich foods can help ward off a host of chronic conditions, from heart disease to various cancers to, well, diabetes.  ---One way to choose foods is with the glycemic index (GI). This tool measures how much a food boosts blood sugar.  The glycemic index rates the effect of a specific amount of a food on blood sugar compared with the same amount of pure glucose. A food with a glycemic index of 28 boosts blood sugar only 28% as much as pure glucose. One with a GI of 95 acts like pure glucose.    High glycemic foods result in a quick spike in insulin and blood sugar (also known as blood glucose).  Low glycemic foods have a slower, smaller effect- these are healthier for you.   Using the glycemic index Using the glycemic index is easy: choose foods in the low GI category instead of  those in the high GI category (see below), and go easy on those in between. Low glycemic index (GI of 55 or less): Most fruits and vegetables, beans, minimally processed grains, pasta, low-fat dairy foods, and nuts.  Moderate glycemic index (GI 56 to 69): White and sweet potatoes, corn, white rice, couscous, breakfast cereals such as Cream of Wheat and Mini Wheats.  High glycemic index (GI of 70 or higher): White bread, rice cakes, most crackers, bagels, cakes, doughnuts, croissants, most packaged breakfast cereals. You can see the values for 100 commons foods and get links to more at www.health.[employer]/glycemic.  Swaps for lowering glycemic index  Instead of this high-glycemic index food Eat this lower-glycemic index food  White rice Brown rice or converted rice  Instant oatmeal Steel-cut oats  Cornflakes Bran flakes  Baked potato Pasta, bulgur  White bread Whole-grain bread  Corn Peas or leafy greens       Prediabetes Eating Plan  Prediabetes--also called impaired glucose tolerance or impaired fasting glucose--is a condition that causes blood sugar (blood glucose) levels to be higher than normal. Following a healthy diet can help to keep prediabetes under control. It can also help to lower the risk of type 2 diabetes and heart disease, which are increased in people who have prediabetes. Along with regular exercise, a healthy diet:  Promotes weight loss.  Helps to control blood sugar levels.  Helps to improve the way that the body uses insulin.   WHAT DO I NEED TO KNOW ABOUT THIS EATING PLAN?   Use the glycemic index (GI) to plan your meals. The index tells you how quickly a food will raise your blood sugar. Choose low-GI foods. These foods take a longer time to raise blood sugar.  Pay close attention to the amount of carbohydrates in the food that you eat. Carbohydrates increase blood sugar levels.  Keep track of how many calories you take in. Eating the right amount of  calories will help you to achieve a healthy weight. Losing about 7 percent of your starting weight can help to prevent  type 2 diabetes.  You may want to follow a Mediterranean diet. This diet includes a lot of vegetables, lean meats or fish, whole grains, fruits, and healthy oils and fats.   WHAT FOODS CAN I EAT?  Grains Whole grains, such as whole-wheat or whole-grain breads, crackers, cereals, and pasta. Unsweetened oatmeal. Bulgur. Barley. Quinoa. Brown rice. Corn or whole-wheat flour tortillas or taco shells. Vegetables Lettuce. Spinach. Peas. Beets. Cauliflower. Cabbage. Broccoli. Carrots. Tomatoes. Squash. Eggplant. Herbs. Peppers. Onions. Cucumbers. Brussels sprouts. Fruits Berries. Bananas. Apples. Oranges. Grapes. Papaya. Mango. Pomegranate. Kiwi. Grapefruit. Cherries. Meats and Other Protein Sources Seafood. Lean meats, such as chicken and [place] or lean cuts of pork and beef. Tofu. Eggs. Nuts. Beans. Dairy Low-fat or fat-free dairy products, such as yogurt, cottage cheese, and cheese. Beverages Water. Tea. Coffee. Sugar-free or diet soda. Seltzer water. Milk. Milk alternatives, such as soy or almond milk. Condiments Mustard. Relish. Low-fat, low-sugar ketchup. Low-fat, low-sugar barbecue sauce. Low-fat or fat-free mayonnaise. Sweets and Desserts Sugar-free or low-fat pudding. Sugar-free or low-fat ice cream and other frozen treats. Fats and Oils Avocado. Walnuts. Olive oil. The items listed above may not be a complete list of recommended foods or beverages. Contact your dietitian for more options.    WHAT FOODS ARE NOT RECOMMENDED?  Grains Refined white flour and flour products, such as bread, pasta, snack foods, and cereals. Beverages Sweetened drinks, such as sweet iced tea and soda. Sweets and Desserts Baked goods, such as cake, cupcakes, pastries, cookies, and cheesecake. The items listed above may not be a complete list of foods and beverages to avoid. Contact your  dietitian for more information.   This information is not intended to replace advice given to you by your health care provider. Make sure you discuss any questions you have with your health care provider.   Document Released: 10/27/2014 Document Reviewed: 10/27/2014 Elsevier Interactive Patient Education [employer]

## 2018-01-10 ENCOUNTER — Other Ambulatory Visit: Payer: Self-pay

## 2018-01-10 ENCOUNTER — Telehealth: Payer: Self-pay | Admitting: Family Medicine

## 2018-01-10 DIAGNOSIS — F332 Major depressive disorder, recurrent severe without psychotic features: Secondary | ICD-10-CM

## 2018-01-10 MED ORDER — VENLAFAXINE HCL ER 225 MG PO TB24: 1.0000 | ORAL_TABLET | Freq: Every day | ORAL | 0 refills | Status: DC

## 2018-01-10 NOTE — Telephone Encounter (Signed)
Refill request Venlafaxine. MPulliam, CMA/RT(R)

## 2018-01-10 NOTE — Telephone Encounter (Signed)
error 

## 2018-02-18 ENCOUNTER — Telehealth: Payer: Self-pay | Admitting: Family Medicine

## 2018-02-18 ENCOUNTER — Telehealth: Payer: Self-pay

## 2018-02-18 DIAGNOSIS — F332 Major depressive disorder, recurrent severe without psychotic features: Secondary | ICD-10-CM

## 2018-02-18 MED ORDER — VENLAFAXINE HCL ER 225 MG PO TB24: 1.0000 | ORAL_TABLET | Freq: Every day | ORAL | 0 refills | Status: DC

## 2018-02-18 MED ORDER — BUSPIRONE HCL 15 MG PO TABS: 15.0000 mg | ORAL_TABLET | Freq: Three times a day (TID) | ORAL | 5 refills | Status: DC

## 2018-02-18 NOTE — Telephone Encounter (Signed)
Patient called requesting refill on buspirone and venlafaxine; chart reviewed and refills on these medications sent per office policy.  Patient also request refill on fenofibrate which is on patient's medication.  Reviewed chart and medication was discontinued in chart on 08/15/2017 by Dr. Sharee Holsterpalski.   Please review and advise if patient should still be taking the fenofibrate.

## 2018-02-18 NOTE — Telephone Encounter (Signed)
According to his medication list each time I saw him and review of any meds I ever prescribed to patient, I do not see that he was ever on that med while seeing us.  So, no do not RF this med.    Please make sure pt is following up late Sept for repeat labs and OV as per last OV note. Thanks

## 2018-02-18 NOTE — Telephone Encounter (Signed)
Sent in refills please see note in chart. MPulliam, CMA/RT(R)

## 2018-02-18 NOTE — Telephone Encounter (Signed)
Patient  Is completely out  & called requested a rush order on Rx refills on these (3) meds:  1)---busPIRone (BUSPAR) 15 MG tablet [[id number]][[id number]]   Order Details  Dose: 15 mg Route: Oral Frequency: 3 times daily  Indications of Use: Anxiety Disorder  Dispense Quantity: 90 tablet Refills: 5 Fills remaining: --        Sig: Take 1 tablet (15 mg total) by mouth 3 (three) times daily.     2)-- Venlafaxine HCl 225 MG TB24 [[id number]][[id number]]   Order Details  Dose: 1 tablet Route: Oral Frequency: Daily  Dispense Quantity: 30 each Refills: 0 Fills remaining: --        Sig: Take 1 tablet (225 mg total) by mouth daily.     3)---- Pt also states need refill on Effexor XR but I do not see an Rx for that med.  Please refill authorization to:   [facility]      [facility] [address] - [place] ([address]), [place] - [address] [phone] (Phone) [phone] (Fax)   ---Forwarding request to medical assistant. --Fausto Skillernglh

## 2018-02-20 NOTE — Telephone Encounter (Signed)
Spoke to the patient, he states that he was on the fenofibrate when he started seeing you as his PCP and that medication was changed to Nicain in Feb 20019 but he was unable to take the Nicain.  The Nicain caused pin and needle sensation in his arms and legs.  Patient went back to taking the fenofibrate.  Patient states that before that he was on gemfibrozil and feels that worked better for lower his triglycerides.  Please advise. MPulliam, CMA/RT(R)

## 2018-02-20 NOTE — Telephone Encounter (Signed)
Ok to RF fenofibrate.  Once on the med consistently for 3+ months- will need LFT's and FLP

## 2018-02-21 ENCOUNTER — Other Ambulatory Visit: Payer: Self-pay

## 2018-02-21 DIAGNOSIS — E781 Pure hyperglyceridemia: Secondary | ICD-10-CM

## 2018-02-21 MED ORDER — FENOFIBRATE 160 MG PO TABS: 160.0000 mg | ORAL_TABLET | Freq: Every day | ORAL | 0 refills | Status: DC

## 2018-02-21 NOTE — Progress Notes (Signed)
Per Dr. Synthia Innocentpalski's note sent in refill for 90 days. MPulliam, CMA/RT(R)

## 2018-02-21 NOTE — Telephone Encounter (Signed)
Refill sent, patient notified. MPulliam, CMA/RT(R)

## 2018-03-18 ENCOUNTER — Telehealth: Payer: Self-pay | Admitting: Family Medicine

## 2018-03-18 ENCOUNTER — Other Ambulatory Visit: Payer: Self-pay

## 2018-03-18 DIAGNOSIS — F332 Major depressive disorder, recurrent severe without psychotic features: Secondary | ICD-10-CM

## 2018-03-18 MED ORDER — VENLAFAXINE HCL ER 225 MG PO TB24: 1.0000 | ORAL_TABLET | Freq: Every day | ORAL | 0 refills | Status: DC

## 2018-03-18 NOTE — Telephone Encounter (Signed)
Patient is requesting a refill of his venlafaxine, [facility] [address]

## 2018-03-18 NOTE — Telephone Encounter (Signed)
Patient requesting a refill on venlafaxine. Refill sent in per office policy after review of chart.  Patient notified. MPulliam, CMA/RT(R)

## 2018-03-18 NOTE — Telephone Encounter (Signed)
Refill sent in and patient notified. MPulliam, CMA/RT(R)  

## 2018-03-20 ENCOUNTER — Other Ambulatory Visit: Payer: Self-pay

## 2018-03-22 ENCOUNTER — Ambulatory Visit: Payer: Self-pay | Admitting: Family Medicine

## 2018-03-26 ENCOUNTER — Ambulatory Visit (INDEPENDENT_AMBULATORY_CARE_PROVIDER_SITE_OTHER): Payer: BLUE CROSS/BLUE SHIELD | Admitting: Psychiatry

## 2018-03-26 ENCOUNTER — Encounter (HOSPITAL_COMMUNITY): Payer: Self-pay | Admitting: Psychiatry

## 2018-03-26 ENCOUNTER — Encounter

## 2018-03-26 VITALS — BP 121/81 | HR 114 | Ht 71.0 in | Wt 247.8 lb

## 2018-03-26 DIAGNOSIS — F4310 Post-traumatic stress disorder, unspecified: Secondary | ICD-10-CM | POA: Diagnosis not present

## 2018-03-26 DIAGNOSIS — Z87891 Personal history of nicotine dependence: Secondary | ICD-10-CM

## 2018-03-26 DIAGNOSIS — F332 Major depressive disorder, recurrent severe without psychotic features: Secondary | ICD-10-CM | POA: Diagnosis not present

## 2018-03-26 DIAGNOSIS — Z62810 Personal history of physical and sexual abuse in childhood: Secondary | ICD-10-CM | POA: Diagnosis not present

## 2018-03-26 DIAGNOSIS — Z62811 Personal history of psychological abuse in childhood: Secondary | ICD-10-CM

## 2018-03-26 DIAGNOSIS — G4700 Insomnia, unspecified: Secondary | ICD-10-CM

## 2018-03-26 MED ORDER — BUSPIRONE HCL 10 MG PO TABS: 20.0000 mg | ORAL_TABLET | Freq: Three times a day (TID) | ORAL | 0 refills | Status: DC

## 2018-03-26 MED ORDER — VENLAFAXINE HCL ER 150 MG PO CP24: 150.0000 mg | ORAL_CAPSULE | Freq: Every day | ORAL | 0 refills | Status: DC

## 2018-03-26 MED ORDER — LAMOTRIGINE 25 MG PO TABS: ORAL_TABLET | ORAL | 0 refills | Status: DC
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
## 2018-04-01 DIAGNOSIS — R1030 Lower abdominal pain, unspecified: Secondary | ICD-10-CM | POA: Insufficient documentation

## 2018-04-01 DIAGNOSIS — R102 Pelvic and perineal pain: Secondary | ICD-10-CM | POA: Diagnosis not present

## 2018-04-01 DIAGNOSIS — M79673 Pain in unspecified foot: Secondary | ICD-10-CM | POA: Insufficient documentation

## 2018-04-01 DIAGNOSIS — M25552 Pain in left hip: Secondary | ICD-10-CM | POA: Diagnosis not present

## 2018-04-01 DIAGNOSIS — M79672 Pain in left foot: Secondary | ICD-10-CM | POA: Diagnosis not present

## 2018-04-02 DIAGNOSIS — M25512 Pain in left shoulder: Secondary | ICD-10-CM | POA: Diagnosis not present

## 2018-04-04 DIAGNOSIS — M25512 Pain in left shoulder: Secondary | ICD-10-CM | POA: Diagnosis not present

## 2018-04-09 DIAGNOSIS — M25552 Pain in left hip: Secondary | ICD-10-CM | POA: Insufficient documentation

## 2018-04-15 DIAGNOSIS — Z9889 Other specified postprocedural states: Secondary | ICD-10-CM | POA: Insufficient documentation

## 2018-04-16 DIAGNOSIS — M25552 Pain in left hip: Secondary | ICD-10-CM | POA: Diagnosis not present

## 2018-04-17 ENCOUNTER — Other Ambulatory Visit (INDEPENDENT_AMBULATORY_CARE_PROVIDER_SITE_OTHER): Payer: BLUE CROSS/BLUE SHIELD

## 2018-04-17 DIAGNOSIS — R7303 Prediabetes: Secondary | ICD-10-CM

## 2018-04-17 DIAGNOSIS — E781 Pure hyperglyceridemia: Secondary | ICD-10-CM | POA: Diagnosis not present

## 2018-04-18 LAB — LIPID PANEL
CHOLESTEROL TOTAL: 217 mg/dL — AB (ref 100–199)
Chol/HDL Ratio: 7.8 ratio — ABNORMAL HIGH (ref 0.0–5.0)
HDL: 28 mg/dL — AB (ref >39)
Triglycerides: 467 mg/dL — ABNORMAL HIGH (ref 0–149)

## 2018-04-18 LAB — HEMOGLOBIN A1C
Est. average glucose Bld gHb Est-mCnc: 140 mg/dL
Hgb A1c MFr Bld: 6.5 % — ABNORMAL HIGH (ref 4.8–5.6)

## 2018-04-18 LAB — LDL CHOLESTEROL, DIRECT: LDL Direct: 132 mg/dL — ABNORMAL HIGH (ref 0–99)

## 2018-04-19 ENCOUNTER — Ambulatory Visit (INDEPENDENT_AMBULATORY_CARE_PROVIDER_SITE_OTHER): Payer: BLUE CROSS/BLUE SHIELD | Admitting: Psychiatry

## 2018-04-19 ENCOUNTER — Encounter (HOSPITAL_COMMUNITY): Payer: Self-pay | Admitting: Psychiatry

## 2018-04-19 DIAGNOSIS — F4310 Post-traumatic stress disorder, unspecified: Secondary | ICD-10-CM | POA: Diagnosis not present

## 2018-04-19 DIAGNOSIS — F332 Major depressive disorder, recurrent severe without psychotic features: Secondary | ICD-10-CM

## 2018-04-19 MED ORDER — BUSPIRONE HCL 10 MG PO TABS: 10.0000 mg | ORAL_TABLET | Freq: Three times a day (TID) | ORAL | 0 refills | Status: DC

## 2018-04-19 MED ORDER — LAMOTRIGINE 100 MG PO TABS: ORAL_TABLET | ORAL | 1 refills | Status: DC

## 2018-04-19 MED ORDER — FLUOXETINE HCL 10 MG PO CAPS: ORAL_CAPSULE | ORAL | 1 refills | Status: DC

## 2018-04-19 MED ORDER — VENLAFAXINE HCL ER 75 MG PO CP24: 75.0000 mg | ORAL_CAPSULE | Freq: Every day | ORAL | 1 refills | Status: DC

## 2018-04-19 NOTE — Progress Notes (Signed)
BH MD/PA/NP OP Progress Note  04/19/2018 9:51 AM Harold Collins  MRN:  [id number]  Chief Complaint: See some improvement with the new medication but I still have nightmares and flashback.  I do not think BuSpar working.  HPI: Harold Collins is a 37 year old Caucasian married man who was seen first time 4 weeks ago for the management of depression and PTSD symptoms.  Patient had a history of anxiety, irritable mood, anger, poor sleep.  He has multiple stressors, since electrocuted at work due to shoulder pain he is unable to go back to work.  He is going to start physical therapy.  He still has a lot of pain.  Patient also have a lot of marital issues.  He has not seen his 67 year old daughter since May.  Patient lives with his current wife who is very supportive and 82-year-old son.  We started him on Lamictal and he is tolerating well.  He has no tremors, shakes, rash or itching.  However still struggle with nightmares, flashback, insomnia and anxiety.  We have requested gene testing results which has done in the past but he forgot to bring it today.  He is also left a message to his therapist for counseling but he has not heard back.  Since taking the Lamictal he is not agitated or having any severe mood swing.  Since the last visit he has no suicidal thoughts.  He is not drinking or using any illegal substances.  He is more hopeful and denies any crying spells or any negative thoughts.  Visit Diagnosis:    ICD-10-CM   1. PTSD (post-traumatic stress disorder) F43.10 venlafaxine XR (EFFEXOR-XR) 75 MG 24 hr capsule    FLUoxetine (PROZAC) 10 MG capsule  2. Severe episode of recurrent major depressive disorder, without psychotic features (HCC) F33.2 venlafaxine XR (EFFEXOR-XR) 75 MG 24 hr capsule    lamoTRIgine (LAMICTAL) 100 MG tablet    busPIRone (BUSPAR) 10 MG tablet    Past Psychiatric History: Patient had a history of depression and as a teenager he had cut himself and taken overdose but never require  psychiatric inpatient treatment.  He had a history of severe mood swing, anger, fight, depression.  While he was in the [employer] he was sent to jail for 10 days due to assault charges.  In the past he had tried Paxil and then he tried Lexapro, Brintellix, and taking Effexor since 2016.  Past Medical History:  Past Medical History:  Diagnosis Date  . Anxiety   . Barrett's esophagus   . Depression   . GERD (gastroesophageal reflux disease)   . Major depressive disorder   . PTSD (post-traumatic stress disorder)     Past Surgical History:  Procedure Laterality Date  . SHOULDER SURGERY    . WRIST SURGERY      Family Psychiatric History: Reviewed  Family History:  Family History  Problem Relation Age of Onset  . Depression Mother   . Diabetes Mother   . Anxiety disorder Mother   . Physical abuse Mother   . Cancer Father        gsophageal  . Depression Father   . Anxiety disorder Father   . Physical abuse Father   . Depression Sister   . Anxiety disorder Sister   . Physical abuse Sister   . Depression Brother   . ADD / ADHD Brother   . Anxiety disorder Brother     Social History:  Social History   Socioeconomic History  . Marital status: Married  Spouse name: Not on file  . Number of children: 2  . Years of education: Not on file  . Highest education level: High school graduate  Occupational History  . Not on file  Social Needs  . Financial resource strain: Somewhat hard  . Food insecurity:    Worry: Never true    Inability: Never true  . Transportation needs:    Medical: No    Non-medical: No  Tobacco Use  . Smoking status: Former Smoker    Years: 3.00    Last attempt to quit: 05/08/2016    Years since quitting: 1.9  . Smokeless tobacco: Never Used  Substance and Sexual Activity  . Alcohol use: No    Frequency: Never  . Drug use: No  . Sexual activity: Yes  Lifestyle  . Physical activity:    Days per week: 4 days    Minutes per session: 120 min  .  Stress: Very much  Relationships  . Social connections:    Talks on phone: Never    Gets together: Not on file    Attends religious service: Never    Active member of club or organization: No    Attends meetings of clubs or organizations: Never    Relationship status: Married  Other Topics Concern  . Not on file  Social History Narrative  . Not on file    Allergies: No Known Allergies  Metabolic Disorder Labs: Lab Results  Component Value Date   HGBA1C 6.5 (H) 04/17/2018   No results found for: PROLACTIN Lab Results  Component Value Date   CHOL 217 (H) 04/17/2018   TRIG 467 (H) 04/17/2018   HDL 28 (L) 04/17/2018   CHOLHDL 7.8 (H) 04/17/2018   LDLCALC Comment 04/17/2018   LDLCALC Comment 12/12/2017   Lab Results  Component Value Date   TSH 2.970 07/25/2017    Therapeutic Level Labs: No results found for: LITHIUM No results found for: VALPROATE No components found for:  CBMZ  Current Medications: Current Outpatient Medications  Medication Sig Dispense Refill  . busPIRone (BUSPAR) 10 MG tablet Take 2 tablets (20 mg total) by mouth 3 (three) times daily. 180 tablet 0  . Cholecalciferol (VITAMIN D) 2000 units tablet Take 2,000 Units by mouth daily.    . fenofibrate 160 MG tablet Take 1 tablet (160 mg total) by mouth daily. 90 tablet 0  . lamoTRIgine (LAMICTAL) 25 MG tablet Take one tab daily for 1 week and than 2 tab daily 60 tablet 0  . loratadine-pseudoephedrine (CLARITIN-D 24-HOUR) 10-240 MG 24 hr tablet Take 1 tablet by mouth daily.    Marland Kitchen omeprazole (PRILOSEC) 20 MG capsule Take 1 capsule (20 mg total) by mouth daily. 90 capsule 1  . venlafaxine XR (EFFEXOR-XR) 150 MG 24 hr capsule Take 1 capsule (150 mg total) by mouth daily. 30 capsule 0  . Vitamin D, Ergocalciferol, (DRISDOL) 50000 units CAPS capsule Take 1 capsule (50,000 Units total) by mouth every 7 (seven) days. 12 capsule 10   No current facility-administered medications for this visit.       Musculoskeletal: Strength & Muscle Tone: within normal limits Gait & Station: normal Patient leans: N/A  Psychiatric Specialty Exam: Review of Systems  Constitutional: Negative.   Musculoskeletal: Positive for joint pain.  Skin: Negative.  Negative for itching and rash.  Neurological: Positive for headaches. Negative for tremors.  Psychiatric/Behavioral: The patient is nervous/anxious.     Blood pressure 128/74, pulse 72, height 5\' 11"  (1.803 m), weight 250 lb (  113.4 kg).Body mass index is 34.87 kg/m.  General Appearance: Fairly Groomed  Eye Contact:  Fair  Speech:  Clear and Coherent  Volume:  Normal  Mood:  Anxious  Affect:  Congruent  Thought Process:  Goal Directed  Orientation:  Full (Time, Place, and Person)  Thought Content: Rumination   Suicidal Thoughts:  No  Homicidal Thoughts:  No  Memory:  Immediate;   Good Recent;   Good Remote;   Good  Judgement:  Good  Insight:  Good  Psychomotor Activity:  Normal  Concentration:  Concentration: Good and Attention Span: Good  Recall:  Good  Fund of Knowledge: Good  Language: Good  Akathisia:  No  Handed:  Right  AIMS (if indicated): not done  Assets:  Communication Skills Desire for Improvement Housing  ADL's:  Intact  Cognition: WNL  Sleep:  Fair   Screenings: PHQ2-9     Office Visit from 12/19/2017 in [facility] Visit from 08/15/2017 in [facility] Visit from 07/25/2017 in [facility] Total Score  5  4  5   PHQ-9 Total Score  19  18  19        Assessment and Plan: Major depressive disorder, recurrent.  Posttraumatic stress disorder.  Patient seen marginal improvement with the Lamictal.  I recommended to try Lamictal 100 mg daily and reminded him about medication side effect especially rash and in that case he need to stop the medication immediately.  I will also cut down his Effexor since it is not  working and BuSpar for the same reason.  We will try Prozac and he remember Prozac did better on his gene testing results.  We will start Prozac 10 mg daily for 1 week and then 20 mg daily.  We will reduce Effexor to 75 mg daily, reduce BuSpar to 10 mg 3 times a day.  Encouraged to keep trying to get appointment to see his therapist.  Recommended to call us back if is any question, concern if he feels worsening of the symptoms.  Follow-up in 6 weeks. Time spent 30 minutes.  Most of the 50% of the time spent in psychoeducation, counseling, coronation of care, long-term prognosis and medication side effects.  Patient was offered to ask the question and all the questions were answered and patient appears to be satisfied.   Cleotis Nipper, MD 04/19/2018, 9:51 AM

## 2018-04-24 ENCOUNTER — Encounter: Payer: Self-pay | Admitting: Family Medicine

## 2018-04-24 ENCOUNTER — Ambulatory Visit (INDEPENDENT_AMBULATORY_CARE_PROVIDER_SITE_OTHER): Payer: BLUE CROSS/BLUE SHIELD | Admitting: Family Medicine

## 2018-04-24 VITALS — BP 128/84 | HR 97 | Resp 16 | Wt 247.0 lb

## 2018-04-24 DIAGNOSIS — E669 Obesity, unspecified: Secondary | ICD-10-CM

## 2018-04-24 DIAGNOSIS — F411 Generalized anxiety disorder: Secondary | ICD-10-CM

## 2018-04-24 DIAGNOSIS — E781 Pure hyperglyceridemia: Secondary | ICD-10-CM

## 2018-04-24 DIAGNOSIS — E786 Lipoprotein deficiency: Secondary | ICD-10-CM | POA: Diagnosis not present

## 2018-04-24 DIAGNOSIS — E1169 Type 2 diabetes mellitus with other specified complication: Secondary | ICD-10-CM | POA: Diagnosis not present

## 2018-04-24 DIAGNOSIS — E0822 Diabetes mellitus due to underlying condition with diabetic chronic kidney disease: Secondary | ICD-10-CM | POA: Diagnosis not present

## 2018-04-24 DIAGNOSIS — E66811 Obesity, class 1: Secondary | ICD-10-CM

## 2018-04-24 DIAGNOSIS — E782 Mixed hyperlipidemia: Secondary | ICD-10-CM

## 2018-04-24 DIAGNOSIS — Z23 Encounter for immunization: Secondary | ICD-10-CM

## 2018-04-24 DIAGNOSIS — N183 Chronic kidney disease, stage 3 unspecified: Secondary | ICD-10-CM | POA: Insufficient documentation

## 2018-04-24 MED ORDER — METFORMIN HCL 500 MG PO TABS: 500.0000 mg | ORAL_TABLET | Freq: Two times a day (BID) | ORAL | 3 refills | Status: DC

## 2018-04-24 MED ORDER — OMEGA-3-ACID ETHYL ESTERS 1 G PO CAPS: 2.0000 g | ORAL_CAPSULE | Freq: Two times a day (BID) | ORAL | 3 refills | Status: DC

## 2018-04-24 NOTE — Patient Instructions (Signed)
Start metformin, half tablet every 12 hours -if tolerated after 6 days, begin taking 500mg  every 12 hours -We will re-check your A1C in 3 months to see if it has improved with a goal below 6.0
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
## 2018-05-06 DIAGNOSIS — M25512 Pain in left shoulder: Secondary | ICD-10-CM | POA: Diagnosis not present

## 2018-05-09 DIAGNOSIS — F4310 Post-traumatic stress disorder, unspecified: Secondary | ICD-10-CM | POA: Diagnosis not present

## 2018-05-09 DIAGNOSIS — M25512 Pain in left shoulder: Secondary | ICD-10-CM | POA: Diagnosis not present

## 2018-05-13 DIAGNOSIS — M25512 Pain in left shoulder: Secondary | ICD-10-CM | POA: Diagnosis not present

## 2018-05-16 DIAGNOSIS — F4310 Post-traumatic stress disorder, unspecified: Secondary | ICD-10-CM | POA: Diagnosis not present

## 2018-05-31 ENCOUNTER — Encounter (HOSPITAL_COMMUNITY): Payer: Self-pay | Admitting: Psychiatry

## 2018-05-31 ENCOUNTER — Ambulatory Visit (INDEPENDENT_AMBULATORY_CARE_PROVIDER_SITE_OTHER): Payer: BLUE CROSS/BLUE SHIELD | Admitting: Psychiatry

## 2018-05-31 DIAGNOSIS — F4310 Post-traumatic stress disorder, unspecified: Secondary | ICD-10-CM | POA: Diagnosis not present

## 2018-05-31 DIAGNOSIS — F332 Major depressive disorder, recurrent severe without psychotic features: Secondary | ICD-10-CM | POA: Diagnosis not present

## 2018-05-31 MED ORDER — LAMOTRIGINE 150 MG PO TABS: ORAL_TABLET | ORAL | 1 refills | Status: DC

## 2018-05-31 MED ORDER — FLUOXETINE HCL 10 MG PO CAPS: ORAL_CAPSULE | ORAL | 1 refills | Status: DC

## 2018-05-31 MED ORDER — VENLAFAXINE HCL ER 37.5 MG PO CP24: ORAL_CAPSULE | ORAL | 0 refills | Status: DC
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
## 2018-06-11 ENCOUNTER — Other Ambulatory Visit: Payer: Self-pay | Admitting: Family Medicine

## 2018-06-11 ENCOUNTER — Other Ambulatory Visit (HOSPITAL_COMMUNITY): Payer: Self-pay

## 2018-06-11 DIAGNOSIS — F4310 Post-traumatic stress disorder, unspecified: Secondary | ICD-10-CM

## 2018-06-11 DIAGNOSIS — K2270 Barrett's esophagus without dysplasia: Secondary | ICD-10-CM

## 2018-06-11 DIAGNOSIS — F332 Major depressive disorder, recurrent severe without psychotic features: Secondary | ICD-10-CM

## 2018-06-11 MED ORDER — FLUOXETINE HCL 10 MG PO CAPS: ORAL_CAPSULE | ORAL | 1 refills | Status: DC

## 2018-06-11 MED ORDER — LAMOTRIGINE 150 MG PO TABS: ORAL_TABLET | ORAL | 1 refills | Status: DC

## 2018-06-11 MED ORDER — VENLAFAXINE HCL ER 37.5 MG PO CP24: ORAL_CAPSULE | ORAL | 0 refills | Status: DC

## 2018-06-11 MED ORDER — OMEPRAZOLE 20 MG PO CPDR: 20.0000 mg | DELAYED_RELEASE_CAPSULE | Freq: Every day | ORAL | 1 refills | Status: DC

## 2018-06-11 NOTE — Telephone Encounter (Signed)
Reviewed chart and sent in refill request per office policy.  Patient notified. MPulliam, CMA/RT(R)

## 2018-06-11 NOTE — Telephone Encounter (Signed)
Pt cld while office at lunch request provider approve  Rx refill for :  omeprazole (PRILOSEC) 20 MG capsule [[id number]][[id number]]   Order Details  Dose: 20 mg Route: Oral Frequency: Daily  Dispense Quantity: 90 capsule Refills: 1 Fills remaining: --        Sig: Take 1 capsule (20 mg total) by mouth daily.          ---Forwarding request to medical assistant that if provider approved pls send refill order to:     [facility] [address] - [place] ([address]),  - [address] [phone] (Phone) [phone] (Fax)   --glh

## 2018-06-26 HISTORY — PX: MENISCECTOMY: SHX123

## 2018-07-11 DIAGNOSIS — F4310 Post-traumatic stress disorder, unspecified: Secondary | ICD-10-CM | POA: Diagnosis not present

## 2018-07-26 DIAGNOSIS — M25552 Pain in left hip: Secondary | ICD-10-CM | POA: Diagnosis not present

## 2018-07-31 DIAGNOSIS — F4310 Post-traumatic stress disorder, unspecified: Secondary | ICD-10-CM | POA: Diagnosis not present

## 2018-08-02 ENCOUNTER — Ambulatory Visit (HOSPITAL_COMMUNITY): Payer: BLUE CROSS/BLUE SHIELD | Admitting: Psychiatry

## 2018-08-15 DIAGNOSIS — F4310 Post-traumatic stress disorder, unspecified: Secondary | ICD-10-CM | POA: Diagnosis not present

## 2018-08-22 ENCOUNTER — Other Ambulatory Visit (HOSPITAL_COMMUNITY): Payer: Self-pay

## 2018-08-22 DIAGNOSIS — F4310 Post-traumatic stress disorder, unspecified: Secondary | ICD-10-CM | POA: Diagnosis not present

## 2018-08-22 DIAGNOSIS — F332 Major depressive disorder, recurrent severe without psychotic features: Secondary | ICD-10-CM

## 2018-08-22 MED ORDER — LAMOTRIGINE 150 MG PO TABS: ORAL_TABLET | ORAL | 0 refills | Status: DC

## 2018-08-22 MED ORDER — VENLAFAXINE HCL ER 37.5 MG PO CP24: ORAL_CAPSULE | ORAL | 0 refills | Status: DC

## 2018-08-22 MED ORDER — FLUOXETINE HCL 10 MG PO CAPS: ORAL_CAPSULE | ORAL | 0 refills | Status: DC

## 2018-08-23 ENCOUNTER — Encounter: Payer: Self-pay | Admitting: Family Medicine

## 2018-08-23 ENCOUNTER — Ambulatory Visit: Payer: BLUE CROSS/BLUE SHIELD | Admitting: Family Medicine

## 2018-08-23 VITALS — BP 118/76 | HR 94 | Temp 98.2°F | Ht 71.0 in | Wt 240.0 lb

## 2018-08-23 DIAGNOSIS — E0822 Diabetes mellitus due to underlying condition with diabetic chronic kidney disease: Secondary | ICD-10-CM | POA: Diagnosis not present

## 2018-08-23 DIAGNOSIS — N183 Chronic kidney disease, stage 3 unspecified: Secondary | ICD-10-CM

## 2018-08-23 DIAGNOSIS — Z833 Family history of diabetes mellitus: Secondary | ICD-10-CM

## 2018-08-23 DIAGNOSIS — E781 Pure hyperglyceridemia: Secondary | ICD-10-CM | POA: Diagnosis not present

## 2018-08-23 DIAGNOSIS — K22719 Barrett's esophagus with dysplasia, unspecified: Secondary | ICD-10-CM

## 2018-08-23 DIAGNOSIS — N289 Disorder of kidney and ureter, unspecified: Secondary | ICD-10-CM | POA: Diagnosis not present

## 2018-08-23 DIAGNOSIS — Z800 Family history of malignant neoplasm of digestive organs: Secondary | ICD-10-CM

## 2018-08-23 MED ORDER — OMEPRAZOLE 40 MG PO CPDR: 40.0000 mg | DELAYED_RELEASE_CAPSULE | Freq: Every day | ORAL | 3 refills | Status: DC

## 2018-08-23 MED ORDER — BLOOD GLUCOSE METER KIT: PACK | 0 refills | Status: DC

## 2018-08-23 NOTE — Patient Instructions (Addendum)
Please remember you can [email or web address] to see about a glucometer and test strips.   Melissa my assistant will send one in for you but it might just be cheaper for you to buy the one at [facility] nonetheless.  Please check your fasting blood sugars which is first thing in the morning after 8 to 10-hour fast as well as 2 hours after largest meal.  Please look at the information below for further directions and education about this      Diabetes Mellitus and Standards of Medical Care  Managing diabetes (diabetes mellitus) can be complicated. Your diabetes treatment may be managed by a team of health care providers, including:  A diet and nutrition specialist (registered dietitian).  A nurse.  A certified diabetes educator (CDE).  A diabetes specialist (endocrinologist).  An eye doctor.  A primary care provider.  A dentist.  Your health care providers follow a schedule in order to help you get the best quality of care. The following schedule is a general guideline for your diabetes management plan. Your health care providers may also give you more specific instructions.  HbA1c (hemoglobin A1c) test This test provides information about blood sugar (glucose) control over the previous 2-3 months. It is used to check whether your diabetes management plan needs to be adjusted.  If you are meeting your treatment goals, this test is done at least 2 times a year.  If you are not meeting treatment goals or if your treatment goals have changed, this test is done 4 times a year.  Blood pressure test  This test is done at every routine medical visit. For most people, the goal is less than 130/80. Ask your health care provider what your goal blood pressure should be.  Dental and eye exams  Visit your dentist two times a year.  If you have type 1 diabetes, get an eye exam 3-5 years after you are diagnosed, and then once a year after your first exam. ? If you were diagnosed with type 1  diabetes as a child, get an eye exam when you are age 6 or older and have had diabetes for 3-5 years. After the first exam, you should get an eye exam once a year.  If you have type 2 diabetes, have an eye exam as soon as you are diagnosed, and then once a year after your first exam.  Foot care exam  Visual foot exams are done at every routine medical visit. The exams check for cuts, bruises, redness, blisters, sores, or other problems with the feet.  A complete foot exam is done by your health care provider once a year. This exam includes an inspection of the structure and skin of your feet, and a check of the pulses and sensation in your feet. ? Type 1 diabetes: Get your first exam 3-5 years after diagnosis. ? Type 2 diabetes: Get your first exam as soon as you are diagnosed.  Check your feet every day for cuts, bruises, redness, blisters, or sores. If you have any of these or other problems that are not healing, contact your health care provider.  Kidney function test (urine microalbumin)  This test is done once a year. ? Type 1 diabetes: Get your first test 5 years after diagnosis. ? Type 2 diabetes: Get your first test as soon as you are diagnosed._  If you have chronic kidney disease (CKD), get a serum creatinine and estimated glomerular filtration rate (eGFR) test once a year.  Lipid profile (cholesterol,  HDL, LDL, triglycerides)  This test should be done when you are diagnosed with diabetes, and every 5 years after the first test. If you are on medicines to lower your cholesterol, you may need to get this test done every year. ? The goal for LDL is less than 100 mg/dL (5.5 mmol/L). If you are at high risk, the goal is less than 70 mg/dL (3.9 mmol/L). ? The goal for HDL is 40 mg/dL (2.2 mmol/L) for men and 50 mg/dL(2.8 mmol/L) for women. An HDL cholesterol of 60 mg/dL (3.3 mmol/L) or higher gives some protection against heart disease. ? The goal for triglycerides is less than 150  mg/dL (8.3 mmol/L).  Immunizations  The yearly flu (influenza) vaccine is recommended for everyone 6 months or older who has diabetes.  The pneumonia (pneumococcal) vaccine is recommended for everyone 2 years or older who has diabetes. If you are 62 or older, you may get the pneumonia vaccine as a series of two separate shots.  The hepatitis B vaccine is recommended for adults shortly after they have been diagnosed with diabetes.  The Tdap (tetanus, diphtheria, and pertussis) vaccine should be given: ? According to normal childhood vaccination schedules, for children. ? Every 10 years, for adults who have diabetes.  The shingles vaccine is recommended for people who have had chicken pox and are 50 years or older.  Mental and emotional health  Screening for symptoms of eating disorders, anxiety, and depression is recommended at the time of diagnosis and afterward as needed. If your screening shows that you have symptoms (you have a positive screening result), you may need further evaluation and be referred to a mental health care provider.  Diabetes self-management education  Education about how to manage your diabetes is recommended at diagnosis and ongoing as needed.  Treatment plan  Your treatment plan will be reviewed at every medical visit.  Summary  Managing diabetes (diabetes mellitus) can be complicated. Your diabetes treatment may be managed by a team of health care providers.  Your health care providers follow a schedule in order to help you get the best quality of care.  Standards of care including having regular physical exams, blood tests, blood pressure monitoring, immunizations, screening tests, and education about how to manage your diabetes.  Your health care providers may also give you more specific instructions based on your individual health.      Type 2 Diabetes Mellitus, Self Care, Adult Caring for yourself after you have been diagnosed with type 2  diabetes (type 2 diabetes mellitus) means keeping your blood sugar (glucose) under control with a balance of:  Nutrition.  Exercise.  Lifestyle changes.  Medicines or insulin, if necessary.  Support from your team of health care providers and others.  The following information explains what you need to know to manage your diabetes at home. What do I need to do to manage my blood glucose?  Check your blood glucose every day, as often as told by your health care provider.  Contact your health care provider if your blood glucose is above your target for 2 tests in a row.  Have your A1c (hemoglobin A1c) level checked at least two times a year, or as often as told by your health care provider. Your health care provider will set individualized treatment goals for you. Generally, the goal of treatment is to maintain the following blood glucose levels:  Before meals (preprandial): 80-130 mg/dL (4.4-7.2 mmol/L).  After meals (postprandial): below 180 mg/dL (10 mmol/L).  A1c level: less than 7%.  What do I need to know about hyperglycemia and hypoglycemia? What is hyperglycemia? Hyperglycemia, also called high blood glucose, occurs when blood glucose is too high.Make sure you know the early signs of hyperglycemia, such as:  Increased thirst.  Hunger.  Feeling very tired.  Needing to urinate more often than usual.  Blurry vision.  What is hypoglycemia? Hypoglycemia, also called low blood glucose, occurswith a blood glucose level at or below 70 mg/dL (3.9 mmol/L). The risk for hypoglycemia increases during or after exercise, during sleep, during illness, and when skipping meals or not eating for a long time (fasting). It is important to know the symptoms of hypoglycemia and treat it right away. Always have a 15-gram rapid-acting carbohydrate snack with you to treat low blood glucose. Family members and close friends should also know the symptoms and should understand how to treat  hypoglycemia, in case you are not able to treat yourself. What are the symptoms of hypoglycemia? Hypoglycemia symptoms can include:  Hunger.  Anxiety.  Sweating and feeling clammy.  Confusion.  Dizziness or feeling light-headed.  Sleepiness.  Nausea.  Increased heart rate.  Headache.  Blurry vision.  Seizure.  Nightmares.  Tingling or numbness around the mouth, lips, or tongue.  A change in speech.  Decreased ability to concentrate.  A change in coordination.  Restless sleep.  Tremors or shakes.  Fainting.  Irritability.  How do I treat hypoglycemia?  If you are alert and able to swallow safely, follow the 15:15 rule:  Take 15 grams of a rapid-acting carbohydrate. Rapid-acting options include: ? 1 tube of glucose gel. ? 3 glucose pills. ? 6-8 pieces of hard candy. ? 4 oz (120 mL) of fruit juice. ? 4 oz (120 mL) of regular (not diet) soda.  Check your blood glucose 15 minutes after you take the carbohydrate.  If the repeat blood glucose level is still at or below 70 mg/dL (3.9 mmol/L), take 15 grams of a carbohydrate again.  If your blood glucose level does not increase above 70 mg/dL (3.9 mmol/L) after 3 tries, seek emergency medical care.  After your blood glucose level returns to normal, eat a meal or a snack within 1 hour.  How do I treat severe hypoglycemia? Severe hypoglycemia is when your blood glucose level is at or below 54 mg/dL (3 mmol/L). Severe hypoglycemia is an emergency. Do not wait to see if the symptoms will go away. Get medical help right away. Call your local emergency services (911 in the [place] Do not drive yourself to the hospital. If you have severe hypoglycemia and you cannot eat or drink, you may need an injection of glucagon. A family member or close friend should learn how to check your blood glucose and how to give you a glucagon injection. Ask your health care provider if you need to have an emergency glucagon injection kit  available. Severe hypoglycemia may need to be treated in a hospital. The treatment may include getting glucose through an IV tube. You may also need treatment for the cause of your hypoglycemia. Can having diabetes put me at risk for other conditions? Having diabetes can put you at risk for other long-term (chronic) conditions, such as heart disease and kidney disease. Your health care provider may prescribe medicines to help prevent complications from diabetes. These medicines may include:  Aspirin.  Medicine to lower cholesterol.  Medicine to control blood pressure.  What else can I do to manage my diabetes? Take  your diabetes medicines as told  If your health care provider prescribed insulin or diabetes medicines, take them every day.  Do not run out of insulin or other diabetes medicines that you take. Plan ahead so you always have these available.  If you use insulin, adjust your dosage based on how physically active you are and what foods you eat. Your health care provider will tell you how to adjust your dosage. Make healthy food choices  The things that you eat and drink affect your blood glucose and your insulin dosage. Making good choices helps to control your diabetes and prevent other health problems. A healthy meal plan includes eating lean proteins, complex carbohydrates, fresh fruits and vegetables, low-fat dairy products, and healthy fats. Make an appointment to see a diet and nutrition specialist (registered dietitian) to help you create an eating plan that is right for you. Make sure that you:  Follow instructions from your health care provider about eating or drinking restrictions.  Drink enough fluid to keep your urine clear or pale yellow.  Eat healthy snacks between nutritious meals.  Track the carbohydrates that you eat. Do this by reading food labels and learning the standard serving sizes of foods.  Follow your sick day plan whenever you cannot eat or drink as  usual. Make this plan in advance with your health care provider.  Stay active  Exercise regularly, as told by your health care provider. This may include:  Stretching and doing strength exercises, such as yoga or weightlifting, at least 2 times a week.  Doing at least 150 minutes of moderate-intensity or vigorous-intensity exercise each week. This could be brisk walking, biking, or water aerobics. ? Spread out your activity over at least 3 days of the week. ? Do not go more than 2 days in a row without doing some kind of physical activity.  When you start a new exercise or activity, work with your health care provider to adjust your insulin, medicines, or food intake as needed. Make healthy lifestyle choices  Do not use any tobacco products, such as cigarettes, chewing tobacco, and e-cigarettes. If you need help quitting, ask your health care provider.  If your health care provider says that alcohol is safe for you, limit alcohol intake to no more than 1 drink per day for nonpregnant women and 2 drinks per day for men. One drink equals 12 oz of beer, 5 oz of wine, or 1 oz of hard liquor.  Learn to manage stress. If you need help with this, ask your health care provider. Care for your body   Keep your immunizations up to date. In addition to getting vaccinations as told by your health care provider, it is recommended that you get vaccinated against the following illnesses: ? The flu (influenza). Get a flu shot every year. ? Pneumonia. ? Hepatitis B.  Schedule an eye exam soon after your diagnosis, and then one time every year after that.  Check your skin and feet every day for cuts, bruises, redness, blisters, or sores. Schedule a foot exam with your health care provider once every year.  Brush your teeth and gums two times a day, and floss at least one time a day. Visit your dentist at least once every 6 months.  Maintain a healthy weight. General instructions  Take  over-the-counter and prescription medicines only as told by your health care provider.  Share your diabetes management plan with people in your workplace, school, and household.  Check your urine  for ketones when you are ill and as told by your health care provider.  Ask your health care provider: ? Do I need to meet with a diabetes educator? ? Where can I find a support group for people with diabetes?  Carry a medical alert card or wear medical alert jewelry.  Keep all follow-up visits as told by your health care provider. This is important. Where to find more information: For more information about diabetes, visit:  American Diabetes Association (ADA): www.diabetes.org  American Association of Diabetes Educators (AADE): www.diabeteseducator.org/patient-resources  This information is not intended to replace advice given to you by your health care provider. Make sure you discuss any questions you have with your health care provider. Document Released: 10/04/2015 Document Revised: 11/18/2015 Document Reviewed: 07/16/2015 Elsevier Interactive Patient Education  2017 [employer].      Blood Glucose Monitoring, Adult Monitoring your blood sugar (glucose) helps you manage your diabetes. It also helps you and your health care provider determine how well your diabetes management plan is working. Blood glucose monitoring involves checking your blood glucose as often as directed, and keeping a record (log) of your results over time. Why should I monitor my blood glucose? Checking your blood glucose regularly can:  Help you understand how food, exercise, illnesses, and medicines affect your blood glucose.  Let you know what your blood glucose is at any time. You can quickly tell if you are having low blood glucose (hypoglycemia) or high blood glucose (hyperglycemia).  Help you and your health care provider adjust your medicines as needed.  When should I check my blood glucose? Follow  instructions from your health care provider about how often to check your blood glucose.   This may depend on:  The type of diabetes you have.  How well-controlled your diabetes is.  Medicines you are taking.  If you have type 1 diabetes:  Check your blood glucose at least 2 times a day.  Also check your blood glucose: ? Before every insulin injection. ? Before and after exercise. ? Between meals. ? 2 hours after a meal. ? Occasionally between 2:00 a.m. and 3:00 a.m., as directed. ? Before potentially dangerous tasks, like driving or using heavy machinery. ? At bedtime.  You may need to check your blood glucose more often, up to 6-10 times a day: ? If you use an insulin pump. ? If you need multiple daily injections (MDI). ? If your diabetes is not well-controlled. ? If you are ill. ? If you have a history of severe hypoglycemia. ? If you have a history of not knowing when your blood glucose is getting low (hypoglycemia unawareness).  If you have type 2 diabetes:  If you take insulin or other diabetes medicines, check your blood glucose at least 2 times a day.  If you are on intensive insulin therapy, check your blood glucose at least 4 times a day. Occasionally, you may also need to check between 2:00 a.m. and 3:00 a.m., as directed.  Also check your blood glucose: ? Before and after exercise. ? Before potentially dangerous tasks, like driving or using heavy machinery.  You may need to check your blood glucose more often if: ? Your medicine is being adjusted. ? Your diabetes is not well-controlled. ? You are ill.  What is a blood glucose log?  A blood glucose log is a record of your blood glucose readings. It helps you and your health care provider: ? Look for patterns in your blood glucose over  time. ? Adjust your diabetes management plan as needed.  Every time you check your blood glucose, write down your result and notes about things that may be affecting your  blood glucose, such as your diet and exercise for the day.  Most glucose meters store a record of glucose readings in the meter. Some meters allow you to download your records to a computer. How do I check my blood glucose? Follow these steps to get accurate readings of your blood glucose: Supplies needed   Blood glucose meter.  Test strips for your meter. Each meter has its own strips. You must use the strips that come with your meter.  A needle to prick your finger (lancet). Do not use lancets more than once.  A device that holds the lancet (lancing device).  A journal or log book to write down your results.  Procedure  Wash your hands with soap and water.  Prick the side of your finger (not the tip) with the lancet. Use a different finger each time.  Gently rub the finger until a small drop of blood appears.  Follow instructions that come with your meter for inserting the test strip, applying blood to the strip, and using your blood glucose meter.  Write down your result and any notes.  Alternative testing sites  Some meters allow you to use areas of your body other than your finger (alternative sites) to test your blood.  If you think you may have hypoglycemia, or if you have hypoglycemia unawareness, do not use alternative sites. Use your finger instead.  Alternative sites may not be as accurate as the fingers, because blood flow is slower in these areas. This means that the result you get may be delayed, and it may be different from the result that you would get from your finger.  The most common alternative sites are: ? Forearm. ? Thigh. ? Palm of the hand.  Additional tips  Always keep your supplies with you.  If you have questions or need help, all blood glucose meters have a 24-hour hotline number that you can call. You may also contact your health care provider.  After you use a few boxes of test strips, adjust (calibrate) your blood glucose meter by  following instructions that came with your meter.    The American Diabetes Association suggests the following targets for most nonpregnant adults with diabetes.  More or less stringent glycemic goals may be appropriate for each individual.  A1C: Less than 7% A1C may also be reported as eAG: Less than 154 mg/dl Before a meal (preprandial plasma glucose): 80-130 mg/dl 1-2 hours after beginning of the meal (Postprandial plasma glucose)*: Less than 180 mg/dl  *Postprandial glucose may be targeted if A1C goals are not met despite reaching preprandial glucose goals.   GOALS in short:  The goals are for the Hgb A1C to be less than 7.0 & blood pressure to be less than 130/80.    It is recommended that all diabetics are educated on and follow a healthy diabetic diet, exercise for 30 minutes 3-4 times per week (walking, biking, swimming, or machine), monitor blood glucose readings and bring that record with you to be reviewed at your next office visit.     You should be checking fasting blood sugars- especially after you eat poorly or eat really healthy, and also check 2 hour postprandial blood sugars after largest meal of the day.    Write these down and bring in your log at each  office visit.    You will need to be seen every 3 months by the provider managing your Diabetes unless told otherwise by that provider.   You will need yearly eye exams from an eye specialist and foot exams to check the nerves of your feet.  Also, your urine should be checked yearly as well to make sure excess protein is not present.   If you are checking your blood pressure at home, please record it and bring it to your next office visit.    Follow the Dietary Approaches to Stop Hypertension (DASH) diet (3 servings of fruit and vegetables daily, whole grains, low sodium, low-fat proteins).  See below.    Lastly, when it comes to your cholesterol, the goal is to have the HDL (good cholesterol) >40, and the LDL (bad  cholesterol) <100.   It is recommended that you follow a heart healthy, low saturated and trans-fat diet and exercise for 30 minutes at least 5 times a week.     (( Check out the DASH diet = 1.5 Gram Low Sodium Diet   A 1.5 gram sodium diet restricts the amount of sodium in the diet to no more than 1.5 g or 1500 mg daily.  The American Heart Association recommends Americans over the age of 73 to consume no more than 1500 mg of sodium each day to reduce the risk of developing high blood pressure.  Research also shows that limiting sodium may reduce heart attack and stroke risk.  Many foods contain sodium for flavor and sometimes as a preservative.  When the amount of sodium in a diet needs to be low, it is important to know what to look for when choosing foods and drinks.  The following includes some information and guidelines to help make it easier for you to adapt to a low sodium diet.    QUICK TIPS  Do not add salt to food.  Avoid convenience items and fast food.  Choose unsalted snack foods.  Buy lower sodium products, often labeled as "lower sodium" or "no salt added."  Check food labels to learn how much sodium is in 1 serving.  When eating at a restaurant, ask that your food be prepared with less salt or none, if possible.    READING FOOD LABELS FOR SODIUM INFORMATION  The nutrition facts label is a good place to find how much sodium is in foods. Look for products with no more than 400 mg of sodium per serving.  Remember that 1.5 g = 1500 mg.  The food label may also list foods as:  Sodium-free: Less than 5 mg in a serving.  Very low sodium: 35 mg or less in a serving.  Low-sodium: 140 mg or less in a serving.  Light in sodium: 50% less sodium in a serving. For example, if a food that usually has 300 mg of sodium is changed to become light in sodium, it will have 150 mg of sodium.  Reduced sodium: 25% less sodium in a serving. For example, if a food that usually has 400 mg of sodium  is changed to reduced sodium, it will have 300 mg of sodium.    CHOOSING FOODS  Grains  Avoid: Salted crackers and snack items. Some cereals, including instant hot cereals. Bread stuffing and biscuit mixes. Seasoned rice or pasta mixes.  Choose: Unsalted snack items. Low-sodium cereals, oats, puffed wheat and rice, shredded wheat. English muffins and bread. Pasta.  Meats  Avoid: Salted, canned, smoked, spiced,  pickled meats, including fish and poultry. Bacon, ham, sausage, cold cuts, hot dogs, anchovies.  Choose: Low-sodium canned tuna and salmon. Fresh or frozen meat, poultry, and fish.  Dairy  Avoid: Processed cheese and spreads. Cottage cheese. Buttermilk and condensed milk. Regular cheese.  Choose: Milk. Low-sodium cottage cheese. Yogurt. Sour cream. Low-sodium cheese.  Fruits and Vegetables  Avoid: Regular canned vegetables. Regular canned tomato sauce and paste. Frozen vegetables in sauces. Olives. Angie Fava. Relishes. Sauerkraut.  Choose: Low-sodium canned vegetables. Low-sodium tomato sauce and paste. Frozen or fresh vegetables. Fresh and frozen fruit.  Condiments  Avoid: Canned and packaged gravies. Worcestershire sauce. Tartar sauce. Barbecue sauce. Soy sauce. Steak sauce. Ketchup. Onion, garlic, and table salt. Meat flavorings and tenderizers.  Choose: Fresh and dried herbs and spices. Low-sodium varieties of mustard and ketchup. Lemon juice. Tabasco sauce. Horseradish.    SAMPLE 1.5 GRAM SODIUM MEAL PLAN:   Breakfast / Sodium (mg)  1 cup low-fat milk / 143 mg  1 whole-wheat English muffin / 240 mg  1 tbs heart-healthy margarine / 153 mg  1 hard-boiled egg / 139 mg  1 small orange / 0 mg  Lunch / Sodium (mg)  1 cup raw carrots / 76 mg  2 tbs no salt added peanut butter / 5 mg  2 slices whole-wheat bread / 270 mg  1 tbs jelly / 6 mg   cup red grapes / 2 mg  Dinner / Sodium (mg)  1 cup whole-wheat pasta / 2 mg  1 cup low-sodium tomato sauce / 73 mg  3 oz lean ground beef  / 57 mg  1 small side salad (1 cup raw spinach leaves,  cup cucumber,  cup yellow bell pepper) with 1 tsp olive oil and 1 tsp red wine vinegar / 25 mg  Snack / Sodium (mg)  1 container low-fat vanilla yogurt / 107 mg  3 graham cracker squares / 127 mg  Nutrient Analysis  Calories: 1745  Protein: 75 g  Carbohydrate: 237 g  Fat: 57 g  Sodium: 1425 mg  Document Released: 06/12/2005 Document Revised: 02/22/2011 Document Reviewed: 09/13/2009  ExitCare Patient Information 2012 [place].))    This information is not intended to replace advice given to you by your health care provider. Make sure you discuss any questions you have with your health care provider. Document Released: 06/15/2003 Document Revised: 12/31/2015 Document Reviewed: 11/22/2015 Elsevier Interactive Patient Education  2017 [employer].
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
## 2018-08-26 ENCOUNTER — Other Ambulatory Visit (INDEPENDENT_AMBULATORY_CARE_PROVIDER_SITE_OTHER): Payer: BLUE CROSS/BLUE SHIELD

## 2018-08-26 DIAGNOSIS — E0822 Diabetes mellitus due to underlying condition with diabetic chronic kidney disease: Secondary | ICD-10-CM | POA: Diagnosis not present

## 2018-08-26 DIAGNOSIS — N183 Chronic kidney disease, stage 3 unspecified: Secondary | ICD-10-CM

## 2018-08-26 DIAGNOSIS — N289 Disorder of kidney and ureter, unspecified: Secondary | ICD-10-CM | POA: Diagnosis not present

## 2018-08-26 DIAGNOSIS — E781 Pure hyperglyceridemia: Secondary | ICD-10-CM | POA: Diagnosis not present

## 2018-08-26 NOTE — Progress Notes (Signed)
micor

## 2018-08-26 NOTE — Addendum Note (Signed)
Addended by: Leda Min D on: 08/26/2018 12:28 PM   Modules accepted: Orders

## 2018-08-27 LAB — COMPREHENSIVE METABOLIC PANEL
ALT: 81 IU/L — AB (ref 0–44)
AST: 38 IU/L (ref 0–40)
Albumin/Globulin Ratio: 2.1 (ref 1.2–2.2)
Albumin: 4.7 g/dL (ref 4.0–5.0)
Alkaline Phosphatase: 75 IU/L (ref 39–117)
BUN/Creatinine Ratio: 13 (ref 9–20)
BUN: 15 mg/dL (ref 6–20)
Bilirubin Total: 0.4 mg/dL (ref 0.0–1.2)
CO2: 22 mmol/L (ref 20–29)
Calcium: 9.4 mg/dL (ref 8.7–10.2)
Chloride: 102 mmol/L (ref 96–106)
Creatinine, Ser: 1.14 mg/dL (ref 0.76–1.27)
GFR calc Af Amer: 95 mL/min/{1.73_m2} (ref >59)
GFR calc non Af Amer: 82 mL/min/{1.73_m2} (ref >59)
Globulin, Total: 2.2 g/dL (ref 1.5–4.5)
Glucose: 127 mg/dL — ABNORMAL HIGH (ref 65–99)
Potassium: 4.8 mmol/L (ref 3.5–5.2)
Sodium: 141 mmol/L (ref 134–144)
Total Protein: 6.9 g/dL (ref 6.0–8.5)

## 2018-08-27 LAB — HEMOGLOBIN A1C
ESTIMATED AVERAGE GLUCOSE: 151 mg/dL
HEMOGLOBIN A1C: 6.9 % — AB (ref 4.8–5.6)

## 2018-08-27 LAB — LIPID PANEL
Chol/HDL Ratio: 6.8 ratio — ABNORMAL HIGH (ref 0.0–5.0)
Cholesterol, Total: 210 mg/dL — ABNORMAL HIGH (ref 100–199)
HDL: 31 mg/dL — ABNORMAL LOW (ref >39)
LDL Calculated: 138 mg/dL — ABNORMAL HIGH (ref 0–99)
Triglycerides: 207 mg/dL — ABNORMAL HIGH (ref 0–149)
VLDL Cholesterol Cal: 41 mg/dL — ABNORMAL HIGH (ref 5–40)

## 2018-08-28 LAB — MICROALBUMIN / CREATININE URINE RATIO
Creatinine, Urine: 251.8 mg/dL
Microalb/Creat Ratio: 4 mg/g creat (ref 0–29)
Microalbumin, Urine: 9.5 ug/mL

## 2018-09-05 ENCOUNTER — Ambulatory Visit: Payer: Self-pay | Admitting: Family Medicine

## 2018-09-11 ENCOUNTER — Encounter: Payer: Self-pay | Admitting: Gastroenterology

## 2018-09-23 ENCOUNTER — Encounter: Payer: Self-pay | Admitting: Family Medicine

## 2018-09-23 ENCOUNTER — Ambulatory Visit (INDEPENDENT_AMBULATORY_CARE_PROVIDER_SITE_OTHER): Payer: BLUE CROSS/BLUE SHIELD | Admitting: Family Medicine

## 2018-09-23 ENCOUNTER — Other Ambulatory Visit: Payer: Self-pay

## 2018-09-23 VITALS — Temp 97.8°F | Wt 230.0 lb

## 2018-09-23 DIAGNOSIS — E782 Mixed hyperlipidemia: Secondary | ICD-10-CM

## 2018-09-23 DIAGNOSIS — E781 Pure hyperglyceridemia: Secondary | ICD-10-CM | POA: Diagnosis not present

## 2018-09-23 DIAGNOSIS — E1169 Type 2 diabetes mellitus with other specified complication: Secondary | ICD-10-CM

## 2018-09-23 DIAGNOSIS — Z79899 Other long term (current) drug therapy: Secondary | ICD-10-CM | POA: Insufficient documentation

## 2018-09-23 DIAGNOSIS — E786 Lipoprotein deficiency: Secondary | ICD-10-CM | POA: Insufficient documentation

## 2018-09-23 DIAGNOSIS — G894 Chronic pain syndrome: Secondary | ICD-10-CM | POA: Insufficient documentation

## 2018-09-23 DIAGNOSIS — E0822 Diabetes mellitus due to underlying condition with diabetic chronic kidney disease: Secondary | ICD-10-CM

## 2018-09-23 DIAGNOSIS — N183 Chronic kidney disease, stage 3 (moderate): Secondary | ICD-10-CM

## 2018-09-23 DIAGNOSIS — N182 Chronic kidney disease, stage 2 (mild): Secondary | ICD-10-CM

## 2018-09-23 DIAGNOSIS — R7401 Elevation of levels of liver transaminase levels: Secondary | ICD-10-CM | POA: Insufficient documentation

## 2018-09-23 DIAGNOSIS — F39 Unspecified mood [affective] disorder: Secondary | ICD-10-CM | POA: Insufficient documentation

## 2018-09-23 DIAGNOSIS — R740 Nonspecific elevation of levels of transaminase and lactic acid dehydrogenase [LDH]: Secondary | ICD-10-CM

## 2018-09-23 MED ORDER — SIMVASTATIN 20 MG PO TABS: 20.0000 mg | ORAL_TABLET | Freq: Every day | ORAL | 0 refills | Status: DC

## 2018-09-23 MED ORDER — GLIPIZIDE ER 5 MG PO TB24: 5.0000 mg | ORAL_TABLET | Freq: Every day | ORAL | 1 refills | Status: DC
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
## 2018-09-25 ENCOUNTER — Ambulatory Visit (INDEPENDENT_AMBULATORY_CARE_PROVIDER_SITE_OTHER): Payer: BLUE CROSS/BLUE SHIELD | Admitting: Psychiatry

## 2018-09-25 ENCOUNTER — Other Ambulatory Visit: Payer: Self-pay

## 2018-09-25 DIAGNOSIS — F4310 Post-traumatic stress disorder, unspecified: Secondary | ICD-10-CM | POA: Diagnosis not present

## 2018-09-25 DIAGNOSIS — F332 Major depressive disorder, recurrent severe without psychotic features: Secondary | ICD-10-CM
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
## 2018-10-08 ENCOUNTER — Other Ambulatory Visit: Payer: Self-pay

## 2018-10-08 ENCOUNTER — Ambulatory Visit (INDEPENDENT_AMBULATORY_CARE_PROVIDER_SITE_OTHER): Payer: BLUE CROSS/BLUE SHIELD | Admitting: Gastroenterology

## 2018-10-08 ENCOUNTER — Encounter: Payer: Self-pay | Admitting: Gastroenterology

## 2018-10-08 VITALS — Ht 71.0 in | Wt 230.0 lb

## 2018-10-08 DIAGNOSIS — R740 Nonspecific elevation of levels of transaminase and lactic acid dehydrogenase [LDH]: Secondary | ICD-10-CM

## 2018-10-08 DIAGNOSIS — K22719 Barrett's esophagus with dysplasia, unspecified: Secondary | ICD-10-CM

## 2018-10-08 DIAGNOSIS — K219 Gastro-esophageal reflux disease without esophagitis: Secondary | ICD-10-CM | POA: Diagnosis not present

## 2018-10-08 DIAGNOSIS — R7401 Elevation of levels of liver transaminase levels: Secondary | ICD-10-CM

## 2018-10-08 MED ORDER — OMEPRAZOLE 40 MG PO CPDR: 40.0000 mg | DELAYED_RELEASE_CAPSULE | Freq: Every day | ORAL | 3 refills | Status: DC
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
## 2018-10-21 ENCOUNTER — Telehealth: Payer: Self-pay

## 2018-10-21 NOTE — Telephone Encounter (Signed)
visit 4-13: needs egd in the [facility] for Barrett's/gerd  Called pt on 4-27 and offered him a April or May spot - he would like to wait until June because of insurance.

## 2018-11-07 ENCOUNTER — Other Ambulatory Visit: Payer: Self-pay

## 2018-11-07 ENCOUNTER — Other Ambulatory Visit: Payer: BLUE CROSS/BLUE SHIELD

## 2018-11-07 DIAGNOSIS — Z79899 Other long term (current) drug therapy: Secondary | ICD-10-CM

## 2018-11-07 DIAGNOSIS — E786 Lipoprotein deficiency: Secondary | ICD-10-CM

## 2018-11-07 DIAGNOSIS — N182 Chronic kidney disease, stage 2 (mild): Secondary | ICD-10-CM | POA: Diagnosis not present

## 2018-11-07 DIAGNOSIS — E782 Mixed hyperlipidemia: Secondary | ICD-10-CM | POA: Diagnosis not present

## 2018-11-07 DIAGNOSIS — E781 Pure hyperglyceridemia: Secondary | ICD-10-CM

## 2018-11-07 DIAGNOSIS — E1169 Type 2 diabetes mellitus with other specified complication: Secondary | ICD-10-CM | POA: Diagnosis not present

## 2018-11-08 LAB — COMPREHENSIVE METABOLIC PANEL
ALT: 35 IU/L (ref 0–44)
AST: 20 IU/L (ref 0–40)
Albumin/Globulin Ratio: 2.1 (ref 1.2–2.2)
Albumin: 4.9 g/dL (ref 4.0–5.0)
Alkaline Phosphatase: 85 IU/L (ref 39–117)
BUN/Creatinine Ratio: 10 (ref 9–20)
BUN: 12 mg/dL (ref 6–20)
Bilirubin Total: 0.4 mg/dL (ref 0.0–1.2)
CO2: 24 mmol/L (ref 20–29)
Calcium: 9.9 mg/dL (ref 8.7–10.2)
Chloride: 102 mmol/L (ref 96–106)
Creatinine, Ser: 1.21 mg/dL (ref 0.76–1.27)
GFR calc Af Amer: 89 mL/min/{1.73_m2} (ref >59)
GFR calc non Af Amer: 77 mL/min/{1.73_m2} (ref >59)
Globulin, Total: 2.3 g/dL (ref 1.5–4.5)
Glucose: 121 mg/dL — ABNORMAL HIGH (ref 65–99)
Potassium: 5.1 mmol/L (ref 3.5–5.2)
Sodium: 140 mmol/L (ref 134–144)
Total Protein: 7.2 g/dL (ref 6.0–8.5)

## 2018-11-08 LAB — HEMOGLOBIN A1C
Est. average glucose Bld gHb Est-mCnc: 131 mg/dL
Hgb A1c MFr Bld: 6.2 % — ABNORMAL HIGH (ref 4.8–5.6)

## 2018-11-08 LAB — MAGNESIUM: Magnesium: 2.2 mg/dL (ref 1.6–2.3)

## 2018-11-20 ENCOUNTER — Telehealth: Payer: Self-pay

## 2018-11-20 NOTE — Telephone Encounter (Signed)
visit 4-13: needs egd in the [facility] for Barrett's/gerd    Called pt on 4-27 and offered him a April or May spot - he would like to wait until June because of "insurance".  5-27: Called and LM for pt to call back to schedule EGD in the [facility] in June or July

## 2018-11-21 ENCOUNTER — Other Ambulatory Visit (HOSPITAL_COMMUNITY): Payer: Self-pay | Admitting: Psychiatry

## 2018-11-21 ENCOUNTER — Other Ambulatory Visit: Payer: Self-pay

## 2018-11-21 ENCOUNTER — Encounter: Payer: Self-pay | Admitting: Family Medicine

## 2018-11-21 ENCOUNTER — Ambulatory Visit (INDEPENDENT_AMBULATORY_CARE_PROVIDER_SITE_OTHER): Payer: BLUE CROSS/BLUE SHIELD | Admitting: Family Medicine

## 2018-11-21 VITALS — Ht 71.0 in | Wt 230.0 lb

## 2018-11-21 DIAGNOSIS — R7401 Elevation of levels of liver transaminase levels: Secondary | ICD-10-CM

## 2018-11-21 DIAGNOSIS — N183 Chronic kidney disease, stage 3 unspecified: Secondary | ICD-10-CM

## 2018-11-21 DIAGNOSIS — R740 Nonspecific elevation of levels of transaminase and lactic acid dehydrogenase [LDH]: Secondary | ICD-10-CM | POA: Diagnosis not present

## 2018-11-21 DIAGNOSIS — E0822 Diabetes mellitus due to underlying condition with diabetic chronic kidney disease: Secondary | ICD-10-CM

## 2018-11-21 DIAGNOSIS — E781 Pure hyperglyceridemia: Secondary | ICD-10-CM

## 2018-11-21 DIAGNOSIS — N182 Chronic kidney disease, stage 2 (mild): Secondary | ICD-10-CM | POA: Insufficient documentation

## 2018-11-21 DIAGNOSIS — F39 Unspecified mood [affective] disorder: Secondary | ICD-10-CM

## 2018-11-21 DIAGNOSIS — E1169 Type 2 diabetes mellitus with other specified complication: Secondary | ICD-10-CM | POA: Diagnosis not present

## 2018-11-21 DIAGNOSIS — E119 Type 2 diabetes mellitus without complications: Secondary | ICD-10-CM | POA: Insufficient documentation

## 2018-11-21 DIAGNOSIS — E782 Mixed hyperlipidemia: Secondary | ICD-10-CM

## 2018-11-21 DIAGNOSIS — F4310 Post-traumatic stress disorder, unspecified: Secondary | ICD-10-CM
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
## 2018-11-26 ENCOUNTER — Other Ambulatory Visit: Payer: Self-pay

## 2018-11-26 ENCOUNTER — Ambulatory Visit (INDEPENDENT_AMBULATORY_CARE_PROVIDER_SITE_OTHER): Payer: BC Managed Care – PPO | Admitting: Psychiatry

## 2018-11-26 ENCOUNTER — Encounter (HOSPITAL_COMMUNITY): Payer: Self-pay | Admitting: Psychiatry

## 2018-11-26 DIAGNOSIS — F332 Major depressive disorder, recurrent severe without psychotic features: Secondary | ICD-10-CM | POA: Diagnosis not present

## 2018-11-26 DIAGNOSIS — F4310 Post-traumatic stress disorder, unspecified: Secondary | ICD-10-CM | POA: Diagnosis not present

## 2018-11-26 MED ORDER — LAMOTRIGINE 100 MG PO TABS: ORAL_TABLET | ORAL | 1 refills | Status: DC

## 2018-11-26 MED ORDER — HYDROXYZINE PAMOATE 25 MG PO CAPS: 25.0000 mg | ORAL_CAPSULE | Freq: Three times a day (TID) | ORAL | 0 refills | Status: DC | PRN

## 2018-11-26 NOTE — Progress Notes (Signed)
Virtual Visit via Telephone Note  I connected with Harold Collins on 11/26/18 at  9:00 AM EDT by telephone and verified that I am speaking with the correct person using two identifiers.   I discussed the limitations, risks, security and privacy concerns of performing an evaluation and management service by telephone and the availability of in person appointments. I also discussed with the patient that there may be a patient responsible charge related to this service. The patient expressed understanding and agreed to proceed.   History of Present Illness: Patient was evaluated through phone session.  He has been experiencing increased anxiety, irritability, mood swing and having nightmares and flashback.  On the last visit he decided to stop the medication on his own and he was without the medication for more than 2 months.  Now he realizes he need to go back on his medication.  He admitted poor sleep, racing thoughts a lot of anxiety and having marital issues due to his mood.  He also endorsed sometimes passive and fleeting suicidal thoughts but no plan or any intent as he like his 55-year-old son and wants to things get better.  He is currently not seeing any therapist but like to have a therapy resume and he is searching that he cannot afford.  He is still not working but actively looking for a job.  He admitted financial strain as he does not have medical insurance.  He recently seen his primary care physician.  He endorsed that he lost weight and his hemoglobin A1c is improved.  He denies any homicidal thoughts or any hallucination but admitted sometimes paranoia due to current environment outside.  He like to protect his family and he admitted some time he is too cautious and does not sleep at night.  He denies drinking or using any illegal substances.  We talked about medication and he does not want to go back on Effexor, Lexapro, BuSpar and Paxil.  However he realized Lamictal was helping his mood a lot  and he is willing to go back on Lamictal but also like to try something else to help his anxiety.   Past Psychiatric History:Reviewed. History of depression and as a teenager he had cut himself and taken overdose butnoinpatient treatment.History of severe mood swing, anger, fight, depression. While he was in the [employer] he was sent to jail for 10 days due to assault charges. In the past he tried Paxil, Vistaril then Lexapro, Brintellix, and Effexor. We did gene testing Prozac and Lamictal are favorable.   Recent Results (from the past 2160 hour(s))  Hemoglobin A1c     Status: Abnormal   Collection Time: 11/07/18  8:10 AM  Result Value Ref Range   Hgb A1c MFr Bld 6.2 (H) 4.8 - 5.6 %    Comment:          Prediabetes: 5.7 - 6.4          Diabetes: >6.4          Glycemic control for adults with diabetes: <7.0    Est. average glucose Bld gHb Est-mCnc 131 mg/dL  Magnesium     Status: None   Collection Time: 11/07/18  8:10 AM  Result Value Ref Range   Magnesium 2.2 1.6 - 2.3 mg/dL  Comprehensive metabolic panel     Status: Abnormal   Collection Time: 11/07/18  8:10 AM  Result Value Ref Range   Glucose 121 (H) 65 - 99 mg/dL   BUN 12 6 - 20 mg/dL  Creatinine, Ser 1.21 0.76 - 1.27 mg/dL   GFR calc non Af Amer 77 >59 mL/min/1.73   GFR calc Af Amer 89 >59 mL/min/1.73   BUN/Creatinine Ratio 10 9 - 20   Sodium 140 134 - 144 mmol/L   Potassium 5.1 3.5 - 5.2 mmol/L   Chloride 102 96 - 106 mmol/L   CO2 24 20 - 29 mmol/L   Calcium 9.9 8.7 - 10.2 mg/dL   Total Protein 7.2 6.0 - 8.5 g/dL   Albumin 4.9 4.0 - 5.0 g/dL   Globulin, Total 2.3 1.5 - 4.5 g/dL   Albumin/Globulin Ratio 2.1 1.2 - 2.2   Bilirubin Total 0.4 0.0 - 1.2 mg/dL   Alkaline Phosphatase 85 39 - 117 IU/L   AST 20 0 - 40 IU/L   ALT 35 0 - 44 IU/L    Psychiatric Specialty Exam: Physical Exam  ROS  There were no vitals taken for this visit.There is no height or weight on file to calculate BMI.  General Appearance: NA  Eye  Contact:  NA  Speech:  Normal Rate  Volume:  Normal  Mood:  Depressed, Dysphoric and Irritable  Affect:  NA  Thought Process:  Descriptions of Associations: Intact  Orientation:  Full (Time, Place, and Person)  Thought Content:  Paranoid Ideation and Rumination  Suicidal Thoughts:  Passive and fleeting suicidal thoughts but no plan or any intent.  Homicidal Thoughts:  No  Memory:  Immediate;   Good Recent;   Good Remote;   Good  Judgement:  Fair  Insight:  Fair  Psychomotor Activity:  NA  Concentration:  Concentration: Fair and Attention Span: Fair  Recall:  Good  Fund of Knowledge:  Good  Language:  Good  Akathisia:  NA  Handed:  Right  AIMS (if indicated):     Assets:  Communication Skills Desire for Improvement Housing Resilience Social Support  ADL's:  Intact  Cognition:  WNL  Sleep:   fair      Assessment and Plan: Posttraumatic stress disorder.  Major depressive disorder, recurrent moderate.  I reviewed his medication, recent blood work results and records from his primary care physician.  His hemoglobin A1c is improved from the past.  He lost weight.  Patient like to go back on Lamictal which realize helped him his mood irritability and anger.  He also like something to help his anxiety however in the past he took BuSpar, Paxil, Lexapro and Effexor which he believes did not help him and he like to try a new medication.  We discussed starting hydroxyzine which she has never tried before.  I recommend to try Vistaril 25 mg 3 times a day as needed to help his anxiety and insomnia.  It can also help his nightmares and flashback.  We will resume Lamictal 50 mg for 10 days and then 100 mg daily.  Reminded that Lamictal can cause rash and in that case he need to stop the medication immediately.  Patient has a pharmacy program through [employer] and he like to send his prescription there so he can afford.  He is also actively looking for a job.  He also like to resume therapy and  searching a therapist who he can afford.  I offered a therapist in our office but he like to find on his own.  Discussed safety concern that anytime having active suicidal thoughts or homicidal thought or any aggression or violence that he need to call 911 or go to local emergency room.  Follow-up  in 4 weeks.  Follow Up Instructions:    I discussed the assessment and treatment plan with the patient. The patient was provided an opportunity to ask questions and all were answered. The patient agreed with the plan and demonstrated an understanding of the instructions.   The patient was advised to call back or seek an in-person evaluation if the symptoms worsen or if the condition fails to improve as anticipated.  I provided 25 minutes of non-face-to-face time during this encounter.   Cleotis Nipper, MD

## 2018-11-27 NOTE — Telephone Encounter (Signed)
Called and spoke to pt.  He has some upcoming interviews and needs to push out his procedure until he has new insurance.  He will call us to schedule once he has new insurance.

## 2018-12-11 ENCOUNTER — Other Ambulatory Visit: Payer: Self-pay

## 2018-12-11 ENCOUNTER — Other Ambulatory Visit: Payer: BC Managed Care – PPO

## 2018-12-11 DIAGNOSIS — Z20828 Contact with and (suspected) exposure to other viral communicable diseases: Secondary | ICD-10-CM | POA: Diagnosis not present

## 2018-12-11 DIAGNOSIS — Z20822 Contact with and (suspected) exposure to covid-19: Secondary | ICD-10-CM

## 2018-12-12 LAB — SARS-COV-2 ANTIBODIES: SARS-CoV-2 Antibodies: NEGATIVE

## 2018-12-19 ENCOUNTER — Telehealth: Payer: Self-pay | Admitting: Family Medicine

## 2018-12-19 NOTE — Telephone Encounter (Signed)
Patient notified of results. MPulliam, CMA/RT(R)  

## 2018-12-19 NOTE — Telephone Encounter (Signed)
Patient states Harold Collins left message for him to call office.  --Forwarding FYI to medical assistant.  --Dion Body

## 2018-12-23 ENCOUNTER — Ambulatory Visit (INDEPENDENT_AMBULATORY_CARE_PROVIDER_SITE_OTHER): Payer: BC Managed Care – PPO | Admitting: Psychiatry

## 2018-12-23 ENCOUNTER — Other Ambulatory Visit: Payer: Self-pay

## 2018-12-23 ENCOUNTER — Encounter (HOSPITAL_COMMUNITY): Payer: Self-pay | Admitting: Psychiatry

## 2018-12-23 DIAGNOSIS — F4310 Post-traumatic stress disorder, unspecified: Secondary | ICD-10-CM

## 2018-12-23 DIAGNOSIS — F332 Major depressive disorder, recurrent severe without psychotic features: Secondary | ICD-10-CM

## 2018-12-23 MED ORDER — LAMOTRIGINE 200 MG PO TABS: 200.0000 mg | ORAL_TABLET | Freq: Every day | ORAL | 1 refills | Status: DC

## 2018-12-23 MED ORDER — HYDROXYZINE PAMOATE 25 MG PO CAPS: 25.0000 mg | ORAL_CAPSULE | Freq: Three times a day (TID) | ORAL | 1 refills | Status: DC | PRN

## 2018-12-23 NOTE — Progress Notes (Signed)
Virtual Visit via Telephone Note  I connected with Harold Collins on 12/23/18 at  8:20 AM EDT by telephone and verified that I am speaking with the correct person using two identifiers.   I discussed the limitations, risks, security and privacy concerns of performing an evaluation and management service by telephone and the availability of in person appointments. I also discussed with the patient that there may be a patient responsible charge related to this service. The patient expressed understanding and agreed to proceed.   History of Present Illness: Patient was evaluated by phone session.  Since started Lamictal is feeling somewhat better.  He does not have any suicidal thoughts but he still feels anxious, nervous and sad mood.  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  He is currently in training and he does not have to deal with the people on a daily basis.  He feels it is less stressful.  He is sleeping better since taking the hydroxyzine.  He still have nightmares and flashback but they are not as intense and as frequent.  He takes hydroxyzine 3 times a day.  He reported no tremors, shakes or any EPS.  His appetite is okay.  He is trying to lose weight.  We have talked about therapy but due to his new job he is not sure if he has time to think about therapy.  Patient mention his new job requires at least 50 hours a week.  Patient denies any paranoia, hallucination, active suicidal thoughts or any homicidal thought.  He has no tremors, rash or any itching.  He is not drinking or using any illegal substances.   Past Psychiatric History:Reviewed. History of depression and as a teenager he had cut himself and taken overdose butnoinpatient treatment.History of severe mood swing, anger, fight, depression. While he was in the [employer] he was sent to jail for 10 days due to assault charges. In the past he tried Paxil, Buspar, Lexapro, Brintellix, and Effexor. We did gene  testing Prozac and Lamictal are favorable.   Psychiatric Specialty Exam: Physical Exam  ROS  There were no vitals taken for this visit.There is no height or weight on file to calculate BMI.  General Appearance: NA  Eye Contact:  NA  Speech:  Clear and Coherent and Slow  Volume:  Decreased  Mood:  Anxious and Dysphoric  Affect:  NA  Thought Process:  Goal Directed  Orientation:  Full (Time, Place, and Person)  Thought Content:  Rumination  Suicidal Thoughts:  No  Homicidal Thoughts:  No  Memory:  Immediate;   Good Recent;   Good Remote;   Good  Judgement:  Good  Insight:  Good  Psychomotor Activity:  NA  Concentration:  Concentration: Good and Attention Span: Good  Recall:  Good  Fund of Knowledge:  Good  Language:  Good  Akathisia:  No  Handed:  Right  AIMS (if indicated):     Assets:  Communication Skills Desire for Improvement Housing Resilience Social Support Transportation  ADL's:  Intact  Cognition:  WNL  Sleep:   improved      Assessment and Plan: Major depressive disorder, recurrent.  Posttraumatic stress disorder.  Patient slowly and gradually improving.  He is tolerating his Lamictal without any side effects.  Recommend to take Lamictal 150 mg 2 weeks and then 200 mg daily to help his residual mood lability.  Continue hydroxyzine 25 mg 3 times a day to help his anxiety and  insomnia.  Discussed medication side effects and benefits.  Recommended to call us back if is any question or any concern.  Discussed safety concern that anytime having active suicidal thoughts or homicidal thought that he need to call 911 of the local emergency room.  Follow-up in 6 weeks.  Follow Up Instructions:    I discussed the assessment and treatment plan with the patient. The patient was provided an opportunity to ask questions and all were answered. The patient agreed with the plan and demonstrated an understanding of the instructions.   The patient was advised to call back  or seek an in-person evaluation if the symptoms worsen or if the condition fails to improve as anticipated.  I provided 15 minutes of non-face-to-face time during this encounter.   Kathlee Nations, MD

## 2019-02-03 ENCOUNTER — Telehealth (HOSPITAL_COMMUNITY): Payer: Self-pay | Admitting: Psychiatry

## 2019-02-03 ENCOUNTER — Encounter (HOSPITAL_COMMUNITY): Payer: Self-pay | Admitting: Psychiatry

## 2019-02-03 ENCOUNTER — Ambulatory Visit (INDEPENDENT_AMBULATORY_CARE_PROVIDER_SITE_OTHER): Payer: Self-pay | Admitting: Psychiatry

## 2019-02-03 ENCOUNTER — Other Ambulatory Visit: Payer: Self-pay

## 2019-02-03 DIAGNOSIS — F4310 Post-traumatic stress disorder, unspecified: Secondary | ICD-10-CM

## 2019-02-03 DIAGNOSIS — F332 Major depressive disorder, recurrent severe without psychotic features: Secondary | ICD-10-CM

## 2019-02-03 MED ORDER — TRAZODONE HCL 50 MG PO TABS: 50.0000 mg | ORAL_TABLET | Freq: Every evening | ORAL | 1 refills | Status: DC | PRN

## 2019-02-03 MED ORDER — LAMOTRIGINE 200 MG PO TABS: 200.0000 mg | ORAL_TABLET | Freq: Every day | ORAL | 1 refills | Status: DC

## 2019-02-03 NOTE — Telephone Encounter (Signed)
02/03/19 1:23pm Patient returned call about his insurance and stated he didn't have insurance - patient is aware he will be self-pay.Marland KitchenMariana Kaufman

## 2019-02-03 NOTE — Progress Notes (Signed)
Virtual Visit via Telephone Note  I connected with Harold Collins on 02/03/19 at  4:20 PM EDT by telephone and verified that I am speaking with the correct person using two identifiers.   I discussed the limitations, risks, security and privacy concerns of performing an evaluation and management service by telephone and the availability of in person appointments. I also discussed with the patient that there may be a patient responsible charge related to this service. The patient expressed understanding and agreed to proceed.   History of Present Illness: Patient was evaluated by phone session.  He is taking Lamictal 200 which he started recently.  He is tolerating his medication but continues to endorse a lot of anxiety, poor sleep, irritability and racing thoughts.  He is overwhelmed with his new job which requires 50 hours a week.  He takes hydroxyzine to 3 times a day but also takes at nighttime.  He feels it is not helping his sleep.  We talked about therapy but due to his new job he is not sure if he has a time to do therapy.  He admitted irritability is up but denies any hallucination, paranoia, suicidal thoughts or homicidal thought.  Denies drinking or using any illegal substances.  He has no rash or itching.  He has nightmares and flashback and sometimes he feel medicine is not helping him.  Past Psychiatric History:Reviewed. History of depression and as a teenager. H/O cut himself and taken overdose butnoinpatient treatment.H/O severe mood swing, anger, fight, depression. While he was in the [employer] he was sent to jail for 10 days due to assault charges. In the past he tried Paxil, Buspar, Lexapro, prozac, Brintellix, and Effexor. We did gene testing Prozac and Lamictal are favorable.    Psychiatric Specialty Exam: Physical Exam  ROS  There were no vitals taken for this visit.There is no height or weight on file to calculate BMI.  General Appearance: NA  Eye Contact:  NA  Speech:   Clear and Coherent and Slow  Volume:  Decreased  Mood:  Anxious, Depressed and Irritable  Affect:  NA  Thought Process:  Goal Directed  Orientation:  Full (Time, Place, and Person)  Thought Content:  Rumination  Suicidal Thoughts:  No  Homicidal Thoughts:  No  Memory:  Immediate;   Good Recent;   Good Remote;   Good  Judgement:  Fair  Insight:  Good  Psychomotor Activity:  NA  Concentration:  Concentration: Fair and Attention Span: Fair  Recall:  Good  Fund of Knowledge:  Good  Language:  Good  Akathisia:  No  Handed:  Right  AIMS (if indicated):     Assets:  Communication Skills Desire for Improvement Housing Resilience Talents/Skills Transportation  ADL's:  Intact  Cognition:  WNL  Sleep:         Assessment and Plan: Major depressive disorder, recurrent.  Posttraumatic stress disorder.  We discussed his medication.  Recently he started taking 200 mg few days ago and I believe he should give more time to the medication.  One more time I encourage therapy but due to his new job he has no time.  I recommend to try trazodone 50 mg at bedtime and continue to take hydroxyzine 25 mg 2-3 times a day to help his anxiety.  Patient has no rash or itching.  Recommended to call us back if he has any question or concern.  Follow-up in 4 to 6 weeks.  Discuss safety concern that anytime having active suicidal  thoughts or homicidal thoughts and he need to call 911 or go to local emergency room.  Follow Up Instructions:    I discussed the assessment and treatment plan with the patient. The patient was provided an opportunity to ask questions and all were answered. The patient agreed with the plan and demonstrated an understanding of the instructions.   The patient was advised to call back or seek an in-person evaluation if the symptoms worsen or if the condition fails to improve as anticipated.  I provided 20 minutes of non-face-to-face time during this encounter.   Cleotis NipperSyed T Elianna Windom,  MD

## 2019-03-04 ENCOUNTER — Other Ambulatory Visit: Payer: Self-pay

## 2019-03-04 ENCOUNTER — Ambulatory Visit (INDEPENDENT_AMBULATORY_CARE_PROVIDER_SITE_OTHER): Payer: Self-pay | Admitting: Psychiatry

## 2019-03-04 ENCOUNTER — Encounter (HOSPITAL_COMMUNITY): Payer: Self-pay | Admitting: Psychiatry

## 2019-03-04 DIAGNOSIS — F332 Major depressive disorder, recurrent severe without psychotic features: Secondary | ICD-10-CM

## 2019-03-04 DIAGNOSIS — F4310 Post-traumatic stress disorder, unspecified: Secondary | ICD-10-CM

## 2019-03-04 MED ORDER — HYDROXYZINE PAMOATE 25 MG PO CAPS: 100.0000 mg | ORAL_CAPSULE | Freq: Three times a day (TID) | ORAL | 0 refills | Status: DC | PRN

## 2019-03-04 MED ORDER — QUETIAPINE FUMARATE 50 MG PO TABS: ORAL_TABLET | ORAL | 1 refills | Status: DC

## 2019-03-04 MED ORDER — LAMOTRIGINE 200 MG PO TABS: 200.0000 mg | ORAL_TABLET | Freq: Every day | ORAL | 1 refills | Status: DC

## 2019-03-04 NOTE — Progress Notes (Signed)
Virtual Visit via Telephone Note  I connected with Harold Collins on 03/04/19 at 11:40 AM EDT by telephone and verified that I am speaking with the correct person using two identifiers.   I discussed the limitations, risks, security and privacy concerns of performing an evaluation and management service by telephone and the availability of in person appointments. I also discussed with the patient that there may be a patient responsible charge related to this service. The patient expressed understanding and agreed to proceed.   History of Present Illness: Patient was evaluated by phone session.  On his last visit we recommend to try trazodone because he was unable to sleep as good.  He having irritability, racing thoughts, a lot of anxiety and stress related to his new job.  He is working 50 to 60 hours a week.  He is taking hydroxyzine 3 times a day but he does not feel it works as good for his anxiety.  He has not able to sleep good.  He admitted having nightmares and flashback.  We also encouraged to have therapy but due to his new job he does not have benefits at this time to see a therapist.  He denies any hallucination, paranoia, suicidal thoughts or homicidal thought.  He denies drinking or using any illegal substances.  Is compliant with Lamictal and reported no rash or any itching.  His energy level is fair.   Past Psychiatric History:Reviewed. H/O depression as a teenager. H/O cut himself and overdose but noinpatient treatment.H/O severe mood swing, night mares, anger,  fights and depression. While in [employer] he was sent to jail for 10 days due to assault charges. Tried tried Paxil, BuSpar, Lexapro, Prozac, Brintellix and Effexor.  Recently we tried trazodone (not helpful) We did gene testing Prozac and Lamictal are favorable.   Psychiatric Specialty Exam: Physical Exam  ROS  There were no vitals taken for this visit.There is no height or weight on file to calculate BMI.  General  Appearance: NA  Eye Contact:  NA  Speech:  Clear and Coherent and Slow  Volume:  Decreased  Mood:  Anxious  Affect:  NA  Thought Process:  Goal Directed  Orientation:  Full (Time, Place, and Person)  Thought Content:  Rumination  Suicidal Thoughts:  No  Homicidal Thoughts:  No  Memory:  Immediate;   Good Recent;   Good Remote;   Good  Judgement:  Good  Insight:  Good  Psychomotor Activity:  NA  Concentration:  Concentration: Good and Attention Span: Good  Recall:  Good  Fund of Knowledge:  Good  Language:  Good  Akathisia:  No  Handed:  Right  AIMS (if indicated):     Assets:  Communication Skills Desire for Improvement Resilience Social Support  ADL's:  Intact  Cognition:  WNL  Sleep:   poor      Assessment and Plan: Major depressive disorder, recurrent.  Posttraumatic stress disorder.  Discusses stress related to his job.  Discontinue trazodone since it does not work.  We will try Seroquel 50 mg at bedtime to help his insomnia, anxiety and irritability.  Continue Lamictal 200 mg daily and hydroxyzine 25 mg up to 3 times a day to help his anxiety.  Discussed medication side effects and benefits specially Seroquel can cause metabolic syndrome.  Recommend when he is ready then he should give [place] a call for therapy.  He agreed with the plan.  Discuss safety concern that anytime having active suicidal thoughts or homicidal thought  he need to call 911 or go to local emergency room.  Follow-up in 6 weeks.  Follow Up Instructions:    I discussed the assessment and treatment plan with the patient. The patient was provided an opportunity to ask questions and all were answered. The patient agreed with the plan and demonstrated an understanding of the instructions.   The patient was advised to call back or seek an in-person evaluation if the symptoms worsen or if the condition fails to improve as anticipated.  I provided 20 minutes of non-face-to-face time during this  encounter.   Cleotis NipperSyed T Arcangel Minion, MD

## 2019-04-17 ENCOUNTER — Ambulatory Visit (INDEPENDENT_AMBULATORY_CARE_PROVIDER_SITE_OTHER): Payer: Self-pay | Admitting: Psychiatry

## 2019-04-17 ENCOUNTER — Encounter (HOSPITAL_COMMUNITY): Payer: Self-pay | Admitting: Psychiatry

## 2019-04-17 ENCOUNTER — Other Ambulatory Visit: Payer: Self-pay

## 2019-04-17 DIAGNOSIS — F332 Major depressive disorder, recurrent severe without psychotic features: Secondary | ICD-10-CM

## 2019-04-17 DIAGNOSIS — F4310 Post-traumatic stress disorder, unspecified: Secondary | ICD-10-CM

## 2019-04-17 MED ORDER — LAMOTRIGINE 200 MG PO TABS: 200.0000 mg | ORAL_TABLET | Freq: Every day | ORAL | 1 refills | Status: DC

## 2019-04-17 MED ORDER — HYDROXYZINE PAMOATE 25 MG PO CAPS: 25.0000 mg | ORAL_CAPSULE | Freq: Four times a day (QID) | ORAL | 1 refills | Status: DC | PRN

## 2019-04-17 MED ORDER — QUETIAPINE FUMARATE 100 MG PO TABS: 100.0000 mg | ORAL_TABLET | Freq: Every day | ORAL | 1 refills | Status: DC

## 2019-04-17 NOTE — Progress Notes (Signed)
Virtual Visit via Telephone Note  I connected with Harold Collins on 04/17/19 at 11:20 AM EDT by telephone and verified that I am speaking with the correct person using two identifiers.   I discussed the limitations, risks, security and privacy concerns of performing an evaluation and management service by telephone and the availability of in person appointments. I also discussed with the patient that there may be a patient responsible charge related to this service. The patient expressed understanding and agreed to proceed.   History of Present Illness: Patient was evaluated by phone session.  He is now taking Seroquel 50 mg which helps some of his sleep.  He is able to get 7 hours but he still feel paranoia and very anxious around people.  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  He is taking hydroxyzine 25 mg 3 times a day and now he feels it helps to calm him down.  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  He denies any irritability, anger, mania or any psychosis.  Denies drinking or using any illegal substances.  He lost more than 15 pounds since he is walking every day and that helps his mood.  He has no rash, itching tremors or shakes.  I he is compliant with Lamictal.   Past Psychiatric History:Reviewed. H/O depression as a teenager. H/Ocut himself and overdose but noinpatient treatment.H/Osevere mood swing, night mares, anger,  fights and depression. While in [employer] spend 10 days jail due to assault charges. Tried Paxil, BuSpar, Lexapro, Prozac, Brintellix and Effexor.  Recently we tried trazodone (not helpful) We did gene testing Prozac and Lamictal are favorable.   Psychiatric Specialty Exam: Physical Exam  ROS  There were no vitals taken for this visit.There is no height or weight on file to calculate BMI.  General Appearance: NA  Eye Contact:  NA   Speech:  Clear and Coherent and Normal Rate  Volume:  Normal  Mood:  Anxious and Irritable  Affect:  NA  Thought Process:  Descriptions of Associations: Intact  Orientation:  Full (Time, Place, and Person)  Thought Content:  Rumination  Suicidal Thoughts:  No  Homicidal Thoughts:  No  Memory:  Immediate;   Good Recent;   Good Remote;   Good  Judgement:  Good  Insight:  Good  Psychomotor Activity:  NA  Concentration:  Concentration: Fair and Attention Span: Fair  Recall:  Good  Fund of Knowledge:  Good  Language:  Good  Akathisia:  No  Handed:  Right  AIMS (if indicated):     Assets:  Communication Skills Desire for Improvement Housing Resilience  ADL's:  Intact  Cognition:  WNL  Sleep:   ok      Assessment and Plan: Major depressive disorder, recurrent.  Posttraumatic stress disorder.  Patient shown some improvement with the Seroquel.  Recommended to try Lamictal 100 mg at bedtime to help his paranoia, PTSD sleep.  Continue Lamictal 200 mg daily and hydroxyzine 25 mg 3 times a day.  Discussed medication side effects and benefits.  He has no rash or any itching.  Encourage healthy lifestyle.  Patient has lost more than 15 pounds since he is walking every day.  Recommended to call us back if is any question or any concern.  Follow-up in 2 months.  Follow Up Instructions:    I discussed the  assessment and treatment plan with the patient. The patient was provided an opportunity to ask questions and all were answered. The patient agreed with the plan and demonstrated an understanding of the instructions.   The patient was advised to call back or seek an in-person evaluation if the symptoms worsen or if the condition fails to improve as anticipated.  I provided 20 minutes of non-face-to-face time during this encounter.   Kathlee Nations, MD

## 2019-06-03 ENCOUNTER — Ambulatory Visit (INDEPENDENT_AMBULATORY_CARE_PROVIDER_SITE_OTHER): Payer: Self-pay

## 2019-06-03 ENCOUNTER — Other Ambulatory Visit: Payer: Self-pay

## 2019-06-03 DIAGNOSIS — Z23 Encounter for immunization: Secondary | ICD-10-CM

## 2019-06-03 NOTE — Progress Notes (Signed)
Pt here for influenza vaccine.  Screening questionnaire reviewed, VIS provided to patient, and any/all patient questions answered.  T. Nelson, CMA  

## 2019-06-12 ENCOUNTER — Encounter (HOSPITAL_COMMUNITY): Payer: Self-pay | Admitting: Psychiatry

## 2019-06-12 ENCOUNTER — Ambulatory Visit (INDEPENDENT_AMBULATORY_CARE_PROVIDER_SITE_OTHER): Payer: Self-pay | Admitting: Psychiatry

## 2019-06-12 ENCOUNTER — Other Ambulatory Visit: Payer: Self-pay

## 2019-06-12 DIAGNOSIS — F4310 Post-traumatic stress disorder, unspecified: Secondary | ICD-10-CM

## 2019-06-12 DIAGNOSIS — F332 Major depressive disorder, recurrent severe without psychotic features: Secondary | ICD-10-CM

## 2019-06-12 MED ORDER — QUETIAPINE FUMARATE 100 MG PO TABS: 100.0000 mg | ORAL_TABLET | Freq: Every day | ORAL | 2 refills | Status: DC

## 2019-06-12 MED ORDER — HYDROXYZINE PAMOATE 25 MG PO CAPS: 25.0000 mg | ORAL_CAPSULE | Freq: Four times a day (QID) | ORAL | 2 refills | Status: DC | PRN

## 2019-06-12 MED ORDER — LAMOTRIGINE 200 MG PO TABS: 200.0000 mg | ORAL_TABLET | Freq: Every day | ORAL | 2 refills | Status: DC

## 2019-06-12 NOTE — Progress Notes (Signed)
Virtual Visit via Telephone Note  I connected with Joneen Caraway on 06/12/19 at 10:00 AM EST by telephone and verified that I am speaking with the correct person using two identifiers.   I discussed the limitations, risks, security and privacy concerns of performing an evaluation and management service by telephone and the availability of in person appointments. I also discussed with the patient that there may be a patient responsible charge related to this service. The patient expressed understanding and agreed to proceed.   History of Present Illness: Patient was evaluated by phone session.  He is doing much better since Seroquel dose increased to 100.  He is sleeping better.  He lost another 10 pounds and is happy about it.  He admitted sometimes anxious and nervous because of his job which is very overwhelming but he is handling much better.  He takes hydroxyzine 25 mg up to 4 times a day which very helps his anxiety.  He is working as a Optometrist.  He had a quiet Thanksgiving.  Denies drinking or using any illegal substances.  He has no rash, itching tremors or shakes with the Lamictal.  His nightmares and flashbacks are subsided and occasionally he has those.  He like to keep his current medication.   Psychiatric Specialty Exam: Physical Exam  Review of Systems  There were no vitals taken for this visit.There is no height or weight on file to calculate BMI.  General Appearance: NA  Eye Contact:  NA  Speech:  Clear and Coherent and Normal Rate  Volume:  Normal  Mood:  Anxious  Affect:  NA  Thought Process:  Goal Directed  Orientation:  Full (Time, Place, and Person)  Thought Content:  Rumination  Suicidal Thoughts:  No  Homicidal Thoughts:  No  Memory:  Immediate;   Good Recent;   Good Remote;   Good  Judgement:  Intact  Insight:  Present  Psychomotor Activity:  NA  Concentration:  Concentration: Fair and Attention Span: Fair  Recall:  Good  Fund of Knowledge:  Good  Language:   Good  Akathisia:  No  Handed:  Right  AIMS (if indicated):     Assets:  Communication Skills Desire for Improvement Housing Resilience Social Support Talents/Skills Transportation  ADL's:  Intact  Cognition:  WNL  Sleep:   improved      Assessment and Plan: Major depressive disorder, recurrent.  Posttraumatic stress disorder.  Patient is stable on his current medication.  He has occasional nightmares but his sleep is improved.  He is not interested in therapy.  He is walking every day and he lost another 10 pounds.  Continue Seroquel 100 mg at bedtime, continue Lamictal 200 mg daily and hydroxyzine 25 mg 3-4 times a day.  Recommended to call us back if he has any question or any concern.  Follow-up in 3 months.    Follow Up Instructions:    I discussed the assessment and treatment plan with the patient. The patient was provided an opportunity to ask questions and all were answered. The patient agreed with the plan and demonstrated an understanding of the instructions.   The patient was advised to call back or seek an in-person evaluation if the symptoms worsen or if the condition fails to improve as anticipated.  I provided 20 minutes of non-face-to-face time during this encounter.   Kathlee Nations, MD

## 2019-06-27 HISTORY — PX: ESOPHAGOGASTRODUODENOSCOPY: SHX1529

## 2019-07-01 IMAGING — MR MR SHOULDER*L* W/O CM
4 of 5 series · 14 of 40 positions shown · non-contrast
Comparison: None.

CLINICAL DATA: Left shoulder dislocation. Pain radiates into the
left arm and left elbow.

EXAM:
MRI OF THE LEFT SHOULDER WITHOUT CONTRAST
TECHNIQUE: Multiplanar, multisequence MR imaging of the shoulder was performed.
No intravenous contrast was administered.

[Series 6: T2 fat-sat · axial · left · 3.0mm · 0.44mm/px · z∈[-48,+23]mm · 3 of 27 slices shown (1 of 3)]
[im 4/27]
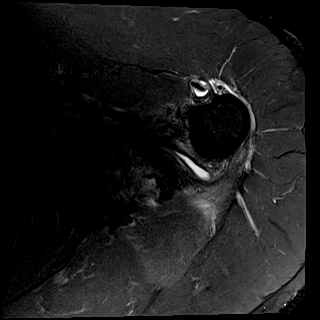
[im 14/27]
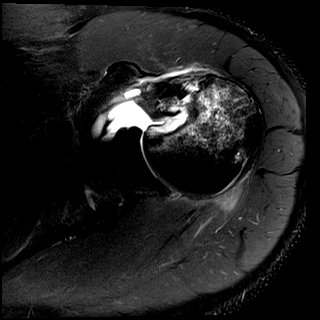
[im 23/27]
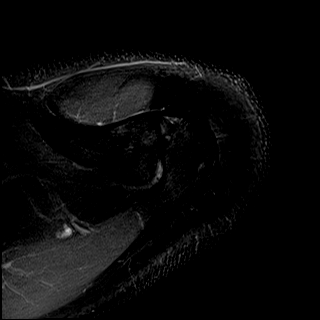

[Series 7: T2 fat-sat · oblique · left · 3.0mm · 0.44mm/px · 3 of 21 slices shown (2 of 3)]
[im 4/21]
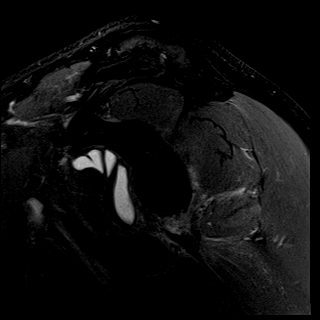
[im 11/21]
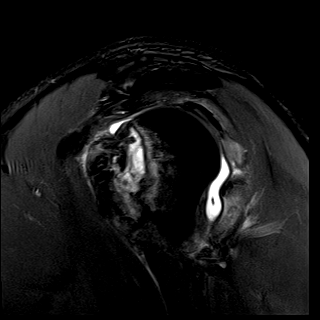
[im 17/21]
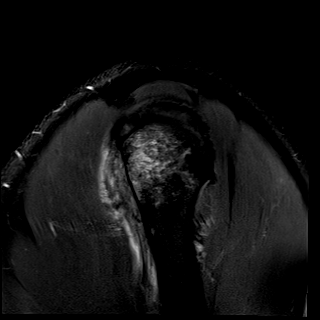

[Series 10: PD · oblique · left · 3.0mm · 0.18mm/px · 5 of 21 slices shown]
[im 1/21]
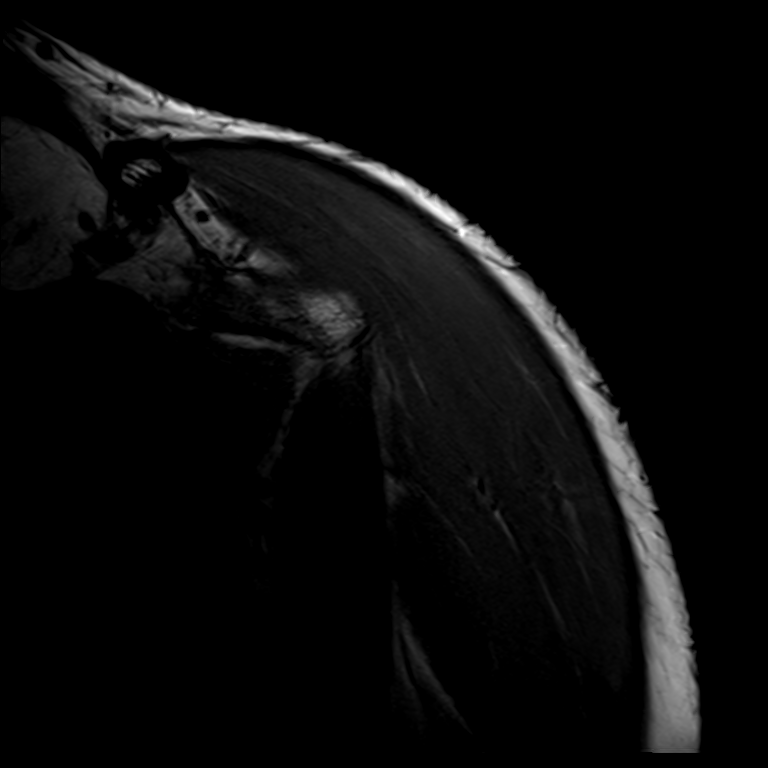
[im 3/21]
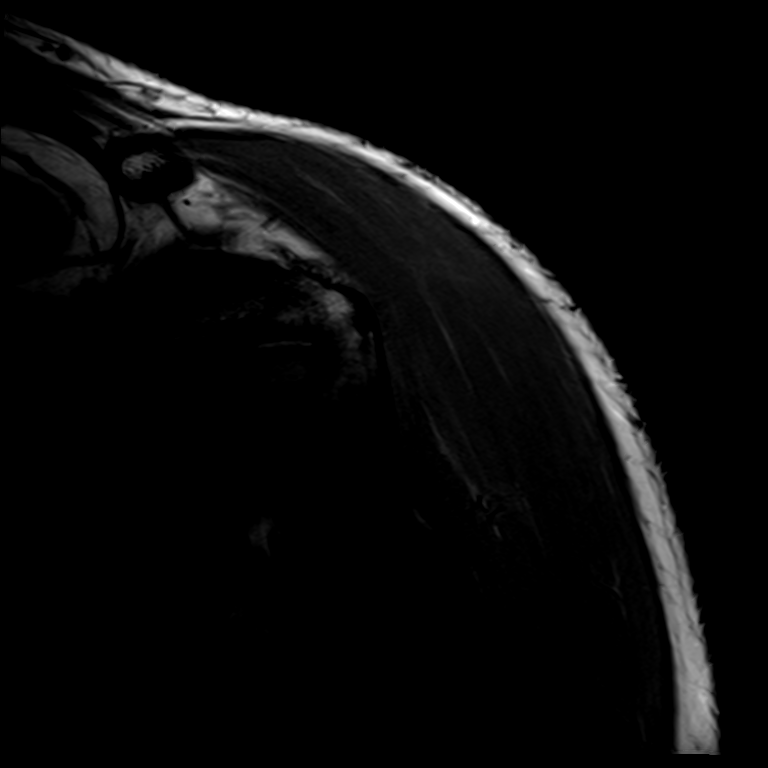
[im 6/21]
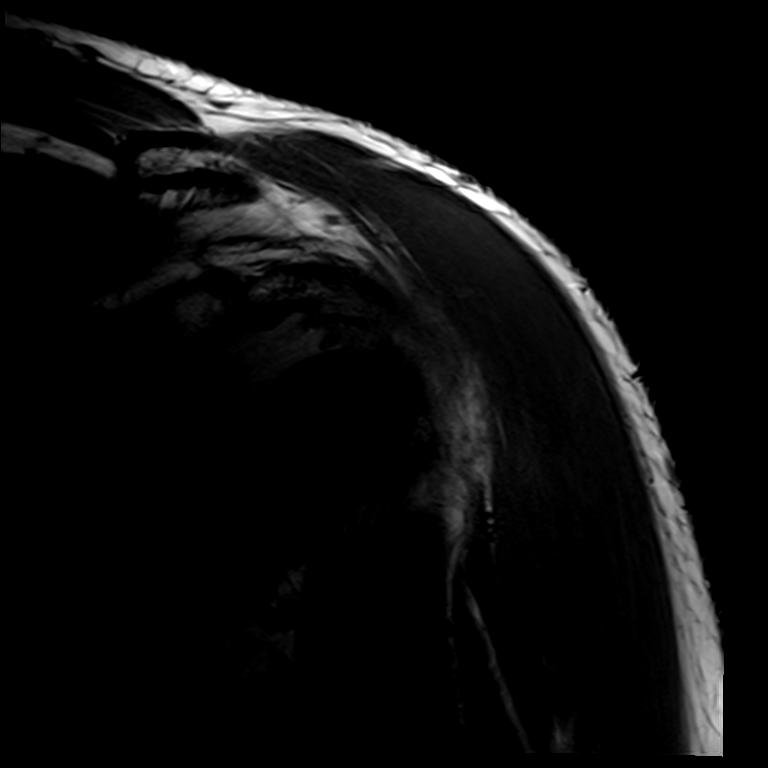
[im 12/21]
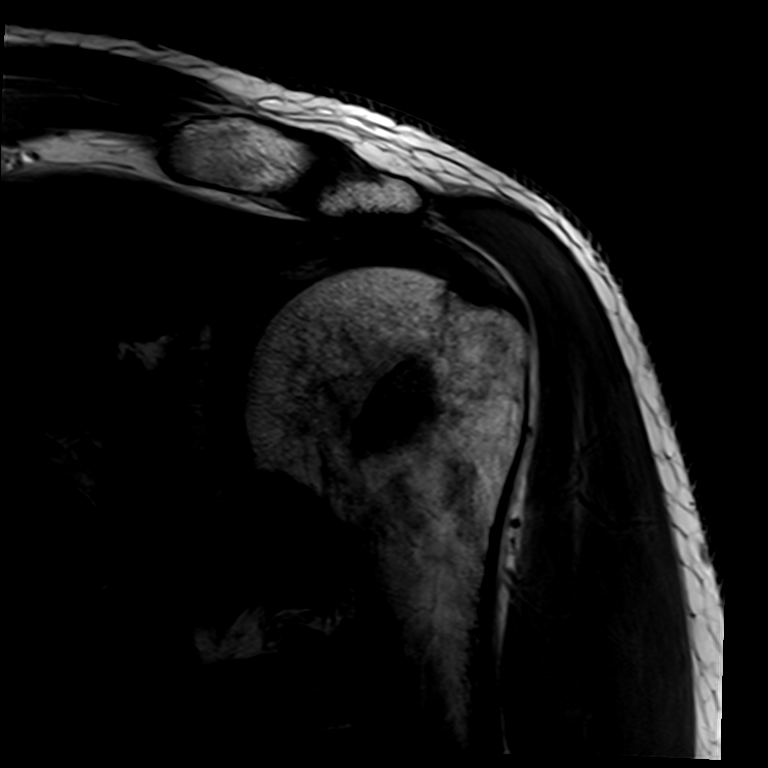
[im 18/21]
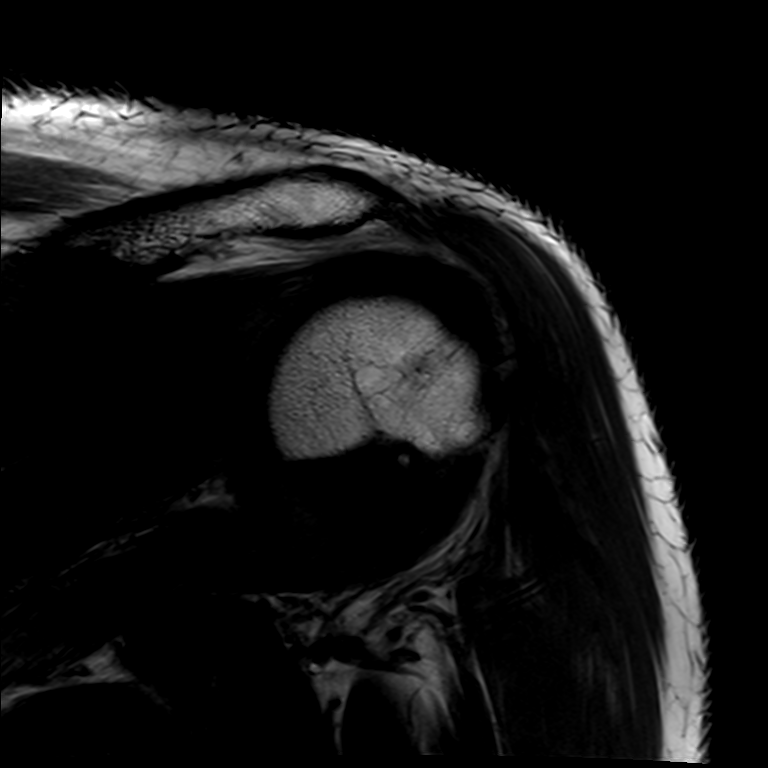

[Series 11: T2 fat-sat · oblique · left · 3.0mm · 0.22mm/px · 3 of 21 slices shown (3 of 3)]
[im 3/21]
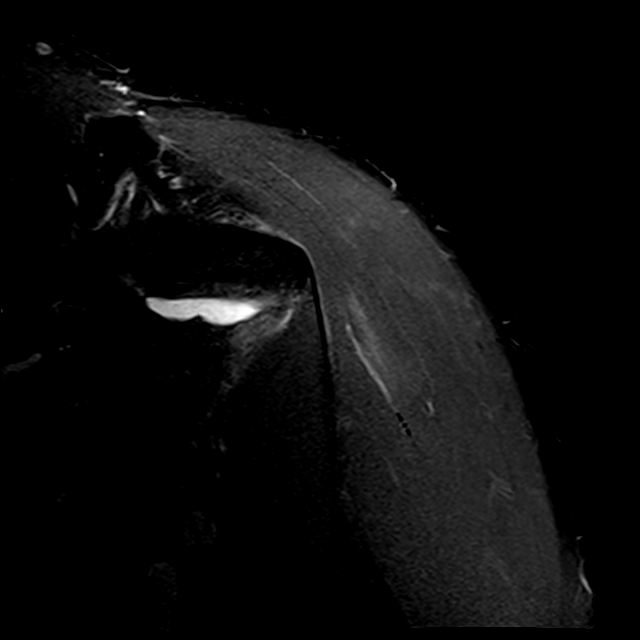
[im 12/21]
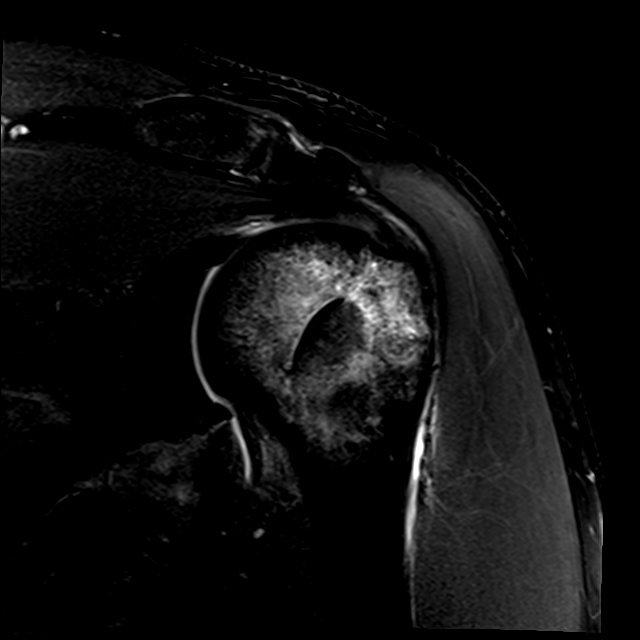
[im 18/21]
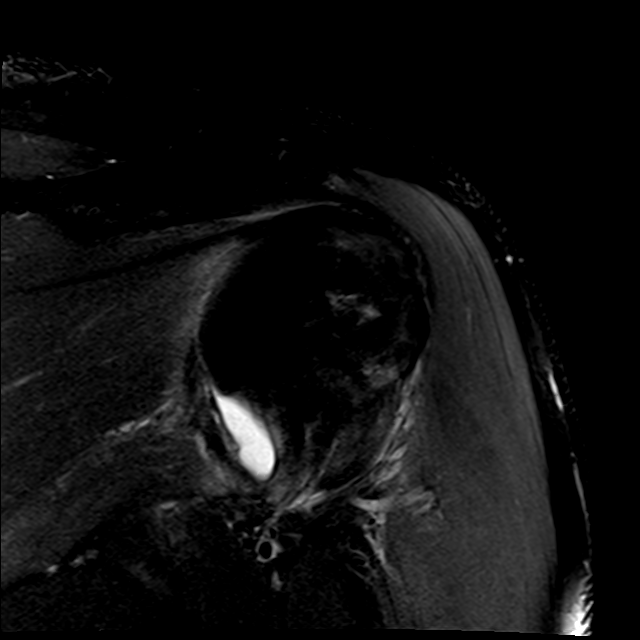

[14 of 40 positions shown; findings below may reference images not displayed]

FINDINGS: Rotator cuff: Mild tendinosis of the supraspinatus and infraspinatus
tendons. Teres minor tendon is intact. Mild tendinosis of the
subscapularis tendon.

Muscles: Muscle edema in the teres minor muscle likely reflecting
muscle strain.

Biceps long head: Mild tendinosis of the intraarticular portion of
the long head of the biceps tendon.

Acromioclavicular Joint: Mild arthropathy of the acromioclavicular
joint. Type II acromion. No subacromial/subdeltoid bursal fluid.

Glenohumeral Joint: Small joint effusion. No chondral defect.
Increased signal and thickening of the anterior band of the
glenohumeral ligament with partial discontinuity at the humeral
insertion most concerning for a high-grade partial-thickness tear.

Labrum:  Posterior labral tear.

Bones: Severe bone marrow edema in the a anterior half of the
humeral head with a comminuted and impacted fracture of the lesser
tuberosity.

Other: No fluid collection or hematoma.
IMPRESSION: 1. Comminuted and impacted fracture of the lesser tuberosity with
severe surrounding marrow edema. Posterior labral tear. Overall
findings are consistent with sequela of posterior shoulder
dislocation.
2. Increased signal and thickening of the anterior band of the
glenohumeral ligament with partial discontinuity at the humeral
insertion most concerning for a high-grade partial-thickness tear.
3. Mild muscle strain of the teres minor muscle.
4. Mild tendinosis of the supraspinatus, infraspinatus and
subscapularis tendons.

## 2019-09-10 ENCOUNTER — Other Ambulatory Visit: Payer: Self-pay

## 2019-09-10 ENCOUNTER — Ambulatory Visit (INDEPENDENT_AMBULATORY_CARE_PROVIDER_SITE_OTHER): Payer: Self-pay | Admitting: Psychiatry

## 2019-09-10 ENCOUNTER — Encounter (HOSPITAL_COMMUNITY): Payer: Self-pay | Admitting: Psychiatry

## 2019-09-10 VITALS — Wt 200.0 lb

## 2019-09-10 DIAGNOSIS — F419 Anxiety disorder, unspecified: Secondary | ICD-10-CM

## 2019-09-10 DIAGNOSIS — F332 Major depressive disorder, recurrent severe without psychotic features: Secondary | ICD-10-CM

## 2019-09-10 DIAGNOSIS — F4310 Post-traumatic stress disorder, unspecified: Secondary | ICD-10-CM

## 2019-09-10 MED ORDER — QUETIAPINE FUMARATE 100 MG PO TABS: 100.0000 mg | ORAL_TABLET | Freq: Every day | ORAL | 2 refills | Status: DC

## 2019-09-10 MED ORDER — HYDROXYZINE PAMOATE 25 MG PO CAPS: 25.0000 mg | ORAL_CAPSULE | Freq: Four times a day (QID) | ORAL | 2 refills | Status: DC | PRN

## 2019-09-10 MED ORDER — LAMOTRIGINE 200 MG PO TABS: 200.0000 mg | ORAL_TABLET | Freq: Every day | ORAL | 2 refills | Status: DC

## 2019-09-10 NOTE — Progress Notes (Signed)
Virtual Visit via Telephone Note  I connected with Harold Collins on 09/10/19 at  3:00 PM EDT by telephone and verified that I am speaking with the correct person using two identifiers.   I discussed the limitations, risks, security and privacy concerns of performing an evaluation and management service by telephone and the availability of in person appointments. I also discussed with the patient that there may be a patient responsible charge related to this service. The patient expressed understanding and agreed to proceed.   History of Present Illness: Patient was evaluated by phone session.  He is taking Seroquel, Lamictal and hydroxyzine.  He is sleeping better and his nightmares and flashbacks are almost resolved.  Recently he had injured his left knee and having difficulty walking.  He is supposed to work from home but so far his supervisor has not approved.  He cannot drive and hoping that his daughter will submit paperwork so we can work from home.  He is taking hydroxyzine 4 times a day which is helping his anxiety.  He is working as a Research scientist (medical).  He is pleased that his depression and mood is stable.  He is able to lost weight and his blood sugar is also getting better.  Lives with his wife who is very supportive and 67-year-old son.  Patient denies any crying spells or any feeling of hopelessness.  His energy level is good.  Past Psychiatric History:Reviewed. H/Odepression as a teenager. H/Ocut himself and overdosebutnoinpatient treatment.H/Osevere mood swing,night mares,anger,fights anddepression. While in [employer] spend 10 days jail due to assault charges. Tried Paxil,BuSpar, Lexapro, Prozac, Brintellix and Effexor. Recently we tried trazodone (not helpful)We did gene testing Prozac and Lamictal are favorable.   Psychiatric Specialty Exam: Physical Exam  Review of Systems  Musculoskeletal:       Left knee pain    Weight 200 lb (90.7 kg).There is no height or weight on file  to calculate BMI.  General Appearance: NA  Eye Contact:  NA  Speech:  Clear and Coherent and Slow  Volume:  Normal  Mood:  Euthymic  Affect:  NA  Thought Process:  Goal Directed  Orientation:  Full (Time, Place, and Person)  Thought Content:  Rumination  Suicidal Thoughts:  No  Homicidal Thoughts:  No  Memory:  Immediate;   Good Recent;   Good Remote;   Good  Judgement:  Intact  Insight:  Present  Psychomotor Activity:  NA  Concentration:  Concentration: Fair and Attention Span: Fair  Recall:  Good  Fund of Knowledge:  Good  Language:  Good  Akathisia:  No  Handed:  Right  AIMS (if indicated):     Assets:  Communication Skills Desire for Improvement Housing Resilience Social Support  ADL's:  Intact  Cognition:  WNL  Sleep:   good      Assessment and Plan: Major depressive disorder, recurrent.  PTSD.  Anxiety.  Patient is a stable on his current medication.  He does not want to change the doses since it is working well.  Continue Seroquel 100 mg at bedtime, Lamictal 200 mg daily and hydroxyzine 25 mg 3-4 times a day.  Discussed medication side effects and benefits.  He has no rash, itching, tremors or shakes.  He is not interested in therapy.  Recommended to call us back if he has any question or any concern.  Follow-up in 3 months.  Follow Up Instructions:    I discussed the assessment and treatment plan with the patient. The patient was  provided an opportunity to ask questions and all were answered. The patient agreed with the plan and demonstrated an understanding of the instructions.   The patient was advised to call back or seek an in-person evaluation if the symptoms worsen or if the condition fails to improve as anticipated.  I provided 20 minutes of non-face-to-face time during this encounter.   Kathlee Nations, MD

## 2019-11-04 ENCOUNTER — Other Ambulatory Visit: Payer: Self-pay | Admitting: Physician Assistant

## 2019-11-04 DIAGNOSIS — E1169 Type 2 diabetes mellitus with other specified complication: Secondary | ICD-10-CM

## 2019-11-04 DIAGNOSIS — Z79899 Other long term (current) drug therapy: Secondary | ICD-10-CM

## 2019-11-04 DIAGNOSIS — K22719 Barrett's esophagus with dysplasia, unspecified: Secondary | ICD-10-CM

## 2019-11-04 DIAGNOSIS — E781 Pure hyperglyceridemia: Secondary | ICD-10-CM

## 2019-11-04 MED ORDER — OMEPRAZOLE 40 MG PO CPDR: 40.0000 mg | DELAYED_RELEASE_CAPSULE | Freq: Every day | ORAL | 0 refills | Status: DC

## 2019-11-04 NOTE — Telephone Encounter (Signed)
#  30 day supply of Meds sent to pharmacy.   Future order for lipid panel and A1c ordered.   AS, CMA

## 2019-11-04 NOTE — Telephone Encounter (Signed)
Patient called to request refill on Omeprazole Rx---advised pt his LOV was 10/2018 & the AVS shows:   Return for 56mo-DM, HLD etc- Will NEED FLP, A1c prior to OV.  ---Forwarding request to med asst that appt scheduled for 11/18/19 but also that LABS are needed prior to OV(No labs orders in pt's chart & waiting to scheduled Lab Appt till orders are added.  Pt still ask for Rx refill on :  omeprazole (PRILOSEC) 40 MG capsule [[id number]]   Order Details Dose: 40 mg Route: Oral Frequency: Daily  Dispense Quantity: 90 capsule Refills: 3       Sig: Take 1 capsule (40 mg total) by mouth daily.        ---Forwarding request to med asst that if approved send refill order to  : [facility] [address] ([address] [place] - [address] [phone] (Phone) [phone] (Fax)   --glh

## 2019-11-18 ENCOUNTER — Other Ambulatory Visit: Payer: Self-pay

## 2019-11-18 ENCOUNTER — Ambulatory Visit (INDEPENDENT_AMBULATORY_CARE_PROVIDER_SITE_OTHER): Payer: Commercial Managed Care - PPO | Admitting: Physician Assistant

## 2019-11-18 ENCOUNTER — Encounter: Payer: Self-pay | Admitting: Physician Assistant

## 2019-11-18 VITALS — BP 105/67 | HR 86 | Temp 98.3°F | Ht 71.0 in | Wt 215.4 lb

## 2019-11-18 DIAGNOSIS — K2270 Barrett's esophagus without dysplasia: Secondary | ICD-10-CM

## 2019-11-18 DIAGNOSIS — B370 Candidal stomatitis: Secondary | ICD-10-CM

## 2019-11-18 DIAGNOSIS — F39 Unspecified mood [affective] disorder: Secondary | ICD-10-CM | POA: Diagnosis not present

## 2019-11-18 DIAGNOSIS — E781 Pure hyperglyceridemia: Secondary | ICD-10-CM | POA: Diagnosis not present

## 2019-11-18 DIAGNOSIS — E1169 Type 2 diabetes mellitus with other specified complication: Secondary | ICD-10-CM

## 2019-11-18 DIAGNOSIS — E119 Type 2 diabetes mellitus without complications: Secondary | ICD-10-CM | POA: Diagnosis not present

## 2019-11-18 DIAGNOSIS — E782 Mixed hyperlipidemia: Secondary | ICD-10-CM

## 2019-11-18 LAB — POCT GLYCOSYLATED HEMOGLOBIN (HGB A1C): Hemoglobin A1C: 5.4 % (ref 4.0–5.6)

## 2019-11-18 MED ORDER — FLUCONAZOLE 100 MG PO TABS: 100.0000 mg | ORAL_TABLET | Freq: Every day | ORAL | 0 refills | Status: DC

## 2019-11-18 NOTE — Patient Instructions (Addendum)
Oral Thrush, Adult  Oral thrush, also called oral candidiasis, is a fungal infection that develops in the mouth and throat and on the tongue. It causes white patches to form on the mouth and tongue. Thrush is most common in older adults, but it can occur at any age. Many cases of thrush are mild, but this infection can also be serious. Thrush can be a repeated (recurrent) problem for certain people who have a weak body defense system (immune system). The weakness can be caused by chronic illnesses, or by taking medicines that limit the body's ability to fight infection. If a person has difficulty fighting infection, the fungus that causes thrush can spread through the body. This can cause life-threatening blood or organ infections. What are the causes? This condition is caused by a fungus (yeast) called Candida albicans.  This fungus is normally present in small amounts in the mouth and on other mucous membranes. It usually causes no harm.  If conditions are present that allow the fungus to grow without control, it invades surrounding tissues and becomes an infection.  Other Candida species can also lead to thrush (rare). What increases the risk? This condition is more likely to develop in:  People with a weakened immune system.  Older adults.  People with HIV (human immunodeficiency virus).  People with diabetes.  People with dry mouth (xerostomia).  Pregnant women.  People with poor dental care, especially people who have false teeth.  People who use antibiotic medicines. What are the signs or symptoms? Symptoms of this condition can vary from mild and moderate to severe and persistent. Symptoms may include:  A burning feeling in the mouth and throat. This can occur at the start of a thrush infection.  White patches that stick to the mouth and tongue. The tissue around the patches may be red, raw, and painful. If rubbed (during tooth brushing, for example), the patches and the  tissue of the mouth may bleed easily.  A bad taste in the mouth or difficulty tasting foods.  A cottony feeling in the mouth.  Pain during eating and swallowing.  Poor appetite.  Cracking at the corners of the mouth. How is this diagnosed? This condition is diagnosed based on:  Physical exam. Your health care provider will look in your mouth.  Health history. Your health care provider will ask you questions about your health. How is this treated? This condition is treated with medicines called antifungals, which prevent the growth of fungi. These medicines are either applied directly to the affected area (topical) or swallowed (oral). The treatment will depend on the severity of the condition. Mild thrush Mild cases of thrush may clear up with the use of an antifungal mouth rinse or lozenges. Treatment usually lasts about 14 days. Moderate to severe thrush  More severe thrush infections that have spread to the esophagus are treated with an oral antifungal medicine. A topical antifungal medicine may also be used.  For some severe infections, treatment may need to continue for more than 14 days.  Oral antifungal medicines are rarely used during pregnancy because they may be harmful to the unborn child. If you are pregnant, talk with your health care provider about options for treatment. Persistent or recurrent thrush For cases of thrush that do not go away or keep coming back:  Treatment may be needed twice as long as the symptoms last.  Treatment will include both oral and topical antifungal medicines.  People with a weakened immune system can take   an antifungal medicine on a continuous basis to prevent thrush infections. It is important to treat conditions that make a person more likely to get thrush, such as diabetes or HIV. Follow these instructions at home: Medicines  Take over-the-counter and prescription medicines only as told by your health care provider.  Talk with  your health care provider about an over-the-counter medicine called gentian violet, which kills bacteria and fungi. Relieving soreness and discomfort To help reduce the discomfort of thrush:  Drink cold liquids such as water or iced tea.  Try flavored ice treats or frozen juices.  Eat foods that are easy to swallow, such as gelatin, ice cream, or custard.  Try drinking from a straw if the patches in your mouth are painful.  General instructions  Eat plain, unflavored yogurt as directed by your health care provider. Check the label to make sure the yogurt contains live cultures. This yogurt can help healthy bacteria to grow in the mouth and can stop the growth of the fungus that causes thrush.  If you wear dentures, remove the dentures before going to bed, brush them vigorously, and soak them in a cleaning solution as directed by your health care provider.  Rinse your mouth with a warm salt-water mixture several times a day. To make a salt-water mixture, completely dissolve 1/2-1 tsp of salt in 1 cup of warm water. Contact a health care provider if:  Your symptoms are getting worse or are not improving within 7 days of starting treatment.  You have symptoms of a spreading infection, such as white patches on the skin outside of the mouth. This information is not intended to replace advice given to you by your health care provider. Make sure you discuss any questions you have with your health care provider. Document Revised: 09/14/2017 Document Reviewed: 03/06/2016 Elsevier Patient Education  2020 [employer].   Diabetes Mellitus and Nutrition, Adult When you have diabetes (diabetes mellitus), it is very important to have healthy eating habits because your blood sugar (glucose) levels are greatly affected by what you eat and drink. Eating healthy foods in the appropriate amounts, at about the same times every day, can help you:  Control your blood glucose.  Lower your risk of heart  disease.  Improve your blood pressure.  Reach or maintain a healthy weight. Every person with diabetes is different, and each person has different needs for a meal plan. Your health care provider may recommend that you work with a diet and nutrition specialist (dietitian) to make a meal plan that is best for you. Your meal plan may vary depending on factors such as:  The calories you need.  The medicines you take.  Your weight.  Your blood glucose, blood pressure, and cholesterol levels.  Your activity level.  Other health conditions you have, such as heart or kidney disease. How do carbohydrates affect me? Carbohydrates, also called carbs, affect your blood glucose level more than any other type of food. Eating carbs naturally raises the amount of glucose in your blood. Carb counting is a method for keeping track of how many carbs you eat. Counting carbs is important to keep your blood glucose at a healthy level, especially if you use insulin or take certain oral diabetes medicines. It is important to know how many carbs you can safely have in each meal. This is different for every person. Your dietitian can help you calculate how many carbs you should have at each meal and for each snack. Foods that  contain carbs include:  Bread, cereal, rice, pasta, and crackers.  Potatoes and corn.  Peas, beans, and lentils.  Milk and yogurt.  Fruit and juice.  Desserts, such as cakes, cookies, ice cream, and candy. How does alcohol affect me? Alcohol can cause a sudden decrease in blood glucose (hypoglycemia), especially if you use insulin or take certain oral diabetes medicines. Hypoglycemia can be a life-threatening condition. Symptoms of hypoglycemia (sleepiness, dizziness, and confusion) are similar to symptoms of having too much alcohol. If your health care provider says that alcohol is safe for you, follow these guidelines:  Limit alcohol intake to no more than 1 drink per day for  nonpregnant women and 2 drinks per day for men. One drink equals 12 oz of beer, 5 oz of wine, or 1 oz of hard liquor.  Do not drink on an empty stomach.  Keep yourself hydrated with water, diet soda, or unsweetened iced tea.  Keep in mind that regular soda, juice, and other mixers may contain a lot of sugar and must be counted as carbs. What are tips for following this plan?  Reading food labels  Start by checking the serving size on the "Nutrition Facts" label of packaged foods and drinks. The amount of calories, carbs, fats, and other nutrients listed on the label is based on one serving of the item. Many items contain more than one serving per package.  Check the total grams (g) of carbs in one serving. You can calculate the number of servings of carbs in one serving by dividing the total carbs by 15. For example, if a food has 30 g of total carbs, it would be equal to 2 servings of carbs.  Check the number of grams (g) of saturated and trans fats in one serving. Choose foods that have low or no amount of these fats.  Check the number of milligrams (mg) of salt (sodium) in one serving. Most people should limit total sodium intake to less than 2,300 mg per day.  Always check the nutrition information of foods labeled as "low-fat" or "nonfat". These foods may be higher in added sugar or refined carbs and should be avoided.  Talk to your dietitian to identify your daily goals for nutrients listed on the label. Shopping  Avoid buying canned, premade, or processed foods. These foods tend to be high in fat, sodium, and added sugar.  Shop around the outside edge of the grocery store. This includes fresh fruits and vegetables, bulk grains, fresh meats, and fresh dairy. Cooking  Use low-heat cooking methods, such as baking, instead of high-heat cooking methods like deep frying.  Cook using healthy oils, such as olive, canola, or sunflower oil.  Avoid cooking with butter, cream, or  high-fat meats. Meal planning  Eat meals and snacks regularly, preferably at the same times every day. Avoid going long periods of time without eating.  Eat foods high in fiber, such as fresh fruits, vegetables, beans, and whole grains. Talk to your dietitian about how many servings of carbs you can eat at each meal.  Eat 4-6 ounces (oz) of lean protein each day, such as lean meat, chicken, fish, eggs, or tofu. One oz of lean protein is equal to: ? 1 oz of meat, chicken, or fish. ? 1 egg. ?  cup of tofu.  Eat some foods each day that contain healthy fats, such as avocado, nuts, seeds, and fish. Lifestyle  Check your blood glucose regularly.  Exercise regularly as told by your health  care provider. This may include: ? 150 minutes of moderate-intensity or vigorous-intensity exercise each week. This could be brisk walking, biking, or water aerobics. ? Stretching and doing strength exercises, such as yoga or weightlifting, at least 2 times a week.  Take medicines as told by your health care provider.  Do not use any products that contain nicotine or tobacco, such as cigarettes and e-cigarettes. If you need help quitting, ask your health care provider.  Work with a Veterinary surgeon or diabetes educator to identify strategies to manage stress and any emotional and social challenges. Questions to ask a health care provider  Do I need to meet with a diabetes educator?  Do I need to meet with a dietitian?  What number can I call if I have questions?  When are the best times to check my blood glucose? Where to find more information:  American Diabetes Association: diabetes.org  Academy of Nutrition and Dietetics: www.eatright.[employer] of Diabetes and Digestive and Kidney Diseases (NIH): CarF[employer].tn Summary  A healthy meal plan will help you control your blood glucose and maintain a healthy lifestyle.  Working with a diet and nutrition specialist (dietitian) can help  you make a meal plan that is best for you.  Keep in mind that carbohydrates (carbs) and alcohol have immediate effects on your blood glucose levels. It is important to count carbs and to use alcohol carefully. This information is not intended to replace advice given to you by your health care provider. Make sure you discuss any questions you have with your health care provider. Document Revised: 05/25/2017 Document Reviewed: 07/17/2016 Elsevier Patient Education  2020 [employer].

## 2019-11-18 NOTE — Progress Notes (Signed)
 Established Patient Office Visit  Subjective:  Patient ID: Harold Collins, male    DOB: 06/14/1981  Age: 39 y.o. MRN: [id number]  CC:  Chief Complaint  Patient presents with  . Diabetes  . Hyperlipidemia    HPI Harold Collins presents for follow up on T2DM, hyperlipidemia and mood disorder.  Diabetes: Pt denies increased urination or thirst. Pt reports discontinuing his medications. No hypoglycemic events. Not checking glucose at home like he used to. States he started a new job that requires lots of walking and has reduced junk food. He has lost weight.    HLD: Pt not taking medications. Reports history of hypertriglyceridemia and medications he has been in the past- Niacin and Lovaza only reduce his triglycerides a small amount. States his numbers usually stay in 500s or higher.   Mood: stable, followed by Dr. Arfeen.  Barrett's esophagus: Pt was referred to [facility] by Dr. Opalski and is requesting new referral since he wasn't able to schedule his EGD due to insurance purposes.   White lesions on tongue: Pt reports he has been trying to scrape off white spot on his tongue, but it keeps coming back and doesn't seem to go away.      Past Medical History:  Diagnosis Date  . Anxiety   . Barrett's esophagus   . Depression   . GERD (gastroesophageal reflux disease)   . Major depressive disorder   . PTSD (post-traumatic stress disorder)     Past Surgical History:  Procedure Laterality Date  . MENISCUS REPAIR Left   . SHOULDER SURGERY    . WRIST SURGERY      Family History  Problem Relation Age of Onset  . Depression Mother   . Diabetes Mother   . Anxiety disorder Mother   . Physical abuse Mother   . Cancer Father        gsophageal  . Depression Father   . Anxiety disorder Father   . Physical abuse Father   . Depression Sister   . Anxiety disorder Sister   . Physical abuse Sister   . Depression Brother   . ADD / ADHD Brother   . Anxiety disorder Brother     Social  History   Socioeconomic History  . Marital status: Married    Spouse name: Not on file  . Number of children: 2  . Years of education: Not on file  . Highest education level: High school graduate  Occupational History  . Not on file  Tobacco Use  . Smoking status: Former Smoker    Years: 3.00    Quit date: 05/08/2016    Years since quitting: 3.5  . Smokeless tobacco: Never Used  Substance and Sexual Activity  . Alcohol use: No  . Drug use: No  . Sexual activity: Yes    Partners: Female    Birth control/protection: None  Other Topics Concern  . Not on file  Social History Narrative  . Not on file   Social Determinants of Health   Financial Resource Strain:   . Difficulty of Paying Living Expenses:   Food Insecurity:   . Worried About Running Out of Food in the Last Year:   . Ran Out of Food in the Last Year:   Transportation Needs:   . Lack of Transportation (Medical):   . Lack of Transportation (Non-Medical):   Physical Activity:   . Days of Exercise per Week:   . Minutes of Exercise per Session:   Stress:   .   Feeling of Stress :   Social Connections:   . Frequency of Communication with Friends and Family:   . Frequency of Social Gatherings with Friends and Family:   . Attends Religious Services:   . Active Member of Clubs or Organizations:   . Attends Club or Organization Meetings:   . Marital Status:   Intimate Partner Violence:   . Fear of Current or Ex-Partner:   . Emotionally Abused:   . Physically Abused:   . Sexually Abused:     Outpatient Medications Prior to Visit  Medication Sig Dispense Refill  . blood glucose meter kit and supplies Dispense based on patient and insurance preference. Use to check glucose level fasting in the morning and 2 hours after largest meal. (FOR ICD-10 E10.9, E11.9). 1 each 0  . Cholecalciferol 25 MCG (1000 UT) capsule Vitamin D3 25 mcg (1,000 unit) capsule  Take by oral route.    . hydrOXYzine (VISTARIL) 25 MG capsule  Take 1 capsule (25 mg total) by mouth every 6 (six) hours as needed for anxiety. 120 capsule 2  . lamoTRIgine (LAMICTAL) 200 MG tablet Take 1 tablet (200 mg total) by mouth daily. 30 tablet 2  . loratadine-pseudoephedrine (CLARITIN-D 24-HOUR) 10-240 MG 24 hr tablet Take 1 tablet by mouth daily.    . omeprazole (PRILOSEC) 40 MG capsule Take 1 capsule (40 mg total) by mouth daily. **PATIENT NEEDS APT FOR FURTHER REFILLS** 30 capsule 0  . QUEtiapine (SEROQUEL) 100 MG tablet Take 1 tablet (100 mg total) by mouth at bedtime. 30 tablet 2  . Cholecalciferol (VITAMIN D-1000 MAX ST PO) Take 1,000 mg by mouth 2 (two) times daily.    . glipiZIDE (GLUCOTROL XL) 5 MG 24 hr tablet Take 1 tablet (5 mg total) by mouth daily with breakfast. 90 tablet 1  . omega-3 acid ethyl esters (LOVAZA) 1 g capsule Take 2 capsules (2 g total) by mouth 2 (two) times daily. 180 capsule 3  . simvastatin (ZOCOR) 20 MG tablet Take 1 tablet (20 mg total) by mouth at bedtime. 90 tablet 0   No facility-administered medications prior to visit.    No Known Allergies  ROS Review of Systems  A fourteen system review of systems was performed and found to be positive as per HPI.    Objective:    Physical Exam General: Well nourished, in no apparent distress. Eyes: PERRLA, EOMs, conjunctiva clr Resp: Respiratory effort- normal, ECTA B/L w/o W/R/R  Cardio: RRR w/o MRGs. Abdomen: no gross distention. Lymphatics:  less 2 sec cap RF M-sk: Full ROM, 5/5 strength, normal gait.  Skin: Warm, dry. Thick, white patch on tongue- scraped off with tongue depressor and small area with yellow-brown discoloration with hypertrophied papillae (former smoker).  Neuro: Alert, Oriented Psych: Normal affect, Insight and Judgment appropriate. Euthymic mood.  BP 105/67   Pulse 86   Temp 98.3 F (36.8 C) (Oral)   Ht 5' 11" (1.803 m)   Wt 215 lb 6.4 oz (97.7 kg)   SpO2 98% Comment: on RA  BMI 30.04 kg/m  Wt Readings from Last 3 Encounters:   11/18/19 215 lb 6.4 oz (97.7 kg)  11/21/18 230 lb (104.3 kg)  10/08/18 230 lb (104.3 kg)     Health Maintenance Due  Topic Date Due  . OPHTHALMOLOGY EXAM  Never done  . COVID-19 Vaccine (1) Never done  . URINE MICROALBUMIN  08/26/2019    There are no preventive care reminders to display for this patient.  Lab Results    Component Value Date   TSH 2.970 07/25/2017   Lab Results  Component Value Date   WBC 6.2 12/12/2017   HGB 16.2 12/12/2017   HCT 47.6 12/12/2017   MCV 90 12/12/2017   PLT 467 (H) 12/12/2017   Lab Results  Component Value Date   NA 140 11/07/2018   K 5.1 11/07/2018   CO2 24 11/07/2018   GLUCOSE 121 (H) 11/07/2018   BUN 12 11/07/2018   CREATININE 1.21 11/07/2018   BILITOT 0.4 11/07/2018   ALKPHOS 85 11/07/2018   AST 20 11/07/2018   ALT 35 11/07/2018   PROT 7.2 11/07/2018   ALBUMIN 4.9 11/07/2018   CALCIUM 9.9 11/07/2018   ANIONGAP 8 06/23/2017   Lab Results  Component Value Date   CHOL 210 (H) 08/26/2018   Lab Results  Component Value Date   HDL 31 (L) 08/26/2018   Lab Results  Component Value Date   LDLCALC 138 (H) 08/26/2018   Lab Results  Component Value Date   TRIG 207 (H) 08/26/2018   Lab Results  Component Value Date   CHOLHDL 6.8 (H) 08/26/2018   Lab Results  Component Value Date   HGBA1C 5.4 11/18/2019      Assessment & Plan:   Problem List Items Addressed This Visit      Digestive   Barrett's esophagus without dysplasia   Relevant Orders   Ambulatory referral to Gastroenterology     Endocrine   Mixed diabetic hyperlipidemia associated with type 2 diabetes mellitus ([place]) (Chronic)   Diabetes mellitus ([place]) - Primary   Relevant Orders   POCT glycosylated hemoglobin (Hb A1C) (Completed)     Other   Hypertriglyceridemia (Chronic)   Mood disorder (H[facility])    Other Visit Diagnoses    Oral candidiasis       Relevant Medications   fluconazole (DIFLUCAN) 100 MG tablet     Type 2 Diabetes Mellitus: - A1c today  is 5.4, at goal - Pt had discontinued Glipizide and A1c at goal so will not restart medication. - Per last weight, pt has lost 15 pounds. - Encourage to continue ambulatory glucose monitoring, especially when not feeling well.  - Encourage to follow low glucose/carbohydrate diet and stay as active as possible.  HLD: - Last lipid panel : total cholesterol, triglycerides, and bad cholesterol elevated. - Pt not fasting today so advised to return for FBW to repeat lipid panel in 2-3 weeks.  - Follow heart healthy diet.  Mood disorder: - PHQ9 score of 16, pt states he is recuperating from knee injury which has decreased his physical activity level and that has been challenging. - Continue follow-up with Dr. Adele Schilder.  Barrett's esophagus:  - Placed referral to gastroenterology.   Oral candidiasis:  - Pt has signs that are suggestive of oral candidiasis so will start Fluconazole.    Meds ordered this encounter  Medications  . fluconazole (DIFLUCAN) 100 MG tablet    Sig: Take 1 tablet (100 mg total) by mouth daily.    Dispense:  7 tablet    Refill:  0    Order Specific Question:   Supervising Provider    Answer:   Beatrice Lecher D [[id number]]    Follow-up: Return in about 4 months (around 03/20/2020) for DM, HLD, Mood; FBW (lipid, cmp) & microalbumin in 2-3 weeks.    Lorrene Reid, PA-C

## 2019-11-19 ENCOUNTER — Encounter: Payer: Self-pay | Admitting: Gastroenterology

## 2019-11-19 ENCOUNTER — Telehealth: Payer: Self-pay | Admitting: *Deleted

## 2019-11-19 NOTE — Telephone Encounter (Signed)
Thanks Baxter Hire, chart reviewed.  As long as he has not had any significant change to his medical history since have last seen him, which I do not think he has per review of the record, I think okay to direct book for EGD for history of Barrett's.  I think he stable to have this done at the [facility].  It looks like he could not proceed previously due to insurance issues.

## 2019-11-19 NOTE — Telephone Encounter (Signed)
noted 

## 2019-11-19 NOTE — Telephone Encounter (Signed)
Dr. Adela Lank,  This pt was seen in the office 10-08-18.  He had to reschedule his EGD a couple of times.  Per Dr. Myrtie Neither, we are to check with you if the OV and procedure are greater than 90 days apart.  Are you ok to proceed with direct EGD or would you like a repeat OV?  Thanks, [employer]

## 2019-11-28 ENCOUNTER — Ambulatory Visit (AMBULATORY_SURGERY_CENTER): Payer: Self-pay

## 2019-11-28 ENCOUNTER — Other Ambulatory Visit: Payer: Self-pay

## 2019-11-28 VITALS — Ht 71.0 in | Wt 218.0 lb

## 2019-11-28 DIAGNOSIS — K22719 Barrett's esophagus with dysplasia, unspecified: Secondary | ICD-10-CM

## 2019-11-28 NOTE — Progress Notes (Signed)
No egg or soy allergy known to patient  No issues with past sedation with any surgeries  or procedures, no intubation problems  No diet pills per patient No home 02 use per patient  No blood thinners per patient  Pt denies issues with constipation  No A fib or A flutter  EMMI video sent to pt's e mail   Pt has had J&J vaccine 11/21/19  Due to the COVID-19 pandemic we are asking patients to follow these guidelines. Please only bring one care partner. Please be aware that your care partner may wait in the car in the parking lot or if they feel like they will be too hot to wait in the car, they may wait in the lobby on the 4th floor. All care partners are required to wear a mask the entire time (we do not have any that we can provide them), they need to practice social distancing, and we will do a Covid check for all patient's and care partners when you arrive. Also we will check their temperature and your temperature. If the care partner waits in their car they need to stay in the parking lot the entire time and we will call them on their cell phone when the patient is ready for discharge so they can bring the car to the front of the building. Also all patient's will need to wear a mask into building.

## 2019-12-03 ENCOUNTER — Other Ambulatory Visit: Payer: Commercial Managed Care - PPO

## 2019-12-03 ENCOUNTER — Encounter: Payer: Self-pay | Admitting: Gastroenterology

## 2019-12-03 ENCOUNTER — Other Ambulatory Visit: Payer: Self-pay

## 2019-12-03 DIAGNOSIS — Z79899 Other long term (current) drug therapy: Secondary | ICD-10-CM

## 2019-12-03 DIAGNOSIS — E781 Pure hyperglyceridemia: Secondary | ICD-10-CM

## 2019-12-03 DIAGNOSIS — E1169 Type 2 diabetes mellitus with other specified complication: Secondary | ICD-10-CM

## 2019-12-03 NOTE — Addendum Note (Signed)
Addended by: Sylvester Harder on: 12/03/2019 08:55 AM   Modules accepted: Orders

## 2019-12-04 LAB — LIPID PANEL
Chol/HDL Ratio: 9.4 ratio — ABNORMAL HIGH (ref 0.0–5.0)
Cholesterol, Total: 226 mg/dL — ABNORMAL HIGH (ref 100–199)
HDL: 24 mg/dL — ABNORMAL LOW (ref >39)
Triglycerides: 825 mg/dL (ref 0–149)

## 2019-12-04 LAB — COMPREHENSIVE METABOLIC PANEL
ALT: 21 IU/L (ref 0–44)
AST: 16 IU/L (ref 0–40)
Albumin/Globulin Ratio: 1.7 (ref 1.2–2.2)
Albumin: 4.3 g/dL (ref 4.0–5.0)
Alkaline Phosphatase: 77 IU/L (ref 48–121)
BUN/Creatinine Ratio: 12 (ref 9–20)
BUN: 12 mg/dL (ref 6–20)
Bilirubin Total: 0.4 mg/dL (ref 0.0–1.2)
CO2: 23 mmol/L (ref 20–29)
Calcium: 9.5 mg/dL (ref 8.7–10.2)
Chloride: 105 mmol/L (ref 96–106)
Creatinine, Ser: 1.01 mg/dL (ref 0.76–1.27)
GFR calc Af Amer: 109 mL/min/{1.73_m2} (ref >59)
GFR calc non Af Amer: 95 mL/min/{1.73_m2} (ref >59)
Globulin, Total: 2.5 g/dL (ref 1.5–4.5)
Glucose: 103 mg/dL — ABNORMAL HIGH (ref 65–99)
Potassium: 4.7 mmol/L (ref 3.5–5.2)
Sodium: 143 mmol/L (ref 134–144)
Total Protein: 6.8 g/dL (ref 6.0–8.5)

## 2019-12-04 LAB — HEMOGLOBIN A1C
Est. average glucose Bld gHb Est-mCnc: 111 mg/dL
Hgb A1c MFr Bld: 5.5 % (ref 4.8–5.6)

## 2019-12-04 LAB — MICROALBUMIN / CREATININE URINE RATIO
Creatinine, Urine: 213.4 mg/dL
Microalb/Creat Ratio: 1 mg/g creat (ref 0–29)
Microalbumin, Urine: 3.2 ug/mL

## 2019-12-08 ENCOUNTER — Telehealth: Payer: Self-pay | Admitting: Physician Assistant

## 2019-12-08 NOTE — Telephone Encounter (Signed)
-----   Message from Mayer Masker, [place] sent at 12/05/2019  3:30 PM EDT ----- Harold Collins,  Please call [facility] and see if you can add a direct LDL. DX- E11.69, E78.1  Thank you, Kandis Cocking

## 2019-12-09 ENCOUNTER — Encounter: Payer: Self-pay | Admitting: Gastroenterology

## 2019-12-09 ENCOUNTER — Other Ambulatory Visit: Payer: Self-pay

## 2019-12-09 ENCOUNTER — Telehealth: Payer: Self-pay | Admitting: Physician Assistant

## 2019-12-09 ENCOUNTER — Ambulatory Visit (AMBULATORY_SURGERY_CENTER): Payer: Commercial Managed Care - PPO | Admitting: Gastroenterology

## 2019-12-09 VITALS — BP 103/69 | HR 77 | Temp 98.6°F | Resp 15 | Ht 71.0 in | Wt 218.0 lb

## 2019-12-09 DIAGNOSIS — K22719 Barrett's esophagus with dysplasia, unspecified: Secondary | ICD-10-CM

## 2019-12-09 DIAGNOSIS — E1169 Type 2 diabetes mellitus with other specified complication: Secondary | ICD-10-CM

## 2019-12-09 DIAGNOSIS — K449 Diaphragmatic hernia without obstruction or gangrene: Secondary | ICD-10-CM

## 2019-12-09 DIAGNOSIS — K219 Gastro-esophageal reflux disease without esophagitis: Secondary | ICD-10-CM | POA: Diagnosis not present

## 2019-12-09 DIAGNOSIS — E782 Mixed hyperlipidemia: Secondary | ICD-10-CM

## 2019-12-09 MED ORDER — OMEGA-3-ACID ETHYL ESTERS 1 G PO CAPS: 1.0000 g | ORAL_CAPSULE | Freq: Every day | ORAL | 0 refills | Status: DC

## 2019-12-09 MED ORDER — ROSUVASTATIN CALCIUM 20 MG PO TABS
20.0000 mg | ORAL_TABLET | Freq: Every day | ORAL | 0 refills | Status: DC
Start: 2019-12-09 — End: 2020-04-12

## 2019-12-09 MED ORDER — OMEPRAZOLE 40 MG PO CPDR: 40.0000 mg | DELAYED_RELEASE_CAPSULE | Freq: Every day | ORAL | 3 refills | Status: DC

## 2019-12-09 MED ORDER — SODIUM CHLORIDE 0.9 % IV SOLN: 500.0000 mL | Freq: Once | INTRAVENOUS | Status: DC

## 2019-12-09 NOTE — Progress Notes (Signed)
Called to room to assist during endoscopic procedure.  Patient ID and intended procedure confirmed with present staff. Received instructions for my participation in the procedure from the performing physician.  

## 2019-12-09 NOTE — Progress Notes (Signed)
Vitals-CW  Pt's states no medical or surgical changes since previsit or office visit. 

## 2019-12-09 NOTE — Op Note (Addendum)
[place] Patient Name: Harold Collins Procedure Date: 12/09/2019 10:55 AM MRN: [id number] Endoscopist: Remo Lipps P. Havery Moros , MD Age: 39 Referring MD:  Date of Birth: Nov 18, 1980 Gender: Male Account #: [id number] Procedure:                Upper GI endoscopy Indications:              history of Barrett's esophagus with dysplasia s/p                            ablative therapy at [facility] (Dr. Adria Devon), last done in                            2016, on omeprazole 40mg  / day with good control of                            reflux symptoms for the most part Medicines:                Monitored Anesthesia Care Procedure:                Pre-Anesthesia Assessment:                           - Prior to the procedure, a History and Physical                            was performed, and patient medications and                            allergies were reviewed. The patient's tolerance of                            previous anesthesia was also reviewed. The risks                            and benefits of the procedure and the sedation                            options and risks were discussed with the patient.                            All questions were answered, and informed consent                            was obtained. Prior Anticoagulants: The patient has                            taken no previous anticoagulant or antiplatelet                            agents. ASA Grade Assessment: II - A patient with                            mild systemic disease. After reviewing the risks  and benefits, the patient was deemed in                            satisfactory condition to undergo the procedure.                           After obtaining informed consent, the endoscope was                            passed under direct vision. Throughout the                            procedure, the patient's blood pressure, pulse, and                            oxygen  saturations were monitored continuously. The                            Endoscope was introduced through the mouth, and                            advanced to the second part of duodenum. The upper                            GI endoscopy was accomplished without difficulty.                            The patient tolerated the procedure well. Scope In: Scope Out: Findings:                 Esophagogastric landmarks were identified: the                            Z-line was found at 36 cm, the gastroesophageal                            junction was found at 36 cm and the upper extent of                            the gastric folds was found at 40 cm from the                            incisors.                           A 4 cm hiatal hernia was present.                           The Z-line was just slightly irregular with small                            area of suspected scarring / mild erosive change at  the 12 o'clock position and was found 36 cm from                            the incisors. Biopsies were taken with a cold                            forceps for histology and of the z-line.                           The exam of the esophagus was otherwise normal.                           The entire examined stomach was normal.                           Nodular mucosa was found in the duodenal bulb                            consistent with benign ectopic gastric mucosa.                           The exam of the duodenum was otherwise normal. Complications:            No immediate complications. Estimated blood loss:                            Minimal. Estimated Blood Loss:     Estimated blood loss was minimal. Impression:               - Esophagogastric landmarks identified.                           - 4 cm hiatal hernia.                           - Z-line as outlined above at 36 cm from the                            incisors. Biopsied.                            - Normal esophagus otherwise.                           - Normal stomach.                           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                           - Normal duodenum otherwise. Recommendation:           - Patient has a contact number available for  emergencies. The signs and symptoms of potential                            delayed complications were discussed with the                            patient. Return to normal activities tomorrow.                            Written discharge instructions were provided to the                            patient.                           - Resume previous diet.                           - Continue present medications.                           - Await pathology results with further                            recommendations Harold Spare P. Tashai Catino, MD 12/09/2019 11:23:10 AM This report has been signed electronically.

## 2019-12-09 NOTE — Patient Instructions (Signed)
Handouts given for Barrett's Esophagus and Hiatal Hernia.  Await pathology results.  YOU HAD AN ENDOSCOPIC PROCEDURE TODAY AT THE  ENDOSCOPY CENTER:   Refer to the procedure report that was given to you for any specific questions about what was found during the examination.  If the procedure report does not answer your questions, please call your gastroenterologist to clarify.  If you requested that your care partner not be given the details of your procedure findings, then the procedure report has been included in a sealed envelope for you to review at your convenience later.  YOU SHOULD EXPECT: Some feelings of bloating in the abdomen. Passage of more gas than usual.  Walking can help get rid of the air that was put into your GI tract during the procedure and reduce the bloating. If you had a lower endoscopy (such as a colonoscopy or flexible sigmoidoscopy) you may notice spotting of blood in your stool or on the toilet paper. If you underwent a bowel prep for your procedure, you may not have a normal bowel movement for a few days.  Please Note:  You might notice some irritation and congestion in your nose or some drainage.  This is from the oxygen used during your procedure.  There is no need for concern and it should clear up in a day or so.  SYMPTOMS TO REPORT IMMEDIATELY:    Following upper endoscopy (EGD)  Vomiting of blood or coffee ground material  New chest pain or pain under the shoulder blades  Painful or persistently difficult swallowing  New shortness of breath  Fever of 100F or higher  Black, tarry-looking stools  For urgent or emergent issues, a gastroenterologist can be reached at any hour by calling [phone]. Do not use MyChart messaging for urgent concerns.    DIET:  We do recommend a small meal at first, but then you may proceed to your regular diet.  Drink plenty of fluids but you should avoid alcoholic beverages for 24 hours.  ACTIVITY:  You should plan  to take it easy for the rest of today and you should NOT DRIVE or use heavy machinery until tomorrow (because of the sedation medicines used during the test).    FOLLOW UP: Our staff will call the number listed on your records 48-72 hours following your procedure to check on you and address any questions or concerns that you may have regarding the information given to you following your procedure. If we do not reach you, we will leave a message.  We will attempt to reach you two times.  During this call, we will ask if you have developed any symptoms of COVID 19. If you develop any symptoms (ie: fever, flu-like symptoms, shortness of breath, cough etc.) before then, please call [phone].  If you test positive for Covid 19 in the 2 weeks post procedure, please call and report this information to [place].    If any biopsies were taken you will be contacted by phone or by letter within the next 1-3 weeks.  Please call us at [phone] if you have not heard about the biopsies in 3 weeks.    SIGNATURES/CONFIDENTIALITY: You and/or your care partner have signed paperwork which will be entered into your electronic medical record.  These signatures attest to the fact that that the information above on your After Visit Summary has been reviewed and is understood.  Full responsibility of the confidentiality of this discharge information lies with you and/or your care-partner.

## 2019-12-09 NOTE — Progress Notes (Signed)
A and O x3. Report to RN. Tolerated MAC anesthesia well.Teeth unchanged after procedure.

## 2019-12-09 NOTE — Telephone Encounter (Signed)
-----   Message from Mayer Masker, [place] sent at 12/09/2019  3:29 PM EDT ----- Harold Collins,  Please call Harold Collins and notify of lab results. Most labs were essentially within normal limits. Lipid panel- total cholesterol elevated and triglycerides elevated at 825. Pt has history of elevated triglycerides and he has been on Niacin and Lovaza in the past. 1 year ago triglycerides were under better control at 207 and I would like to restart statin and Lovaza to at least lower triglycerides in 500 range. Let me know if patient is agreeable and I will send in Crestor 20 mg and Lovaza. He should return in 6 weeks for lab visit only to recheck Lipid panel and hepatic function. Recommend to follow heart healthy diet and continue to stay as active as possible.   Thank you, Kandis Cocking

## 2019-12-10 LAB — LDL CHOLESTEROL, DIRECT: LDL Direct: 63 mg/dL (ref 0–99)

## 2019-12-10 LAB — SPECIMEN STATUS REPORT

## 2019-12-11 ENCOUNTER — Telehealth (INDEPENDENT_AMBULATORY_CARE_PROVIDER_SITE_OTHER): Payer: Commercial Managed Care - PPO | Admitting: Psychiatry

## 2019-12-11 ENCOUNTER — Telehealth: Payer: Self-pay | Admitting: *Deleted

## 2019-12-11 ENCOUNTER — Encounter: Payer: Self-pay | Admitting: Gastroenterology

## 2019-12-11 ENCOUNTER — Encounter (HOSPITAL_COMMUNITY): Payer: Self-pay | Admitting: Psychiatry

## 2019-12-11 ENCOUNTER — Telehealth: Payer: Self-pay

## 2019-12-11 ENCOUNTER — Other Ambulatory Visit: Payer: Self-pay

## 2019-12-11 VITALS — Wt 218.0 lb

## 2019-12-11 DIAGNOSIS — F331 Major depressive disorder, recurrent, moderate: Secondary | ICD-10-CM

## 2019-12-11 DIAGNOSIS — F4310 Post-traumatic stress disorder, unspecified: Secondary | ICD-10-CM

## 2019-12-11 DIAGNOSIS — F419 Anxiety disorder, unspecified: Secondary | ICD-10-CM

## 2019-12-11 MED ORDER — MIRTAZAPINE 15 MG PO TABS: 15.0000 mg | ORAL_TABLET | Freq: Every day | ORAL | 2 refills | Status: DC

## 2019-12-11 MED ORDER — HYDROXYZINE PAMOATE 25 MG PO CAPS: 25.0000 mg | ORAL_CAPSULE | Freq: Four times a day (QID) | ORAL | 2 refills | Status: DC | PRN

## 2019-12-11 MED ORDER — LAMOTRIGINE 200 MG PO TABS: 200.0000 mg | ORAL_TABLET | Freq: Every day | ORAL | 2 refills | Status: DC

## 2019-12-11 NOTE — Telephone Encounter (Signed)
  Follow up Call-  Call back number 12/09/2019  Post procedure Call Back phone  # [phone]  Permission to leave phone message Yes  Some recent data might be hidden     Patient questions:  Do you have a fever, pain , or abdominal swelling? No. Pain Score  0 *  Have you tolerated food without any problems? Yes.    Have you been able to return to your normal activities? Yes.    Do you have any questions about your discharge instructions: Diet   No. Medications  No. Follow up visit  No.  Do you have questions or concerns about your Care? No.  Actions: * If pain score is 4 or above: No action needed, pain <4.  Have you developed a fever since your procedure? No 2.   Have you had an respiratory symptoms (SOB or cough) since your procedure? No  3.   Have you tested positive for COVID 19 since your procedure No  4.   Have you had any family members/close contacts diagnosed with the COVID 19 since your procedure?  No   If yes to any of these questions please route to Laverna Peace, RN and Charlett Lango, RN

## 2019-12-11 NOTE — Telephone Encounter (Signed)
  Follow up Call-  Call back number 12/09/2019  Post procedure Call Back phone  # [phone]  Permission to leave phone message Yes  Some recent data might be hidden     Patient questions:  Message left to call us if necessary.

## 2019-12-11 NOTE — Progress Notes (Signed)
Virtual Visit via Telephone Note  I connected with Harold Collins on 12/11/19 at  3:00 PM EDT by telephone and verified that I am speaking with the correct person using two identifiers.   I discussed the limitations, risks, security and privacy concerns of performing an evaluation and management service by telephone and the availability of in person appointments. I also discussed with the patient that there may be a patient responsible charge related to this service. The patient expressed understanding and agreed to proceed.  Patient Location: Home Provider Location: Home office  History of Present Illness: Patient is evaluated by phone session.  He is taking his medication and he is feeling better.  Recently he had knee surgery and now he is on restriction work.  He is working from home but he will go back in person next.  His knee pain is getting better.  Recently had blood work and his triglycerides was in the 800s.  Patient told this is the first time it is so high.  Usually it runs in 507 100.  He is taking Crestor and niacin.  He feels his anxiety nightmares is a stable.  He lives with his wife who is supportive and 20-year-old son.  Denies any recent crying spells or any feeling of hopelessness or worthlessness.  He is working as a Optometrist.  His appetite is okay.  His weight is stable.    Past Psychiatric History:Reviewed. H/Odepression as a teenager. H/Ocut himself and overdosebutnoinpatient treatment.H/Osevere mood swing,night mares,anger,fights anddepression. While in [place] 10 days jaildue to assault charges.TriedPaxil,BuSpar, Lexapro, Prozac, Brintellix and Effexor. Recently we tried trazodone (not helpful)We did gene testing Prozac and Lamictal are favorable.   Recent Results (from the past 2160 hour(s))  POCT glycosylated hemoglobin (Hb A1C)     Status: Normal   Collection Time: 11/18/19  2:51 PM  Result Value Ref Range   Hemoglobin A1C 5.4 4.0 - 5.6 %    HbA1c POC (<> result, manual entry)     HbA1c, POC (prediabetic range)     HbA1c, POC (controlled diabetic range)    Comp Met (CMET)     Status: Abnormal   Collection Time: 12/03/19  8:57 AM  Result Value Ref Range   Glucose 103 (H) 65 - 99 mg/dL   BUN 12 6 - 20 mg/dL   Creatinine, Ser 1.01 0.76 - 1.27 mg/dL   GFR calc non Af Amer 95 >59 mL/min/1.73   GFR calc Af Amer 109 >59 mL/min/1.73    Comment: **[facility] currently reports eGFR in compliance with the current**   recommendations of the [employer]. [facility] will   update reporting as new guidelines are published from the NKF-ASN   Task force.    BUN/Creatinine Ratio 12 9 - 20   Sodium 143 134 - 144 mmol/L   Potassium 4.7 3.5 - 5.2 mmol/L   Chloride 105 96 - 106 mmol/L   CO2 23 20 - 29 mmol/L   Calcium 9.5 8.7 - 10.2 mg/dL   Total Protein 6.8 6.0 - 8.5 g/dL   Albumin 4.3 4.0 - 5.0 g/dL   Globulin, Total 2.5 1.5 - 4.5 g/dL   Albumin/Globulin Ratio 1.7 1.2 - 2.2   Bilirubin Total 0.4 0.0 - 1.2 mg/dL   Alkaline Phosphatase 77 48 - 121 IU/L   AST 16 0 - 40 IU/L   ALT 21 0 - 44 IU/L  Hemoglobin A1c     Status: None   Collection Time: 12/03/19  8:57 AM  Result  Value Ref Range   Hgb A1c MFr Bld 5.5 4.8 - 5.6 %    Comment:          Prediabetes: 5.7 - 6.4          Diabetes: >6.4          Glycemic control for adults with diabetes: <7.0    Est. average glucose Bld gHb Est-mCnc 111 mg/dL  Lipid panel     Status: Abnormal   Collection Time: 12/03/19  8:57 AM  Result Value Ref Range   Cholesterol, Total 226 (H) 100 - 199 mg/dL   Triglycerides 825 (HH) 0 - 149 mg/dL   HDL 24 (L) >39 mg/dL   VLDL Cholesterol Cal Comment (A) 5 - 40 mg/dL    Comment: The calculation for the VLDL cholesterol is not valid when triglyceride level is >800 mg/dL.    LDL Chol Calc (NIH) Comment (A) 0 - 99 mg/dL    Comment: Triglyceride result indicated is too high for an accurate LDL cholesterol estimation.    Chol/HDL Ratio 9.4 (H) 0.0 -  5.0 ratio    Comment:                                   T. Chol/HDL Ratio                                             Men  Women                               1/2 Avg.Risk  3.4    3.3                                   Avg.Risk  5.0    4.4                                2X Avg.Risk  9.6    7.1                                3X Avg.Risk 23.4   11.0   LDL cholesterol, direct     Status: None   Collection Time: 12/03/19  8:57 AM  Result Value Ref Range   LDL Direct 63 0 - 99 mg/dL  Specimen status report     Status: None   Collection Time: 12/03/19  8:57 AM  Result Value Ref Range   specimen status report Comment     Comment: Written Authorization Written Authorization Written Authorization Received. Authorization received from [place] 12-09-2019 Logged by Valentino Saxon   Urine Microalbumin w/creat. ratio     Status: None   Collection Time: 12/03/19  9:02 AM  Result Value Ref Range   Creatinine, Urine 213.4 Not Estab. mg/dL   Microalbumin, Urine 3.2 Not Estab. ug/mL   Microalb/Creat Ratio 1 0 - 29 mg/g creat    Comment:                        Normal:  0 -  29                        Moderately increased: 30 - 300                        Severely increased:       >300      Psychiatric Specialty Exam: Physical Exam  Review of Systems  Weight 218 lb (98.9 kg).There is no height or weight on file to calculate BMI.  General Appearance: NA  Eye Contact:  NA  Speech:  Clear and Coherent  Volume:  Decreased  Mood:  Anxious and Dysphoric  Affect:  NA  Thought Process:  Goal Directed  Orientation:  Full (Time, Place, and Person)  Thought Content:  Rumination  Suicidal Thoughts:  No  Homicidal Thoughts:  No  Memory:  Immediate;   Good Recent;   Good Remote;   Good  Judgement:  Good  Insight:  Good  Psychomotor Activity:  NA  Concentration:  Concentration: Good and Attention Span: Good  Recall:  Good  Fund of Knowledge:  Good  Language:  Good   Akathisia:  No  Handed:  Right  AIMS (if indicated):     Assets:  Communication Skills Desire for Improvement Housing Resilience Social Support  ADL's:  Intact  Cognition:  WNL  Sleep:   ok      Assessment and Plan: Major depressive disorder, recurrent.  PTSD.  Anxiety.  I reviewed blood work results.  He has very high triglycerides and I discussed that we should try stopping the Seroquel as Seroquel may contribute to high triglycerides.  He has a family history but this is the first time he has such a high triglyceride.  He is on niacin and Crestor.  Patient agreed with the plan.  We will discontinue Seroquel and try Remeron 50 mg at bedtime to help anxiety and sleep.  Continue Lamictal 200 mg daily and hydroxyzine 25 mg 3-4 times a day.  I recommend if he has any side effects or any concern then he should call us immediately.  Discussed medication side effects and benefits.  Patient is not interested in therapy.  Follow-up in 3 months.  Follow Up Instructions:    I discussed the assessment and treatment plan with the patient. The patient was provided an opportunity to ask questions and all were answered. The patient agreed with the plan and demonstrated an understanding of the instructions.   The patient was advised to call back or seek an in-person evaluation if the symptoms worsen or if the condition fails to improve as anticipated.  I provided 20 minutes of non-face-to-face time during this encounter.   Kathlee Nations, MD

## 2020-01-30 ENCOUNTER — Other Ambulatory Visit: Payer: Self-pay

## 2020-02-17 ENCOUNTER — Other Ambulatory Visit: Payer: Self-pay | Admitting: Physician Assistant

## 2020-02-17 DIAGNOSIS — E1169 Type 2 diabetes mellitus with other specified complication: Secondary | ICD-10-CM

## 2020-02-17 DIAGNOSIS — E119 Type 2 diabetes mellitus without complications: Secondary | ICD-10-CM

## 2020-02-17 DIAGNOSIS — E786 Lipoprotein deficiency: Secondary | ICD-10-CM

## 2020-02-17 DIAGNOSIS — R7303 Prediabetes: Secondary | ICD-10-CM

## 2020-02-17 DIAGNOSIS — E781 Pure hyperglyceridemia: Secondary | ICD-10-CM

## 2020-02-19 ENCOUNTER — Other Ambulatory Visit: Payer: Self-pay

## 2020-02-19 ENCOUNTER — Other Ambulatory Visit: Payer: Commercial Managed Care - PPO

## 2020-02-19 DIAGNOSIS — E786 Lipoprotein deficiency: Secondary | ICD-10-CM

## 2020-02-19 DIAGNOSIS — R7303 Prediabetes: Secondary | ICD-10-CM

## 2020-02-19 DIAGNOSIS — E119 Type 2 diabetes mellitus without complications: Secondary | ICD-10-CM

## 2020-02-19 DIAGNOSIS — E781 Pure hyperglyceridemia: Secondary | ICD-10-CM

## 2020-02-19 DIAGNOSIS — E1169 Type 2 diabetes mellitus with other specified complication: Secondary | ICD-10-CM

## 2020-02-20 LAB — CBC
Hematocrit: 48.7 % (ref 37.5–51.0)
Hemoglobin: 17 g/dL (ref 13.0–17.7)
MCH: 31.8 pg (ref 26.6–33.0)
MCHC: 34.9 g/dL (ref 31.5–35.7)
MCV: 91 fL (ref 79–97)
Platelets: 355 10*3/uL (ref 150–450)
RBC: 5.34 x10E6/uL (ref 4.14–5.80)
RDW: 14 % (ref 11.6–15.4)
WBC: 6.6 10*3/uL (ref 3.4–10.8)

## 2020-02-20 LAB — COMPREHENSIVE METABOLIC PANEL
ALT: 42 IU/L (ref 0–44)
AST: 22 IU/L (ref 0–40)
Albumin/Globulin Ratio: 2.1 (ref 1.2–2.2)
Albumin: 4.9 g/dL (ref 4.0–5.0)
Alkaline Phosphatase: 83 IU/L (ref 48–121)
BUN/Creatinine Ratio: 9 (ref 9–20)
BUN: 11 mg/dL (ref 6–20)
Bilirubin Total: 0.4 mg/dL (ref 0.0–1.2)
CO2: 26 mmol/L (ref 20–29)
Calcium: 9.8 mg/dL (ref 8.7–10.2)
Chloride: 104 mmol/L (ref 96–106)
Creatinine, Ser: 1.2 mg/dL (ref 0.76–1.27)
GFR calc Af Amer: 88 mL/min/{1.73_m2} (ref >59)
GFR calc non Af Amer: 76 mL/min/{1.73_m2} (ref >59)
Globulin, Total: 2.3 g/dL (ref 1.5–4.5)
Glucose: 106 mg/dL — ABNORMAL HIGH (ref 65–99)
Potassium: 4.9 mmol/L (ref 3.5–5.2)
Sodium: 141 mmol/L (ref 134–144)
Total Protein: 7.2 g/dL (ref 6.0–8.5)

## 2020-02-20 LAB — LIPID PANEL
Chol/HDL Ratio: 3.8 ratio (ref 0.0–5.0)
Cholesterol, Total: 130 mg/dL (ref 100–199)
HDL: 34 mg/dL — ABNORMAL LOW (ref >39)
LDL Chol Calc (NIH): 57 mg/dL (ref 0–99)
Triglycerides: 240 mg/dL — ABNORMAL HIGH (ref 0–149)
VLDL Cholesterol Cal: 39 mg/dL (ref 5–40)

## 2020-02-20 LAB — HEMOGLOBIN A1C
Est. average glucose Bld gHb Est-mCnc: 120 mg/dL
Hgb A1c MFr Bld: 5.8 % — ABNORMAL HIGH (ref 4.8–5.6)

## 2020-02-20 LAB — TSH: TSH: 2.07 u[IU]/mL (ref 0.450–4.500)

## 2020-03-08 ENCOUNTER — Telehealth (INDEPENDENT_AMBULATORY_CARE_PROVIDER_SITE_OTHER): Payer: Commercial Managed Care - PPO | Admitting: Psychiatry

## 2020-03-08 ENCOUNTER — Encounter (HOSPITAL_COMMUNITY): Payer: Self-pay | Admitting: Psychiatry

## 2020-03-08 ENCOUNTER — Other Ambulatory Visit: Payer: Self-pay

## 2020-03-08 VITALS — Wt 225.0 lb

## 2020-03-08 DIAGNOSIS — F331 Major depressive disorder, recurrent, moderate: Secondary | ICD-10-CM

## 2020-03-08 DIAGNOSIS — F419 Anxiety disorder, unspecified: Secondary | ICD-10-CM

## 2020-03-08 DIAGNOSIS — F4310 Post-traumatic stress disorder, unspecified: Secondary | ICD-10-CM

## 2020-03-08 MED ORDER — HYDROXYZINE PAMOATE 25 MG PO CAPS: 25.0000 mg | ORAL_CAPSULE | Freq: Four times a day (QID) | ORAL | 2 refills | Status: DC | PRN

## 2020-03-08 MED ORDER — MIRTAZAPINE 15 MG PO TABS: 15.0000 mg | ORAL_TABLET | Freq: Every day | ORAL | 2 refills | Status: DC

## 2020-03-08 MED ORDER — LAMOTRIGINE 200 MG PO TABS: 200.0000 mg | ORAL_TABLET | Freq: Every day | ORAL | 2 refills | Status: DC

## 2020-03-08 NOTE — Progress Notes (Signed)
Virtual Visit via Telephone Note  I connected with Harold Collins on 03/08/20 at  3:00 PM EDT by telephone and verified that I am speaking with the correct person using two identifiers.  Location: Patient: home Provider: home office   I discussed the limitations, risks, security and privacy concerns of performing an evaluation and management service by telephone and the availability of in person appointments. I also discussed with the patient that there may be a patient responsible charge related to this service. The patient expressed understanding and agreed to proceed.   History of Present Illness: Patient is evaluated by phone session.  He admitted lately under a lot of stress.  He was let go by his job because he could not drive 50 miles 1 way for work.  Patient told on August 15 his orthopedic physician recommended light duty and his supervisor did not like and gave him a job which requires 50 miles 1 way and he could not do it.  He tried to talk with the supervisor but it did not work and ultimately he lost his job.  He is a still on light duty and cannot work without restrictions.  He is hoping once prescription left then he can apply the job.  His 38-year-old son started school.  He lives with his wife who is supportive.  He also upset when find out that his ex moved from [place] to [place] without his knowledge.  He had a hard time getting in contact with his 74 year old daughter.  Recently he find out that he had to get court order acceptance from [place] to have visits.  Patient told it cost $4000 he called last week to his daughter and explained the situation.  He told his daughter that she can always visit him.  Patient also had a blood work recently and he is pleased that his triglycerides dropped from 800 to now to 40.  He is no longer taking quetiapine.  He is taking mirtazapine and hydroxyzine as needed 3-4 times a day which is helping his anxiety and sleep.  Though he was hoping  that his weight will reduce that since the knee surgery he is not as mobile and gain weight.  He is hoping once he gets clearance from his physician that he may able to start walking exercise unable to get a job.  Denies any anger, mania, psychosis, crying spells or suicidal thoughts.  He wants to continue mirtazapine, Lamictal and hydroxyzine.  He has no rash, itching tremors or shakes.   Past Psychiatric History:Reviewed. H/Odepression as a teenager. H/Ocut himself and overdosebutnoinpatient treatment.H/Osevere mood swing,night mares,anger,fights anddepression. While in [place] 38 days jaildue to assault charges.TriedPaxil,BuSpar, Lexapro, Prozac, Brintellix and Effexor. Recently we tried trazodone (not helpful)We did gene testing Prozac and Lamictal are favorable.    Psychiatric Specialty Exam: Physical Exam  Review of Systems  Weight 225 lb (102.1 kg).There is no height or weight on file to calculate BMI.  General Appearance: NA  Eye Contact:  NA  Speech:  Slow  Volume:  Decreased  Mood:  Anxious and Dysphoric  Affect:  NA  Thought Process:  Descriptions of Associations: Intact  Orientation:  Full (Time, Place, and Person)  Thought Content:  Rumination  Suicidal Thoughts:  No  Homicidal Thoughts:  No  Memory:  Immediate;   Good Recent;   Good Remote;   Good  Judgement:  Good  Insight:  Present  Psychomotor Activity:  NA  Concentration:  Concentration: Fair and Attention Span:  Fair  Recall:  [place] of Knowledge:  Good  Language:  Good  Akathisia:  No  Handed:  Right  AIMS (if indicated):     Assets:  Communication Skills Desire for Improvement Social Support  ADL's:  Intact  Cognition:  WNL  Sleep:   fair      Assessment and Plan: Major depressive disorder, recurrent.  PTSD.  Anxiety.  Discontinue Seroquel as patient had stopped and his triglyceride is much better.  He is sleeping better.  We will continue mirtazapine 15 mg at bedtime,  Lamictal 200 mg daily and hydroxyzine 25 mg 3-4 times a day.  Recommended to call us back if is any question or any concern.  I offered therapy but at this time patient is not interested.  Follow-up in 3 months.  Follow Up Instructions:    I discussed the assessment and treatment plan with the patient. The patient was provided an opportunity to ask questions and all were answered. The patient agreed with the plan and demonstrated an understanding of the instructions.   The patient was advised to call back or seek an in-person evaluation if the symptoms worsen or if the condition fails to improve as anticipated.  I provided 21 minutes of non-face-to-face time during this encounter.   Cleotis Nipper, MD

## 2020-03-19 ENCOUNTER — Ambulatory Visit (INDEPENDENT_AMBULATORY_CARE_PROVIDER_SITE_OTHER): Payer: Self-pay | Admitting: Physician Assistant

## 2020-03-19 ENCOUNTER — Other Ambulatory Visit: Payer: Self-pay

## 2020-03-19 ENCOUNTER — Encounter: Payer: Self-pay | Admitting: Physician Assistant

## 2020-03-19 VITALS — BP 113/81 | HR 78 | Ht 71.0 in | Wt 233.6 lb

## 2020-03-19 DIAGNOSIS — E119 Type 2 diabetes mellitus without complications: Secondary | ICD-10-CM

## 2020-03-19 DIAGNOSIS — E781 Pure hyperglyceridemia: Secondary | ICD-10-CM

## 2020-03-19 DIAGNOSIS — F332 Major depressive disorder, recurrent severe without psychotic features: Secondary | ICD-10-CM

## 2020-03-19 DIAGNOSIS — F39 Unspecified mood [affective] disorder: Secondary | ICD-10-CM

## 2020-03-19 DIAGNOSIS — E1169 Type 2 diabetes mellitus with other specified complication: Secondary | ICD-10-CM

## 2020-03-19 DIAGNOSIS — F411 Generalized anxiety disorder: Secondary | ICD-10-CM

## 2020-03-19 DIAGNOSIS — E782 Mixed hyperlipidemia: Secondary | ICD-10-CM

## 2020-03-19 DIAGNOSIS — B370 Candidal stomatitis: Secondary | ICD-10-CM

## 2020-03-19 MED ORDER — FLUCONAZOLE 100 MG PO TABS: ORAL_TABLET | ORAL | 0 refills | Status: DC

## 2020-03-19 NOTE — Progress Notes (Signed)
Established Patient Office Visit  Subjective:  Patient ID: Harold Collins, male    DOB: 04-26-1981  Age: 39 y.o. MRN: [id number]  CC:  Chief Complaint  Patient presents with  . Diabetes  . Hyperlipidemia  . Anxiety  . Depression    HPI Harold Collins presents for chronic follow up on diabetes mellitus, hyperlipidemia, and mood.  Diabetes: Pt denies increased thirst. He does report increased urination. He does drink several caffeine drinks throughout the day. Denies nocturia. He does have a glucometer at home but doesn't check routinely. He sometimes does check his glucose when he doesn't feel good. States sometimes he gets nauseous and will eat something which makes it better. He hasn't been as active due to recovering from his knee pain.   HLD: Pt taking medication as directed without issues. Denies side effects including myalgias and RUQ pain.   Mood: Reports increased stress from recently losing his job. He continues to see Dr. Adele Schilder. Denies SI/HI.  Thrush: Reports he completed treatment and symptoms were improving but didn't completely resolve.   Past Medical History:  Diagnosis Date  . Allergy    animal  . Anxiety   . Barrett's esophagus   . Depression   . GERD (gastroesophageal reflux disease)   . Major depressive disorder   . PTSD (post-traumatic stress disorder)     Past Surgical History:  Procedure Laterality Date  . esophageal abrasions  2017   2016 4 times  . MENISCUS REPAIR Left   . SHOULDER SURGERY    . UPPER GASTROINTESTINAL ENDOSCOPY  2017   2016  . WRIST SURGERY      Family History  Problem Relation Age of Onset  . Depression Mother   . Diabetes Mother   . Anxiety disorder Mother   . Physical abuse Mother   . Colon polyps Mother   . Cancer Father        gsophageal  . Depression Father   . Anxiety disorder Father   . Physical abuse Father   . Esophageal cancer Father   . Depression Sister   . Anxiety disorder Sister   . Physical abuse  Sister   . Depression Brother   . ADD / ADHD Brother   . Anxiety disorder Brother   . Colon cancer Neg Hx   . Rectal cancer Neg Hx   . Stomach cancer Neg Hx     Social History   Socioeconomic History  . Marital status: Married    Spouse name: Not on file  . Number of children: 2  . Years of education: Not on file  . Highest education level: High school graduate  Occupational History  . Not on file  Tobacco Use  . Smoking status: Current Every Day Smoker    Packs/day: 1.00    Types: Cigarettes  . Smokeless tobacco: Never Used  Vaping Use  . Vaping Use: Former  . Quit date: 04/26/2017  Substance and Sexual Activity  . Alcohol use: No  . Drug use: No  . Sexual activity: Yes    Partners: Female    Birth control/protection: None  Other Topics Concern  . Not on file  Social History Narrative  . Not on file   Social Determinants of Health   Financial Resource Strain:   . Difficulty of Paying Living Expenses: Not on file  Food Insecurity:   . Worried About Charity fundraiser in the Last Year: Not on file  . Ran Out of Food in the  Last Year: Not on file  Transportation Needs:   . Lack of Transportation (Medical): Not on file  . Lack of Transportation (Non-Medical): Not on file  Physical Activity:   . Days of Exercise per Week: Not on file  . Minutes of Exercise per Session: Not on file  Stress:   . Feeling of Stress : Not on file  Social Connections:   . Frequency of Communication with Friends and Family: Not on file  . Frequency of Social Gatherings with Friends and Family: Not on file  . Attends Religious Services: Not on file  . Active Member of Clubs or Organizations: Not on file  . Attends Banker Meetings: Not on file  . Marital Status: Not on file  Intimate Partner Violence:   . Fear of Current or Ex-Partner: Not on file  . Emotionally Abused: Not on file  . Physically Abused: Not on file  . Sexually Abused: Not on file    Outpatient  Medications Prior to Visit  Medication Sig Dispense Refill  . Cholecalciferol 25 MCG (1000 UT) capsule Vitamin D3 25 mcg (1,000 unit) capsule  Take by oral route.    . hydrOXYzine (VISTARIL) 25 MG capsule Take 1 capsule (25 mg total) by mouth every 6 (six) hours as needed for anxiety. 120 capsule 2  . lamoTRIgine (LAMICTAL) 200 MG tablet Take 1 tablet (200 mg total) by mouth daily. 30 tablet 2  . mirtazapine (REMERON) 15 MG tablet Take 1 tablet (15 mg total) by mouth at bedtime. 30 tablet 2  . niacin (NIASPAN) 1000 MG CR tablet Take 2,000 mg by mouth at bedtime.    Marland Kitchen omega-3 acid ethyl esters (LOVAZA) 1 g capsule Take 1 capsule (1 g total) by mouth daily. 90 capsule 0  . omeprazole (PRILOSEC) 40 MG capsule Take 1 capsule (40 mg total) by mouth daily. 90 capsule 3  . rosuvastatin (CRESTOR) 20 MG tablet Take 1 tablet (20 mg total) by mouth daily. 90 tablet 0  . blood glucose meter kit and supplies Dispense based on patient and insurance preference. Use to check glucose level fasting in the morning and 2 hours after largest meal. (FOR ICD-10 E10.9, E11.9). (Patient not taking: Reported on 12/09/2019) 1 each 0  . loratadine-pseudoephedrine (CLARITIN-D 24-HOUR) 10-240 MG 24 hr tablet Take 1 tablet by mouth daily. (Patient not taking: Reported on 03/19/2020)    . QUEtiapine (SEROQUEL) 100 MG tablet Take 1 tablet (100 mg total) by mouth at bedtime. (Patient not taking: Reported on 03/08/2020) 30 tablet 2  . fluconazole (DIFLUCAN) 100 MG tablet Take 1 tablet (100 mg total) by mouth daily. (Patient not taking: Reported on 12/09/2019) 7 tablet 0   No facility-administered medications prior to visit.    No Known Allergies  ROS Review of Systems A fourteen system review of systems was performed and found to be positive as per HPI.   Objective:    Physical Exam General:  Well Developed, well nourished, in no acute distress Neuro:  Alert and oriented,  extra-ocular muscles intact  HEENT:  Normocephalic,  atraumatic, tongue- hypertrophied papillae with white lesions, neck supple Skin:  no gross rash, warm, pink. Cardiac:  RRR, S1 S2 Respiratory:  ECTA B/L and A/P, Not using accessory muscles, speaking in full sentences- unlabored. Vascular:  Ext warm, no cyanosis apprec.; cap RF less 2 sec. Psych: Judgement and insight good, Euthymic mood. Full Affect.    BP 113/81   Pulse 78   Ht 5\' 11"  (1.803 m)  Wt 233 lb 9.6 oz (106 kg)   SpO2 97%   BMI 32.58 kg/m  Wt Readings from Last 3 Encounters:  03/19/20 233 lb 9.6 oz (106 kg)  12/09/19 218 lb (98.9 kg)  11/28/19 218 lb (98.9 kg)     Health Maintenance Due  Topic Date Due  . Hepatitis C Screening  Never done  . OPHTHALMOLOGY EXAM  Never done  . COVID-19 Vaccine (1) Never done  . INFLUENZA VACCINE  01/25/2020    There are no preventive care reminders to display for this patient.  Lab Results  Component Value Date   TSH 2.070 02/19/2020   Lab Results  Component Value Date   WBC 6.6 02/19/2020   HGB 17.0 02/19/2020   HCT 48.7 02/19/2020   MCV 91 02/19/2020   PLT 355 02/19/2020   Lab Results  Component Value Date   NA 141 02/19/2020   K 4.9 02/19/2020   CO2 26 02/19/2020   GLUCOSE 106 (H) 02/19/2020   BUN 11 02/19/2020   CREATININE 1.20 02/19/2020   BILITOT 0.4 02/19/2020   ALKPHOS 83 02/19/2020   AST 22 02/19/2020   ALT 42 02/19/2020   PROT 7.2 02/19/2020   ALBUMIN 4.9 02/19/2020   CALCIUM 9.8 02/19/2020   ANIONGAP 8 06/23/2017   Lab Results  Component Value Date   CHOL 130 02/19/2020   Lab Results  Component Value Date   HDL 34 (L) 02/19/2020   Lab Results  Component Value Date   LDLCALC 57 02/19/2020   Lab Results  Component Value Date   TRIG 240 (H) 02/19/2020   Lab Results  Component Value Date   CHOLHDL 3.8 02/19/2020   Lab Results  Component Value Date   HGBA1C 5.8 (H) 02/19/2020      Assessment & Plan:   Problem List Items Addressed This Visit      Endocrine   Mixed diabetic  hyperlipidemia associated with type 2 diabetes mellitus (HCC) (Chronic)   Diabetes mellitus ([place]) - Primary     Other   Hypertriglyceridemia (Chronic)   GAD (generalized anxiety disorder)   Severe episode of recurrent major depressive disorder, without psychotic features (HCC)   Mood disorder ([place])    Other Visit Diagnoses    Oral candidiasis       Relevant Medications   fluconazole (DIFLUCAN) 100 MG tablet     Diabetes Mellitus, diet controlled: -Recent A1c at goal -Follow a low carbohydrate and glucose diet. -Increase physical activity as tolerated. -Will continue to monitor.  Mixed diabetic hyperlipidemia associated with type 2 diabetes mellitus, Hypertriglyceridemia: -Last lipid panel improved from prior -Continue current medication regimen. -Follow a heart healthy diet low in saturated and trans fats, and monitor simple carbohydrates. -Will continue to monitor.  GAD, Severe MDD, Mood disorder: -Followed by Psychiatry. -Continue current medication regimen. Medications were recently adjusted. -Recommend to incorporate nonpharmacologic therapy such as using the calm app or mindfulness therapy.  Oral candidiasis: -Will send rx for Fluconazole. If symptoms fail to improve or worsen recommend further evaluation.     Meds ordered this encounter  Medications  . fluconazole (DIFLUCAN) 100 MG tablet    Sig: Take 2 tablets by mouth x 1 dose. Then take 1 table daily x 10 days.    Dispense:  12 tablet    Refill:  0    Order Specific Question:   Supervising Provider    Answer:   Beatrice Lecher D [[id number]    Follow-up: Return in about 4 months (around  07/19/2020) for CPE and FBW.   Note:  This note was prepared with assistance of Dragon voice recognition software. Occasional wrong-word or sound-a-like substitutions may have occurred due to the inherent limitations of voice recognition software.  Lorrene Reid, PA-C

## 2020-03-19 NOTE — Patient Instructions (Signed)
Prediabetes Prediabetes is the condition of having a blood sugar (blood glucose) level that is higher than it should be, but not high enough for you to be diagnosed with type 2 diabetes. Having prediabetes puts you at risk for developing type 2 diabetes (type 2 diabetes mellitus). Prediabetes may be called impaired glucose tolerance or impaired fasting glucose. Prediabetes usually does not cause symptoms. Your health care provider can diagnose this condition with blood tests. You may be tested for prediabetes if you are overweight and if you have at least one other risk factor for prediabetes. What is blood glucose, and how is it measured? Blood glucose refers to the amount of glucose in your bloodstream. Glucose comes from eating foods that contain sugars and starches (carbohydrates), which the body breaks down into glucose. Your blood glucose level may be measured in mg/dL (milligrams per deciliter) or mmol/L (millimoles per liter). Your blood glucose may be checked with one or more of the following blood tests:  A fasting blood glucose (FBG) test. You will not be allowed to eat (you will fast) for 8 hours or longer before a blood sample is taken. ? A normal range for FBG is 70-100 mg/dl (3.9-5.6 mmol/L).  An A1c (hemoglobin A1c) blood test. This test provides information about blood glucose control over the previous 2?3months.  An oral glucose tolerance test (OGTT). This test measures your blood glucose at two times: ? After fasting. This is your baseline level. ? Two hours after you drink a beverage that contains glucose. You may be diagnosed with prediabetes:  If your FBG is 100?125 mg/dL (5.6-6.9 mmol/L).  If your A1c level is 5.7?6.4%.  If your OGTT result is 140?199 mg/dL (7.8-11 mmol/L). These blood tests may be repeated to confirm your diagnosis. How can this condition affect me? The pancreas produces a hormone (insulin) that helps to move glucose from the bloodstream into cells.  When cells in the body do not respond properly to insulin that the body makes (insulin resistance), excess glucose builds up in the blood instead of going into cells. As a result, high blood glucose (hyperglycemia) can develop, which can cause many complications. Hyperglycemia is a symptom of prediabetes. Having high blood glucose for a long time is dangerous. Too much glucose in your blood can damage your nerves and blood vessels. Long-term damage can lead to complications from diabetes, which may include:  Heart disease.  Stroke.  Blindness.  Kidney disease.  Depression.  Poor circulation in the feet and legs, which could lead to surgical removal (amputation) in severe cases. What can increase my risk? Risk factors for prediabetes include:  Having a family member with type 2 diabetes.  Being overweight or obese.  Being older than age 45.  Being of American Indian, African-American, Hispanic/Latino, or Asian/Pacific Islander descent.  Having an inactive (sedentary) lifestyle.  Having a history of heart disease.  History of gestational diabetes or polycystic ovary syndrome (PCOS), in women.  Having low levels of good cholesterol (HDL-C) or high levels of blood fats (triglycerides).  Having high blood pressure. What actions can I take to prevent diabetes?      Be physically active. ? Do moderate-intensity physical activity for 30 or more minutes on 5 or more days of the week, or as much as told by your health care provider. This could be brisk walking, biking, or water aerobics. ? Ask your health care provider what activities are safe for you. A mix of physical activities may be best, such as   walking, swimming, cycling, and strength training.  Lose weight as told by your health care provider. ? Losing 5-7% of your body weight can reverse insulin resistance. ? Your health care provider can determine how much weight loss is best for you and can help you lose weight  safely.  Follow a healthy meal plan. This includes eating lean proteins, complex carbohydrates, fresh fruits and vegetables, low-fat dairy products, and healthy fats. ? Follow instructions from your health care provider about eating or drinking restrictions. ? Make an appointment to see a diet and nutrition specialist (registered dietitian) to help you create a healthy eating plan that is right for you.  Do not smoke or use any tobacco products, such as cigarettes, chewing tobacco, and e-cigarettes. If you need help quitting, ask your health care provider.  Take over-the-counter and prescription medicines as told by your health care provider. You may be prescribed medicines that help lower the risk of type 2 diabetes.  Keep all follow-up visits as told by your health care provider. This is important. Summary  Prediabetes is the condition of having a blood sugar (blood glucose) level that is higher than it should be, but not high enough for you to be diagnosed with type 2 diabetes.  Having prediabetes puts you at risk for developing type 2 diabetes (type 2 diabetes mellitus).  To help prevent type 2 diabetes, make lifestyle changes such as being physically active and eating a healthy diet. Lose weight as told by your health care provider. This information is not intended to replace advice given to you by your health care provider. Make sure you discuss any questions you have with your health care provider. Document Revised: 10/04/2018 Document Reviewed: 08/03/2015 Elsevier Patient Education  2020 [employer]  

## 2020-04-09 ENCOUNTER — Other Ambulatory Visit (HOSPITAL_COMMUNITY): Payer: Self-pay | Admitting: Psychiatry

## 2020-04-09 ENCOUNTER — Other Ambulatory Visit: Payer: Self-pay | Admitting: Physician Assistant

## 2020-04-09 DIAGNOSIS — F331 Major depressive disorder, recurrent, moderate: Secondary | ICD-10-CM

## 2020-04-13 ENCOUNTER — Telehealth: Payer: Self-pay

## 2020-04-13 NOTE — Telephone Encounter (Signed)
Rec'd notification from CoverMyMeds that a PA was required for Omeprazole 40mg . However attempted PA on CoverMyMeds indicated the following:  [employer] requires prior authorizations to be completed by the patient's pharmacy. Pharmacy users please call [employer] at [phone] to complete this prior authorization.  Faxed information to [facility] at [phone].

## 2020-05-27 ENCOUNTER — Encounter (HOSPITAL_COMMUNITY): Payer: Self-pay | Admitting: Psychiatry

## 2020-05-27 ENCOUNTER — Other Ambulatory Visit: Payer: Self-pay

## 2020-05-27 ENCOUNTER — Telehealth (INDEPENDENT_AMBULATORY_CARE_PROVIDER_SITE_OTHER): Payer: Self-pay | Admitting: Psychiatry

## 2020-05-27 VITALS — Wt 220.0 lb

## 2020-05-27 DIAGNOSIS — F419 Anxiety disorder, unspecified: Secondary | ICD-10-CM

## 2020-05-27 DIAGNOSIS — F4310 Post-traumatic stress disorder, unspecified: Secondary | ICD-10-CM

## 2020-05-27 DIAGNOSIS — F331 Major depressive disorder, recurrent, moderate: Secondary | ICD-10-CM

## 2020-05-27 MED ORDER — MIRTAZAPINE 15 MG PO TABS: 15.0000 mg | ORAL_TABLET | Freq: Every day | ORAL | 2 refills | Status: DC

## 2020-05-27 MED ORDER — LAMOTRIGINE 200 MG PO TABS: 200.0000 mg | ORAL_TABLET | Freq: Every day | ORAL | 2 refills | Status: DC

## 2020-05-27 MED ORDER — HYDROXYZINE PAMOATE 25 MG PO CAPS: 25.0000 mg | ORAL_CAPSULE | Freq: Four times a day (QID) | ORAL | 2 refills | Status: DC | PRN

## 2020-05-27 NOTE — Progress Notes (Addendum)
Virtual Visit via Telephone Note  I connected with Harold Collins on 05/27/20 at  4:00 PM EST by telephone and verified that I am speaking with the correct person using two identifiers.  Location: Patient: Home Provider: Home Office   I discussed the limitations, risks, security and privacy concerns of performing an evaluation and management service by telephone and the availability of in person appointments. I also discussed with the patient that there may be a patient responsible charge related to this service. The patient expressed understanding and agreed to proceed.   History of Present Illness: Patient is evaluated by phone session.  He is on the phone by himself.  He is able to get a new job few weeks ago which is not that far and he is adjusting to his new job.  His shoulder pain is not as bad.  He has to work around at work so it does not bother his shoulder pain.  He feel the medicine is working and he is able to sleep better.  He had a good Thanksgiving.  Denies any anger, agitation.  He is trying to lose weight and he had lost few more pounds since the last visit.  He is happy that is sleeping good.  He takes hydroxyzine 4 times a day which helps his anxiety.  Occasionally he has nightmares and flashback but they are not as intense.  Slowly and gradually he is recovering from his knee surgery and he has more energy.  Denies any crying spells or any feeling of hopelessness or worthlessness.  His family life is good.  He is compliant with medication and reported no tremors, shakes, rash or any itching.  Past Psychiatric History: H/Odepression as a teenager. H/Ocut himself and overdosebutnoinpatient treatment.H/Osevere mood swing,night mares,anger,fights anddepression. While in [place] 10 days jaildue to assault charges.TriedPaxil,BuSpar, Lexapro, Prozac, Brintellix and Effexor. Trazodone did not help for sleep.  Seroquel caused increased triglycerides.  We did gene  testing Prozac and Lamictal are favorable.   Psychiatric Specialty Exam: Physical Exam  Review of Systems  Weight 220 lb (99.8 kg).There is no height or weight on file to calculate BMI.  General Appearance: NA  Eye Contact:  NA  Speech:  Clear and Coherent  Volume:  Decreased  Mood:  Euthymic  Affect:  NA  Thought Process:  Goal Directed  Orientation:  Full (Time, Place, and Person)  Thought Content:  WDL  Suicidal Thoughts:  No  Homicidal Thoughts:  No  Memory:  Immediate;   Good Recent;   Good Remote;   Good  Judgement:  Intact  Insight:  Present  Psychomotor Activity:  NA  Concentration:  Concentration: Good and Attention Span: Good  Recall:  Good  Fund of Knowledge:  Good  Language:  Good  Akathisia:  No  Handed:  Right  AIMS (if indicated):     Assets:  Communication Skills Desire for Improvement Housing Resilience Transportation  ADL's:  Intact  Cognition:  WNL  Sleep:   ok      Assessment and Plan: Major depressive disorder, recurrent.  PTSD.  Anxiety.  Patient doing better on current medication.  He started new job and so far things are going well.  He does not want to change medication.  Continue Remeron 15 mg at bedtime, Lamictal 200 mg daily and hydroxyzine 25 mg 3-4 times a day.  Recommended to call us back if is any question or any concern.  Follow-up 3 months.  Follow Up Instructions:    I  discussed the assessment and treatment plan with the patient. The patient was provided an opportunity to ask questions and all were answered. The patient agreed with the plan and demonstrated an understanding of the instructions.   The patient was advised to call back or seek an in-person evaluation if the symptoms worsen or if the condition fails to improve as anticipated.  I provided 18 minutes of non-face-to-face time during this encounter.   Cleotis Nipper, MD

## 2020-06-02 ENCOUNTER — Telehealth (HOSPITAL_COMMUNITY): Payer: Commercial Managed Care - PPO | Admitting: Psychiatry

## 2020-07-07 ENCOUNTER — Telehealth (HOSPITAL_COMMUNITY): Payer: Self-pay

## 2020-07-07 NOTE — Telephone Encounter (Signed)
Received a fax from [employer] requesting a PA on patient's Mirtazapine 15mg . The PA instructed me to call a number for Workman's Comp. I spoke with the patient & he stated that he's not on workman's comp and that he doesn't have insurance at this time & is good with paying for his medication for now. He plans on getting insurance soon & will let know when that happens

## 2020-07-23 ENCOUNTER — Encounter: Payer: Self-pay | Admitting: Physician Assistant

## 2020-08-25 ENCOUNTER — Other Ambulatory Visit: Payer: Self-pay

## 2020-08-25 ENCOUNTER — Encounter (HOSPITAL_COMMUNITY): Payer: Self-pay | Admitting: Psychiatry

## 2020-08-25 ENCOUNTER — Telehealth (INDEPENDENT_AMBULATORY_CARE_PROVIDER_SITE_OTHER): Payer: Self-pay | Admitting: Psychiatry

## 2020-08-25 DIAGNOSIS — F419 Anxiety disorder, unspecified: Secondary | ICD-10-CM

## 2020-08-25 DIAGNOSIS — F331 Major depressive disorder, recurrent, moderate: Secondary | ICD-10-CM

## 2020-08-25 DIAGNOSIS — F4310 Post-traumatic stress disorder, unspecified: Secondary | ICD-10-CM

## 2020-08-25 MED ORDER — HYDROXYZINE PAMOATE 25 MG PO CAPS: 25.0000 mg | ORAL_CAPSULE | Freq: Four times a day (QID) | ORAL | 2 refills | Status: DC | PRN

## 2020-08-25 MED ORDER — LAMOTRIGINE 200 MG PO TABS: 200.0000 mg | ORAL_TABLET | Freq: Every day | ORAL | 2 refills | Status: DC

## 2020-08-25 MED ORDER — MIRTAZAPINE 15 MG PO TABS: 15.0000 mg | ORAL_TABLET | Freq: Every day | ORAL | 2 refills | Status: DC

## 2020-08-25 NOTE — Progress Notes (Signed)
Virtual Visit via Telephone Note  I connected with Harold Collins on 08/25/20 at  4:00 PM EST by telephone and verified that I am speaking with the correct person using two identifiers.  Location: Patient: Home Provider: Home Office   I discussed the limitations, risks, security and privacy concerns of performing an evaluation and management service by telephone and the availability of in person appointments. I also discussed with the patient that there may be a patient responsible charge related to this service. The patient expressed understanding and agreed to proceed.   History of Present Illness: Patient is evaluated by phone session.  He is taking his medication and he reported his depression is chronic but stable.  Last week he felt more depressed but then he is feeling better now.  His job is going well.  He is trying to lose weight and he lost another 10 pounds because he is more active and keeping himself busy at work.  He takes hydroxyzine 4 times a day which helps with his anxiety.  He admitted some marital issues because of his chronic depression but no new concerns.  He is sleeping good.  He denies any paranoia, hallucination or any suicidal thoughts.  Denies any anger, aggression or any concerns or medication side effects.  He like to keep his current medication.  His shoulder pain is stable and rarely he takes Tylenol.   Past Psychiatric History: H/Odepression as a teenager. H/Ocut himself and overdosebutnoinpatient treatment.H/Osevere mood swing,night mares,anger,fights anddepression. While in [place] 10 days jaildue to assault charges.TriedPaxil,BuSpar, Lexapro, Prozac, Brintellix and Effexor. Trazodone did not help for sleep.  Seroquel caused increased triglycerides.  We did gene testing Prozac and Lamictal are favorable.   Psychiatric Specialty Exam: Physical Exam  Review of Systems  Weight 210 lb (95.3 kg).There is no height or weight on file to  calculate BMI.  General Appearance: NA  Eye Contact:  NA  Speech:  Slow  Volume:  Normal  Mood:  Euthymic  Affect:  NA  Thought Process:  Goal Directed  Orientation:  Full (Time, Place, and Person)  Thought Content:  WDL  Suicidal Thoughts:  No  Homicidal Thoughts:  No  Memory:  Immediate;   Good Recent;   Good Remote;   Good  Judgement:  Intact  Insight:  Present  Psychomotor Activity:  NA  Concentration:  Concentration: Good and Attention Span: Good  Recall:  Good  Fund of Knowledge:  Good  Language:  Good  Akathisia:  No  Handed:  Right  AIMS (if indicated):     Assets:  Communication Skills Desire for Improvement Housing Resilience Social Support Talents/Skills Transportation  ADL's:  Intact  Cognition:  WNL  Sleep:   ok, occassionally night mares.      Assessment and Plan: Major depressive disorder, recurrent.  PTSD.  Anxiety.  Patient is a stable on his chronic symptoms.  We talked about marriage counseling but patient reported that he did in the past and that did not help but he will look into that option if he needed.  At this time he does not want to change the medication since it is working well.  He has no tremors, rash, itching or any rash.  Continue mirtazapine 15 mg at bedtime, Lamictal 200 mg daily, hydroxyzine 25 mg 3-4 times a day.  Recommended to call us back if is any question or any concern.  Follow-up in 3 months.  Follow Up Instructions:    I discussed the assessment and treatment  plan with the patient. The patient was provided an opportunity to ask questions and all were answered. The patient agreed with the plan and demonstrated an understanding of the instructions.   The patient was advised to call back or seek an in-person evaluation if the symptoms worsen or if the condition fails to improve as anticipated.  I provided 12 minutes of non-face-to-face time during this encounter.   Cleotis Nipper, MD

## 2020-09-02 ENCOUNTER — Telehealth (HOSPITAL_COMMUNITY): Payer: Self-pay

## 2020-09-02 NOTE — Telephone Encounter (Signed)
Received PA's on patient's Lamotrigine 200mg  and Mirtazapine 15mg . He's been paying out of pocket for his medications due to no insurance. I called to confirm with him whether he's still doing that. He stated that he's still waiting for his insurance to start and will let know when he has it. In the meantime, he's still ok with paying for them

## 2020-10-11 ENCOUNTER — Telehealth (HOSPITAL_COMMUNITY): Payer: Self-pay | Admitting: *Deleted

## 2020-10-11 NOTE — Telephone Encounter (Signed)
Attempt made to obtain prior authorization for Mirtazapine.  Spoke with [employer]. Was told that only the pharmacy could obtain authorization.  Made call to the pharmacy.  Pharmacy to complete PA.

## 2020-11-24 ENCOUNTER — Other Ambulatory Visit: Payer: Self-pay | Admitting: Gastroenterology

## 2020-11-24 ENCOUNTER — Other Ambulatory Visit (HOSPITAL_COMMUNITY): Payer: Self-pay | Admitting: Psychiatry

## 2020-11-24 DIAGNOSIS — F331 Major depressive disorder, recurrent, moderate: Secondary | ICD-10-CM

## 2020-11-24 DIAGNOSIS — F4310 Post-traumatic stress disorder, unspecified: Secondary | ICD-10-CM

## 2020-11-25 ENCOUNTER — Encounter (HOSPITAL_COMMUNITY): Payer: Self-pay | Admitting: Psychiatry

## 2020-11-25 ENCOUNTER — Telehealth (INDEPENDENT_AMBULATORY_CARE_PROVIDER_SITE_OTHER): Payer: Self-pay | Admitting: Psychiatry

## 2020-11-25 ENCOUNTER — Other Ambulatory Visit: Payer: Self-pay

## 2020-11-25 DIAGNOSIS — F331 Major depressive disorder, recurrent, moderate: Secondary | ICD-10-CM

## 2020-11-25 DIAGNOSIS — F4310 Post-traumatic stress disorder, unspecified: Secondary | ICD-10-CM

## 2020-11-25 DIAGNOSIS — F419 Anxiety disorder, unspecified: Secondary | ICD-10-CM

## 2020-11-25 MED ORDER — HYDROXYZINE PAMOATE 50 MG PO CAPS: 50.0000 mg | ORAL_CAPSULE | Freq: Three times a day (TID) | ORAL | 2 refills | Status: DC | PRN

## 2020-11-25 MED ORDER — MIRTAZAPINE 15 MG PO TABS
15.0000 mg | ORAL_TABLET | Freq: Every day | ORAL | 2 refills | Status: DC
Start: 2020-11-25 — End: 2021-01-25

## 2020-11-25 MED ORDER — LAMOTRIGINE 200 MG PO TABS: 200.0000 mg | ORAL_TABLET | Freq: Every day | ORAL | 2 refills | Status: DC

## 2020-11-25 NOTE — Progress Notes (Signed)
Virtual Visit via Telephone Note  I connected with Harold Collins on 11/25/20 at  4:00 PM EDT by telephone and verified that I am speaking with the correct person using two identifiers.  Location: Patient: In Car Provider: Home Office   I discussed the limitations, risks, security and privacy concerns of performing an evaluation and management service by telephone and the availability of in person appointments. I also discussed with the patient that there may be a patient responsible charge related to this service. The patient expressed understanding and agreed to proceed.   History of Present Illness: Patient is evaluated by phone session.  He admitted recently got frustrated with his job and given notice but his employer convinced him to stay in the job and even gave him a promotion.  He is happy about it.  However he still feels some time chronic anxiety, dysphoria and marital issues.  He is not interested in therapy.  Lately he is having more nightmares and poor sleep because he had not picked up his mirtazapine and he has been out of medication for 5 days.  He apologized not taking his medication.  He denies any mania, psychosis, hallucination.  He is trying to lose weight but he felt that he is on plateau with his weight and not able to reduce more even though he tried.  He denies any suicidal thoughts.  He denies any anger, he has no tremor or shakes or any EPS.  He is very busy at work now but so far work is manageable.  Past Psychiatric History: H/Odepression as a teenager. H/Ocut himself and overdosebutnoinpatient treatment.H/Osevere mood swing,night mares,anger,fights anddepression. While in [place] 10 days jaildue to assault charges.TriedPaxil,BuSpar, Lexapro, Prozac, Brintellix and Effexor. Trazodone did not help for sleep. Seroquel caused increased triglycerides. We did gene testing Prozac and Lamictal are favorable.   Psychiatric Specialty Exam: Physical Exam   Review of Systems  Weight 210 lb (95.3 kg).There is no height or weight on file to calculate BMI.  General Appearance: NA  Eye Contact:  NA  Speech:  Clear and Coherent  Volume:  Normal  Mood:  Anxious  Affect:  NA  Thought Process:  Goal Directed  Orientation:  Full (Time, Place, and Person)  Thought Content:  Rumination  Suicidal Thoughts:  No  Homicidal Thoughts:  No  Memory:  Immediate;   Good Recent;   Good Remote;   Good  Judgement:  Intact  Insight:  Present  Psychomotor Activity:  NA  Concentration:  Concentration: Good and Attention Span: Good  Recall:  Good  Fund of Knowledge:  Good  Language:  Good  Akathisia:  No  Handed:  Right  Assets:  Communication Skills Desire for Improvement Financial Resources/Insurance Housing Resilience Talents/Skills Transportation  ADL's:  Intact  Cognition:  WNL  Sleep:   night mares, poor sleep      Assessment and Plan: Major depressive disorder, recurrent.  PTSD.  Anxiety.  Patient is not interested in marriage counseling.  Recommend to try higher dose of hydroxyzine to help his anxiety and sleep.  I also encouraged to keep his medication as prescribed and not to miss the dose to avoid decompensation.  So far he has no issues with the medication or having any side effects.  Continue lamotrigine 200 mg daily, mirtazapine 15 mg at bedtime and we will increase hydroxyzine 50 mg to take up to 3 times a day.  Recommended to call us back if is any question or any concern.  Follow-up  in 3 months.  Follow Up Instructions:    I discussed the assessment and treatment plan with the patient. The patient was provided an opportunity to ask questions and all were answered. The patient agreed with the plan and demonstrated an understanding of the instructions.   The patient was advised to call back or seek an in-person evaluation if the symptoms worsen or if the condition fails to improve as anticipated.  I provided 12 minutes of  non-face-to-face time during this encounter.   Cleotis Nipper, MD

## 2020-12-13 ENCOUNTER — Other Ambulatory Visit (HOSPITAL_COMMUNITY): Payer: Self-pay | Admitting: Psychiatry

## 2020-12-13 DIAGNOSIS — F4310 Post-traumatic stress disorder, unspecified: Secondary | ICD-10-CM

## 2020-12-16 ENCOUNTER — Telehealth (HOSPITAL_COMMUNITY): Payer: Self-pay

## 2020-12-16 NOTE — Telephone Encounter (Signed)
RECEIVED A PRIOR AUTHORIZATION FROM [employer] FOR PATIENT'S LAMOTRIGINE 200MG  TABLET. I SPOKE WITH THE PHARMACIST AT THE PHARMACY MEDICATION WAS SENT TO. HE STATED THAT PATIENT'S LAMOTRIGINE DOES NOT REQUIRE A PA AND THAT THE COST IS ONLY $9.00 TO THE PATIENT

## 2021-01-20 ENCOUNTER — Other Ambulatory Visit: Payer: Self-pay

## 2021-01-20 ENCOUNTER — Emergency Department (HOSPITAL_COMMUNITY)
Admission: EM | Admit: 2021-01-20 | Discharge: 2021-01-21 | Disposition: A | Discharge status: Home / Self Care | Payer: Commercial Managed Care - PPO | Source: Home / Self Care | Attending: Emergency Medicine | Admitting: Emergency Medicine

## 2021-01-20 DIAGNOSIS — T1491XA Suicide attempt, initial encounter: Secondary | ICD-10-CM

## 2021-01-20 DIAGNOSIS — Z20822 Contact with and (suspected) exposure to covid-19: Secondary | ICD-10-CM | POA: Insufficient documentation

## 2021-01-20 DIAGNOSIS — F331 Major depressive disorder, recurrent, moderate: Secondary | ICD-10-CM | POA: Insufficient documentation

## 2021-01-20 DIAGNOSIS — E1122 Type 2 diabetes mellitus with diabetic chronic kidney disease: Secondary | ICD-10-CM | POA: Insufficient documentation

## 2021-01-20 DIAGNOSIS — F332 Major depressive disorder, recurrent severe without psychotic features: Secondary | ICD-10-CM | POA: Diagnosis not present

## 2021-01-20 DIAGNOSIS — F4310 Post-traumatic stress disorder, unspecified: Secondary | ICD-10-CM | POA: Insufficient documentation

## 2021-01-20 DIAGNOSIS — N183 Chronic kidney disease, stage 3 unspecified: Secondary | ICD-10-CM | POA: Insufficient documentation

## 2021-01-20 DIAGNOSIS — F17210 Nicotine dependence, cigarettes, uncomplicated: Secondary | ICD-10-CM | POA: Insufficient documentation

## 2021-01-20 DIAGNOSIS — T450X2A Poisoning by antiallergic and antiemetic drugs, intentional self-harm, initial encounter: Secondary | ICD-10-CM | POA: Insufficient documentation

## 2021-01-20 DIAGNOSIS — T43502A Poisoning by unspecified antipsychotics and neuroleptics, intentional self-harm, initial encounter: Secondary | ICD-10-CM | POA: Insufficient documentation

## 2021-01-20 DIAGNOSIS — X838XXA Intentional self-harm by other specified means, initial encounter: Secondary | ICD-10-CM | POA: Insufficient documentation

## 2021-01-20 DIAGNOSIS — T50902A Poisoning by unspecified drugs, medicaments and biological substances, intentional self-harm, initial encounter: Secondary | ICD-10-CM

## 2021-01-20 LAB — COMPREHENSIVE METABOLIC PANEL
ALT: 23 U/L (ref 0–44)
AST: 22 U/L (ref 15–41)
Albumin: 4.6 g/dL (ref 3.5–5.0)
Alkaline Phosphatase: 61 U/L (ref 38–126)
Anion gap: 9 (ref 5–15)
BUN: 14 mg/dL (ref 6–20)
CO2: 30 mmol/L (ref 22–32)
Calcium: 9.2 mg/dL (ref 8.9–10.3)
Chloride: 100 mmol/L (ref 98–111)
Creatinine, Ser: 1.22 mg/dL (ref 0.61–1.24)
GFR, Estimated: >60 mL/min (ref >60)
Glucose, Bld: 110 mg/dL — ABNORMAL HIGH (ref 70–99)
Potassium: 3.8 mmol/L (ref 3.5–5.1)
Sodium: 139 mmol/L (ref 135–145)
Total Bilirubin: 0.5 mg/dL (ref 0.3–1.2)
Total Protein: 7.2 g/dL (ref 6.5–8.1)

## 2021-01-20 LAB — SALICYLATE LEVEL: Salicylate Lvl: <7 mg/dL — ABNORMAL LOW (ref 7.0–30.0)

## 2021-01-20 LAB — ETHANOL: Alcohol, Ethyl (B): <10 mg/dL (ref <10)

## 2021-01-20 LAB — ACETAMINOPHEN LEVEL: Acetaminophen (Tylenol), Serum: <10 ug/mL — ABNORMAL LOW (ref 10–30)

## 2021-01-20 NOTE — ED Notes (Signed)
Harold Collins would like a call back with an update her contact information is [phone]
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
## 2021-01-20 NOTE — ED Triage Notes (Signed)
Patient checked in for SI. He took too much of two medications. Hydroxyzine and mirtazaoine he probably took about 20 pills he's not sure. Patient is feeling nauseous and tired. Patient is not sure why he tried to commit suicide states its personal.

## 2021-01-21 ENCOUNTER — Inpatient Hospital Stay (HOSPITAL_COMMUNITY)
Admission: AD | Admit: 2021-01-21 | Discharge: 2021-01-25 | DRG: 885 | Disposition: A | Discharge status: Home / Self Care | Payer: Commercial Managed Care - PPO | Source: Intra-hospital | Attending: Behavioral Health | Admitting: Behavioral Health

## 2021-01-21 ENCOUNTER — Encounter (HOSPITAL_COMMUNITY): Payer: Self-pay | Admitting: Student

## 2021-01-21 ENCOUNTER — Telehealth (HOSPITAL_COMMUNITY): Payer: Self-pay | Admitting: *Deleted

## 2021-01-21 DIAGNOSIS — F413 Other mixed anxiety disorders: Secondary | ICD-10-CM | POA: Diagnosis not present

## 2021-01-21 DIAGNOSIS — Z9151 Personal history of suicidal behavior: Secondary | ICD-10-CM

## 2021-01-21 DIAGNOSIS — Z9152 Personal history of nonsuicidal self-harm: Secondary | ICD-10-CM | POA: Diagnosis not present

## 2021-01-21 DIAGNOSIS — K219 Gastro-esophageal reflux disease without esophagitis: Secondary | ICD-10-CM | POA: Diagnosis present

## 2021-01-21 DIAGNOSIS — Z79899 Other long term (current) drug therapy: Secondary | ICD-10-CM | POA: Diagnosis not present

## 2021-01-21 DIAGNOSIS — F332 Major depressive disorder, recurrent severe without psychotic features: Secondary | ICD-10-CM | POA: Diagnosis present

## 2021-01-21 DIAGNOSIS — Z20822 Contact with and (suspected) exposure to covid-19: Secondary | ICD-10-CM | POA: Diagnosis present

## 2021-01-21 DIAGNOSIS — F419 Anxiety disorder, unspecified: Secondary | ICD-10-CM | POA: Diagnosis present

## 2021-01-21 DIAGNOSIS — E785 Hyperlipidemia, unspecified: Secondary | ICD-10-CM | POA: Diagnosis present

## 2021-01-21 DIAGNOSIS — G4700 Insomnia, unspecified: Secondary | ICD-10-CM | POA: Diagnosis present

## 2021-01-21 DIAGNOSIS — F22 Delusional disorders: Secondary | ICD-10-CM | POA: Diagnosis present

## 2021-01-21 DIAGNOSIS — F17210 Nicotine dependence, cigarettes, uncomplicated: Secondary | ICD-10-CM | POA: Diagnosis present

## 2021-01-21 DIAGNOSIS — F4310 Post-traumatic stress disorder, unspecified: Secondary | ICD-10-CM | POA: Diagnosis present

## 2021-01-21 DIAGNOSIS — E559 Vitamin D deficiency, unspecified: Secondary | ICD-10-CM | POA: Diagnosis present

## 2021-01-21 DIAGNOSIS — F331 Major depressive disorder, recurrent, moderate: Secondary | ICD-10-CM

## 2021-01-21 DIAGNOSIS — Z818 Family history of other mental and behavioral disorders: Secondary | ICD-10-CM | POA: Diagnosis not present

## 2021-01-21 LAB — RAPID URINE DRUG SCREEN, HOSP PERFORMED
Amphetamines: NOT DETECTED
Barbiturates: NOT DETECTED
Benzodiazepines: NOT DETECTED
Cocaine: NOT DETECTED
Opiates: NOT DETECTED
Tetrahydrocannabinol: NOT DETECTED

## 2021-01-21 LAB — CBC WITH DIFFERENTIAL/PLATELET
Abs Immature Granulocytes: 0.07 10*3/uL (ref 0.00–0.07)
Basophils Absolute: 0 10*3/uL (ref 0.0–0.1)
Basophils Relative: 0 %
Eosinophils Absolute: 0.3 10*3/uL (ref 0.0–0.5)
Eosinophils Relative: 4 %
HCT: 43.2 % (ref 39.0–52.0)
Hemoglobin: 15.3 g/dL (ref 13.0–17.0)
Immature Granulocytes: 1 %
Lymphocytes Relative: 29 %
Lymphs Abs: 2.1 10*3/uL (ref 0.7–4.0)
MCH: 32.1 pg (ref 26.0–34.0)
MCHC: 35.4 g/dL (ref 30.0–36.0)
MCV: 90.6 fL (ref 80.0–100.0)
Monocytes Absolute: 0.7 10*3/uL (ref 0.1–1.0)
Monocytes Relative: 10 %
Neutro Abs: 4.1 10*3/uL (ref 1.7–7.7)
Neutrophils Relative %: 56 %
Platelets: 338 10*3/uL (ref 150–400)
RBC: 4.77 MIL/uL (ref 4.22–5.81)
RDW: 12.9 % (ref 11.5–15.5)
WBC: 7.3 10*3/uL (ref 4.0–10.5)
nRBC: 0 % (ref 0.0–0.2)

## 2021-01-21 LAB — RESP PANEL BY RT-PCR (FLU A&B, COVID) ARPGX2
Influenza A by PCR: NEGATIVE
Influenza B by PCR: NEGATIVE
SARS Coronavirus 2 by RT PCR: NEGATIVE

## 2021-01-21 MED ORDER — NIACIN ER (ANTIHYPERLIPIDEMIC) 500 MG PO TBCR
1000.0000 mg | EXTENDED_RELEASE_TABLET | ORAL | Status: DC
  Administered 2021-01-21 – 2021-01-25 (×8): 1000 mg via ORAL
  Filled 2021-01-21 (×12): qty 2

## 2021-01-21 MED ORDER — NIACIN ER 500 MG PO CPCR
2000.0000 mg | ORAL_CAPSULE | Freq: Every day | ORAL | Status: DC
  Filled 2021-01-21: qty 4

## 2021-01-21 MED ORDER — MAGNESIUM HYDROXIDE 400 MG/5ML PO SUSP: 30.0000 mL | Freq: Every day | ORAL | Status: DC | PRN

## 2021-01-21 MED ORDER — ACETAMINOPHEN 325 MG PO TABS: 650.0000 mg | ORAL_TABLET | Freq: Four times a day (QID) | ORAL | Status: DC | PRN

## 2021-01-21 MED ORDER — MIRTAZAPINE 15 MG PO TABS
15.0000 mg | ORAL_TABLET | Freq: Every day | ORAL | Status: DC
  Administered 2021-01-21 – 2021-01-23 (×3): 15 mg via ORAL
  Filled 2021-01-21 (×4): qty 1

## 2021-01-21 MED ORDER — LAMOTRIGINE 200 MG PO TABS
200.0000 mg | ORAL_TABLET | Freq: Every day | ORAL | Status: DC
  Administered 2021-01-21 – 2021-01-25 (×5): 200 mg via ORAL
  Filled 2021-01-21 (×6): qty 1

## 2021-01-21 MED ORDER — HYDROXYZINE HCL 25 MG PO TABS: 25.0000 mg | ORAL_TABLET | Freq: Four times a day (QID) | ORAL | Status: DC | PRN

## 2021-01-21 MED ORDER — PANTOPRAZOLE SODIUM 40 MG PO TBEC
80.0000 mg | DELAYED_RELEASE_TABLET | Freq: Every day | ORAL | Status: DC
  Administered 2021-01-21 – 2021-01-25 (×5): 80 mg via ORAL
  Filled 2021-01-21 (×7): qty 2

## 2021-01-21 MED ORDER — MELATONIN 3 MG PO TABS
3.0000 mg | ORAL_TABLET | Freq: Every evening | ORAL | Status: DC | PRN
  Filled 2021-01-21: qty 1

## 2021-01-21 MED ORDER — NIACIN ER (ANTIHYPERLIPIDEMIC) 1000 MG PO TBCR: 2000.0000 mg | EXTENDED_RELEASE_TABLET | Freq: Every day | ORAL | Status: DC

## 2021-01-21 MED ORDER — ALUM & MAG HYDROXIDE-SIMETH 200-200-20 MG/5ML PO SUSP
30.0000 mL | ORAL | Status: DC | PRN
Start: 2021-01-21 — End: 2021-01-25

## 2021-01-21 MED ORDER — NIACIN ER (ANTIHYPERLIPIDEMIC) 500 MG PO TBCR
2000.0000 mg | EXTENDED_RELEASE_TABLET | Freq: Every day | ORAL | Status: DC
  Filled 2021-01-21 (×2): qty 4
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
## 2021-01-21 NOTE — Telephone Encounter (Signed)
Yes, please call him.

## 2021-01-21 NOTE — Progress Notes (Deleted)
Recreation Therapy Notes  Date:  7.29.22 Time: 0930 Location: [address] Dayroom  Group Topic: Stress Management  Goal Area(s) Addresses:  Patient will identify positive stress management techniques. Patient will identify benefits of using stress management post d/c.  Intervention: Stress Management  Activity :  LRT played a meditation that focused on dealing with procrastination.  Patients were to listen to the meditation as it played.  Education:  Stress Management, Discharge Planning.   Education Outcome: Acknowledges Education  Clinical Observations/Feedback:  Pt did not attend group session.    Caroll Rancher, LRT/CTRS         Caroll Rancher A 01/21/2021 12:33 PM

## 2021-01-21 NOTE — ED Notes (Signed)
Per [facility] AC- Pt can go to [facility] after 8AM
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
## 2021-01-21 NOTE — ED Notes (Signed)
Patients wife would like a call back for an update on her husband.  Heather [phone]

## 2021-01-21 NOTE — Progress Notes (Signed)
   01/21/21 2113  Psych Admission Type (Psych Patients Only)  Admission Status Voluntary  Psychosocial Assessment  Patient Complaints Anxiety;Depression  Eye Contact Fair  Facial Expression Animated  Affect Anxious;Sad  Speech Logical/coherent  Interaction Assertive  Motor Activity Other (Comment) (WDL)  Appearance/Hygiene Unremarkable  Behavior Characteristics Cooperative;Appropriate to situation  Mood Pleasant  Thought Process  Coherency WDL  Content WDL  Delusions None reported or observed  Perception WDL  Hallucination None reported or observed  Judgment WDL  Confusion WDL  Danger to Self  Current suicidal ideation? Denies  Danger to Others  Danger to Others None reported or observed

## 2021-01-21 NOTE — Telephone Encounter (Signed)
Pt called to share that he overdosed last night and is currently at [facility] E.D. awaiting admission to [facility]. Dr. Jola Babinski has accepted pt. He asked that you call him however he does not have phone, of course it would be locked up. Pt says he's in room # 301 currently. I can try to talk to him if you like. FYI.

## 2021-01-21 NOTE — [facility] Suicide Risk Assessment (Signed)
[facility] Admission Suicide Risk Assessment   Nursing information obtained from:  Patient Demographic factors:  Male Current Mental Status:  Suicide plan, Suicidal ideation indicated by patient Loss Factors:  NA Historical Factors:  Prior suicide attempts Risk Reduction Factors:  Living with another person, especially a relative, Positive social support  Total Time spent with patient:  50 minutes Principal Problem: <principal problem not specified> Diagnosis:  Active Problems:   PTSD (post-traumatic stress disorder)   MDD (major depressive disorder), recurrent severe, without psychosis ([place])   Anxiety disorder, unspecified  Subjective Data: Medical record reviewed.  Patient's case discussed in detail with members of the treatment team.  I met with and evaluated the patient on the unit today.  Harold Collins is a 40 year old male with reported history of recurrent major depressive disorder, PTSD and unspecified anxiety disorder admitted from [place] ED after patient checked himself in following an impulsive suicide attempt by taking an overdose of approximately 20 tablets total of combination of hydroxyzine ($RemoveBeforeD'25mg'RzsSNqsMvDMxUe$  or $R'50mg'ek$  tablets) and mirtazapine ($RemoveBeforeDE'15mg'KhAuFTNPCFDIKLp$  tablets) on the evening of 01/20/2021 in the context of increased marital stressors.  Patient vomited upon arrival to the ED and ED notes document the patient continued to report feeling suicidal after his arrival there.  He reported having an upsetting argument with his wife prior to the impulsive overdose during which patient felt wife was "gas lighting" him and not being truthful about events and things he had seen with his own eyes.  Patient sees Dr. Adele Schilder as an outpatient for medication management and is prescribed Lamictal 200 mg daily, mirtazapine 15 mg at bedtime and hydroxyzine 50 mg 3 times daily PRN anxiety.  He does not have a current therapist.  In the ED UDS and BAL were negative.  EKG showed normal sinus rhythm, ventricular rate of 62 and  QT/QTc of 404/410.  Patient was deemed stable for inpatient psychiatric admission and was transferred to [facility] this morning for further evaluation and treatment.  During interview with me this morning, patient presents as cooperative and polite with anxious mood, stable appropriate affect and organized thought processes.  He states that he took approximately 10 tablets of Vistaril ($RemoveBefo'25mg'gxYuouqNfjT$  tabs) and approximately 5 tablets of Remeron ($RemoveBef'15mg'HvHBUdIruK$  tabs) around 9:15 PM last night in "a moment of impulse" which he immediately regretted.  Patient states that he has been arguing more with his wife over the last month and felt she was "gas lighting" him last night.  Patient reports that he has had worry on and off that his wife may be cheating on him for the past 14 years.  He states that over the past month he has become increasingly worried that she may be having an affair and he has been questioning her about her behavior and checking her texts more frequently in recent weeks to reassure himself about the status of their relationship.  He denies recent depressed mood, anhedonia, change in sleep, change in energy or change in appetite.  He denies suicidal ideation except for just prior to the impulsive overdose.  Patient does report additional stressor of having eulogized a friend a few weeks ago who died from an unintentional heroin/fentanyl overdose.  Patient is eager for discharge and wants to get back to work and prefers to address his symptoms in the outpatient setting.  He also states he is looking forward to spending time with his children after discharge.  I discussed with patient the possible addition of an SSRI to target obsessive-compulsive like symptoms of  increased obsessive thoughts about wife's possible infidelity and compulsive reassurance seeking by questioning wife and checking her text messages, etc.  Patient declines med changes and would like to defer initiation of SSRI for now and discuss with his outpatient  doctor.  We also discussed the importance of therapy/CBT for OCD-like symptoms.  Patient is receptive to referral to therapist and has not been in therapy for approximately 2 years.  Patient denies current depressed mood, anhedonia, passive wish for death, SI, AI, HI, AH, VH or PI.  He denies any history of psychotic symptoms.  Patient denies any prior inpatient psychiatric admissions.  He reports prior diagnoses of MDD, PTSD and anxiety.  He gives a history of 1 prior suicide attempt at age 58 by cutting his left arm.  He engaged in some nonsuicidal self-injurious behavior by superficial cutting as a teenager.  He denies any history of past symptoms meeting criteria for manic or hypomanic episodes.  Per chart review patient has had multiple medication trials including Paxil, BuSpar, Lexapro, Brintellix, trazodone, Seroquel and Effexor.  Per chart patient has had past Prozac trial but patient denies past Prozac trial.  Patient reports a remote history of marijuana use starting in high school until his early 96s.  He has not smoked marijuana in over 10 years.  Patient reports very infrequent alcohol use and last had a drink on January 07, 2021.  He denies any other drug use.  He smokes 1 pack of cigarettes per day.  He does not vape.  Patient reports he has a history of anxiety in both maternal and paternal relatives and a history of alcohol and drug use in both maternal and paternal relatives.  He denies other known family mental health history.  He denies family history of completed suicide.  Continued Clinical Symptoms:  Alcohol Use Disorder Identification Test Final Score (AUDIT): 0 The "Alcohol Use Disorders Identification Test", Guidelines for Use in Primary Care, Second Edition.  World Pharmacologist [facility]). Score between 0-7:  no or low risk or alcohol related problems. Score between 8-15:  moderate risk of alcohol related problems. Score between 16-19:  high risk of alcohol related  problems. Score 20 or above:  warrants further diagnostic evaluation for alcohol dependence and treatment.   CLINICAL FACTORS:   Severe Anxiety and/or Agitation Depression:   Impulsivity Obsessive-Compulsive Disorder Previous Psychiatric Diagnoses and Treatments   Musculoskeletal: Strength & Muscle Tone: within normal limits Gait & Station: normal Patient leans: N/A  Psychiatric Specialty Exam:  Presentation  General Appearance: Appropriate for Environment; Casual  Eye Contact:Good  Speech:Clear and Coherent; Normal Rate  Speech Volume:Normal  Handedness: No data recorded  Mood and Affect  Mood:Anxious  Affect:Full Range   Thought Process  Thought Processes:Coherent; Goal Directed  Descriptions of Associations:Intact  Orientation:Full (Time, Place and Person)  Thought Content:Obsessions; Logical  History of Schizophrenia/Schizoaffective disorder:No  Duration of Psychotic Symptoms:No data recorded Hallucinations:Hallucinations: None  Ideas of Reference:None  Suicidal Thoughts:Suicidal Thoughts: No  Homicidal Thoughts:Homicidal Thoughts: No   Sensorium  Memory:Immediate Good; Recent Good  Judgment:Fair  Insight:Fair   Executive Functions  Concentration:Good  Attention Span:Good  [place] of Knowledge:Good  Language:Good   Psychomotor Activity  Psychomotor Activity:Psychomotor Activity: Normal   Assets  Assets:Communication Skills; Desire for Improvement; Housing; Leisure Time; Resilience; Social Support; Vocational/Educational   Sleep  Sleep:Sleep: Good    Physical Exam: Physical Exam Vitals and nursing note reviewed.  Constitutional:      General: He is not in acute distress.  Appearance: Normal appearance. He is not diaphoretic.  HENT:     Head: Normocephalic and atraumatic.  Cardiovascular:     Rate and Rhythm: Normal rate.  Pulmonary:     Effort: Pulmonary effort is normal.  Neurological:     General:  No focal deficit present.     Mental Status: He is alert and oriented to person, place, and time.   Review of Systems  Constitutional:  Negative for chills, diaphoresis and fever.  HENT:  Negative for sore throat.   Respiratory:  Negative for cough and shortness of breath.   Cardiovascular:  Negative for chest pain and palpitations.  Gastrointestinal:  Negative for constipation, diarrhea and vomiting.  Genitourinary: Negative.   Musculoskeletal: Negative.   Skin:  Negative for rash.  Neurological:  Negative for dizziness, tremors and seizures.  Psychiatric/Behavioral:  Negative for depression, hallucinations and suicidal ideas. The patient is nervous/anxious. The patient does not have insomnia.   All other systems reviewed and are negative. Blood pressure 109/72, pulse 72, temperature 98 F (36.7 C), temperature source Oral, resp. rate 16, height $RemoveBe'5\' 11"'KtNveJhoA$  (1.803 m), weight 96 kg, SpO2 100 %. Body mass index is 29.52 kg/m.   COGNITIVE FEATURES THAT CONTRIBUTE TO RISK:  Thought constriction (tunnel vision)    SUICIDE RISK:   Moderate:  Frequent suicidal ideation with limited intensity, and duration, some specificity in terms of plans, no associated intent, good self-control, limited dysphoria/symptomatology, some risk factors present, and identifiable protective factors, including available and accessible social support.  PLAN OF CARE: The patient has been admitted to the 300 inpatient unit for further assessment, safety and treatment following impulsive overdose.  He has a history of major depressive disorder, PTSD and anxiety.  Patient appears to be having some OCD-like symptoms with increasing worry about possible infidelity on the part of his wife and compulsive reassurance seeking from wife as well as compulsive checking of her text messages, etc.  We will continue every 15-minute observation status.  Encourage participation in group therapy and therapeutic milieu.  Available lab results  reviewed.  CMP with glucose of 110 and otherwise WNL.  CBC and differential were WNL.  Acetaminophen level and salicylate level were undetectable.  Influenza A, influenza B and coronavirus testing were negative.  BAL was <10.  Urine drug screen was negative.  TSH, lipid panel and hemoglobin A1c were not drawn but have been ordered for the a.m. draw tomorrow.  EKG performed at 01/20/2021 showed normal sinus rhythm, ventricular rate of 62 and QT/QTc of 404/410.  I strongly encourage patient to consider initiation of treatment with an SSRI to target OCD-like symptoms.  Patient declines trial of Prozac or other SSRI at this time.  He would like to discuss med change with his outpatient doctor.  We will continue Lamictal 200 mg daily.  We will continue mirtazapine 15 mg at bedtime.  We will continue hydroxyzine 25 mg Q6H PRN insomnia.  See MAR.  We will prior available treatment records and obtain collateral information.  Estimated length of stay 3 to 5 days.  Patient will need referral to therapist who can do CBT (exposure/response prevention therapy) as an outpatient.  We will need follow-up appointment with Dr. Adele Schilder.  I certify that inpatient services furnished can reasonably be expected to improve the patient's condition.   Arthor Captain, MD 01/21/2021, 12:24 PM

## 2021-01-21 NOTE — Progress Notes (Signed)
Adult Psychoeducational Group Note  Date:  01/21/2021 Time:  11:16 AM  Group Topic/Focus:  Goals Group:   The focus of this group is to help patients establish daily goals to achieve during treatment and discuss how the patient can incorporate goal setting into their daily lives to aide in recovery.  Participation Level:  Active  Participation Quality:  Appropriate and Attentive  Affect:  Appropriate  Cognitive:  Appropriate  Insight: Appropriate and Good  Engagement in Group:  Engaged  Modes of Intervention:  Discussion  Additional Comments:  Pt attended and participated in morning goals group.    Harold Collins 01/21/2021, 11:16 AM

## 2021-01-21 NOTE — Progress Notes (Signed)
Patient's wife    Harold Collins   phone [phone]  came to [facility] and picked her husband's clothes in his locker.  Patient is aware and wife is approved on his form.  Wife wants to talk to SW/MD about patient's paranoid behavior.  He does NOT believe he is paranoid.  He think she is cheating on him.  He tracks phone calls.  She works for [employer].  Obsessively watches meetings she attends the last 2 months.  He was sure she was rubbing boss' leg with her foot.  He has been in cycle for the last two yrs.  He thinks she is cheating but she is  NOT.  He is getting worse.  His dad died 7 yrs ago in Dec 29, 2013.  Things have got worst now.  Patient's dad died of cancer in December 29, 2013.  Things have gotten much worse now.  Mental issues when he was younger.  No prior hospitalizations.  Together 14 yrs.  Have 68 yr old son.  Before marriage, he cut himself.  He does NOT want to be in hospital.  Anxious about missing work.  MD is Dr. Berton Mount.

## 2021-01-21 NOTE — ED Notes (Signed)
Report given to nurse at [facility] 

## 2021-01-21 NOTE — ED Notes (Signed)
[facility] called for transport needs.  

## 2021-01-21 NOTE — Tx Team (Signed)
Initial Treatment Plan 01/21/2021 11:13 AM [name] [id number]    PATIENT STRESSORS: Marital or family conflict Traumatic event   PATIENT STRENGTHS: Ability for insight Average or above average intelligence General fund of knowledge Physical Health Special hobby/interest Supportive family/friends Work skills   PATIENT IDENTIFIED PROBLEMS: "Depression"   "At risk for suicide"   "Argument with wife"                  DISCHARGE CRITERIA:  Ability to meet basic life and health needs Improved stabilization in mood, thinking, and/or behavior Verbal commitment to aftercare and medication compliance  PRELIMINARY DISCHARGE PLAN: Outpatient therapy Return to previous living arrangement Return to previous work or school arrangements  PATIENT/FAMILY INVOLVEMENT: This treatment plan has been presented to and reviewed with the patient, Harold Collins, and/or family member. The patient and family have been given the opportunity to ask questions and make suggestions.  Tyrone Apple, RN 01/21/2021, 11:13 AM

## 2021-01-21 NOTE — [facility] Group Notes (Signed)
LCSW Group Therapy Note  Type of Therapy/Topic: Group Therapy: Six Dimensions of Wellness  Participation Level: Active  Description of Group: This group will address the concept of wellness and the six concepts of wellness: occupational, physical, social, intellectual, spiritual, and emotional. Patients will be encouraged to process areas in their lives that are out of balance and identify reasons for remaining unbalanced. Patients will be encouraged to explore ways to practice healthy habits daily to attain better physical and mental health outcomes.  Therapeutic Goals: 1. Identify aspects of wellness that they are doing well. 2. Identify aspects of wellness that they would like to improve upon. 3. Identify one action they can take to improve an aspect of wellness in their lives.   Summary of Patient Progress: Pt came into group late but was appropriate and supported his peers during discussion.     Therapeutic Modalities: Cognitive Behavioral Therapy Solution-Focused Therapy Relapse Prevention   Fredirick Lathe, LCSWA Clinicial Social Worker [employer]

## 2021-01-21 NOTE — H&P (Signed)
Psychiatric Admission Assessment Adult  Patient Identification: Harold Collins MRN:  [id number] Date of Evaluation:  01/21/2021 Chief Complaint:  MDD (major depressive disorder), recurrent severe, without psychosis ([place]) [F33.2] Principal Diagnosis: Anxiety disorder, unspecified Diagnosis:  Principal Problem:   Anxiety disorder, unspecified Active Problems:   PTSD (post-traumatic stress disorder)   MDD (major depressive disorder), recurrent severe, without psychosis ([place])  History of Present Illness: Medical record reviewed.  Patient's case discussed in detail with members of the treatment team.  I met with and evaluated the patient on the unit today.  Harold Collins is a 40 year old male with reported history of recurrent major depressive disorder, PTSD and unspecified anxiety disorder admitted from [place] ED after patient checked himself in following an impulsive suicide attempt by taking an overdose of approximately 20 tablets total of combination of hydroxyzine ($RemoveBeforeD'25mg'wUPRPhLHAHLDSX$  or $R'50mg'rC$  tablets) and mirtazapine ($RemoveBeforeDE'15mg'CsvMmBGTwaVxvqX$  tablets) on the evening of 01/20/2021 in the context of increased marital stressors.  Patient vomited upon arrival to the ED and ED notes document the patient continued to report feeling suicidal after his arrival there.  He reported having an upsetting argument with his wife prior to the impulsive overdose during which patient felt wife was "gas lighting" him and not being truthful about events and things he had seen with his own eyes.  Patient sees Dr. Adele Schilder as an outpatient for medication management and is prescribed Lamictal 200 mg daily, mirtazapine 15 mg at bedtime and hydroxyzine 50 mg 3 times daily PRN anxiety.  He does not have a current therapist.  In the ED UDS and BAL were negative.  EKG showed normal sinus rhythm, ventricular rate of 62 and QT/QTc of 404/410.  Patient was deemed stable for inpatient psychiatric admission and was transferred to [facility] this morning for further evaluation  and treatment.  During interview with me this morning, patient presents as cooperative and polite with anxious mood, stable appropriate affect and organized thought processes.  He states that he took approximately 10 tablets of Vistaril ($RemoveBefo'25mg'PahVrXVcgeh$  tabs) and approximately 5 tablets of Remeron ($RemoveBef'15mg'uUMcpAnpSo$  tabs) around 9:15 PM last night in "a moment of impulse" which he immediately regretted.  Patient states that he has been arguing more with his wife over the last month and felt she was "gas lighting" him last night.  Patient reports that he has had worry on and off that his wife may be cheating on him for the past 14 years.  He states that over the past month he has become increasingly worried that she may be having an affair and he has been questioning her about her behavior and checking her texts more frequently in recent weeks to reassure himself about the status of their relationship.  He denies recent depressed mood, anhedonia, change in sleep, change in energy or change in appetite.  He denies suicidal ideation except for just prior to the impulsive overdose.  Patient does report additional stressor of having eulogized a friend a few weeks ago who died from an unintentional heroin/fentanyl overdose.  Patient is eager for discharge and wants to get back to work and prefers to address his symptoms in the outpatient setting.  He also states he is looking forward to spending time with his children after discharge.  I discussed with patient the possible addition of an SSRI to target obsessive-compulsive like symptoms of increased obsessive thoughts about wife's possible infidelity and compulsive reassurance seeking by questioning wife and checking her text messages, etc.  Patient declines med changes and would like  to defer initiation of SSRI for now and discuss with his outpatient doctor.  We also discussed the importance of therapy/CBT for OCD-like symptoms.  Patient is receptive to referral to therapist and has not been in  therapy for approximately 2 years.  Patient denies current depressed mood, anhedonia, passive wish for death, SI, AI, HI, AH, VH or PI.  He denies any history of psychotic symptoms.   Patient reports a remote history of marijuana use starting in high school until his early 34s.  He has not smoked marijuana in over 10 years.  Patient reports very infrequent alcohol use and last had a drink on January 07, 2021.  He denies any other drug use.  He smokes 1 pack of cigarettes per day.  He does not vape.   Associated Signs/Symptoms: Depression Symptoms:  hopelessness, suicidal attempt, anxiety, disturbed sleep, Duration of Depression Symptoms: Greater than two weeks  (Hypo) Manic Symptoms:  Impulsivity, Anxiety Symptoms:  Excessive Worry, Obsessive Compulsive Symptoms:   intrusive obsessive thoughts of possible infidelity of wife and compulsive checking/reassurance seeking, Psychotic Symptoms:   denies PTSD Symptoms: Has PTSD dx; denies difficulty with sxs currently Total Time spent with patient:  50 minutes  Past Psychiatric History: Patient denies any prior inpatient psychiatric admissions.  He reports prior diagnoses of MDD, PTSD and anxiety.  He gives a history of 1 prior suicide attempt at age 89 by cutting his left arm.  He engaged in some nonsuicidal self-injurious behavior by superficial cutting as a teenager.  He denies any history of past symptoms meeting criteria for manic or hypomanic episodes.  Per chart review patient has had multiple medication trials including Paxil, BuSpar, Lexapro, Brintellix, trazodone, Seroquel and Effexor.  Per chart patient has had past Prozac trial but patient denies past Prozac trial.  Is the patient at risk to self? Yes.    Has the patient been a risk to self in the past 6 months? Yes.    Has the patient been a risk to self within the distant past? Yes.    Is the patient a risk to others? No.  Has the patient been a risk to others in the past 6 months? No.   Has the patient been a risk to others within the distant past? No.   Prior Inpatient Therapy:   Prior Outpatient Therapy:    Alcohol Screening: Patient refused Alcohol Screening Tool: Yes 1. How often do you have a drink containing alcohol?: Never 2. How many drinks containing alcohol do you have on a typical day when you are drinking?: 1 or 2 3. How often do you have six or more drinks on one occasion?: Never AUDIT-C Score: 0 4. How often during the last year have you found that you were not able to stop drinking once you had started?: Never 5. How often during the last year have you failed to do what was normally expected from you because of drinking?: Never 6. How often during the last year have you needed a first drink in the morning to get yourself going after a heavy drinking session?: Never 7. How often during the last year have you had a feeling of guilt of remorse after drinking?: Never 8. How often during the last year have you been unable to remember what happened the night before because you had been drinking?: Never 9. Have you or someone else been injured as a result of your drinking?: No 10. Has a relative or friend or a doctor or another  health worker been concerned about your drinking or suggested you cut down?: No Alcohol Use Disorder Identification Test Final Score (AUDIT): 0 Substance Abuse History in the last 12 months:  No. Consequences of Substance Abuse: NA Previous Psychotropic Medications: Yes  Psychological Evaluations: Yes  Past Medical History:  Past Medical History:  Diagnosis Date   Allergy    animal   Anxiety    Barrett's esophagus    Depression    GERD (gastroesophageal reflux disease)    Major depressive disorder    PTSD (post-traumatic stress disorder)     Past Surgical History:  Procedure Laterality Date   esophageal abrasions  2017   2016 4 times   MENISCUS REPAIR Left    SHOULDER SURGERY     UPPER GASTROINTESTINAL ENDOSCOPY  2017    2016   WRIST SURGERY     Family History:  Family History  Problem Relation Age of Onset   Depression Mother    Diabetes Mother    Anxiety disorder Mother    Physical abuse Mother    Colon polyps Mother    Cancer Father        gsophageal   Depression Father    Anxiety disorder Father    Physical abuse Father    Esophageal cancer Father    Depression Sister    Anxiety disorder Sister    Physical abuse Sister    Depression Brother    ADD / ADHD Brother    Anxiety disorder Brother    Colon cancer Neg Hx    Rectal cancer Neg Hx    Stomach cancer Neg Hx    Family Psychiatric  History: Patient reports he has a history of anxiety in both maternal and paternal relatives and a history of alcohol and drug use in both maternal and paternal relatives.  He denies other known family mental health history.  He denies family history of completed suicide. Tobacco Screening: Have you used any form of tobacco in the last 30 days? (Cigarettes, Smokeless Tobacco, Cigars, and/or Pipes): Patient Refused Screening Social History:  Social History   Substance and Sexual Activity  Alcohol Use No     Social History   Substance and Sexual Activity  Drug Use No    Additional Social History:                           Allergies:  No Known Allergies Lab Results:  Results for orders placed or performed during the hospital encounter of 01/20/21 (from the past 48 hour(s))  CBC with Differential     Status: None   Collection Time: 01/20/21 11:18 PM  Result Value Ref Range   WBC 7.3 4.0 - 10.5 K/uL   RBC 4.77 4.22 - 5.81 MIL/uL   Hemoglobin 15.3 13.0 - 17.0 g/dL   HCT 43.2 39.0 - 52.0 %   MCV 90.6 80.0 - 100.0 fL   MCH 32.1 26.0 - 34.0 pg   MCHC 35.4 30.0 - 36.0 g/dL   RDW 12.9 11.5 - 15.5 %   Platelets 338 150 - 400 K/uL   nRBC 0.0 0.0 - 0.2 %   Neutrophils Relative % 56 %   Neutro Abs 4.1 1.7 - 7.7 K/uL   Lymphocytes Relative 29 %   Lymphs Abs 2.1 0.7 - 4.0 K/uL   Monocytes  Relative 10 %   Monocytes Absolute 0.7 0.1 - 1.0 K/uL   Eosinophils Relative 4 %   Eosinophils Absolute 0.3  0.0 - 0.5 K/uL   Basophils Relative 0 %   Basophils Absolute 0.0 0.0 - 0.1 K/uL   Immature Granulocytes 1 %   Abs Immature Granulocytes 0.07 0.00 - 0.07 K/uL    Comment: Performed at [facility], [place] [address] [place], [place] [address]  Comprehensive metabolic panel     Status: Abnormal   Collection Time: 01/20/21 11:18 PM  Result Value Ref Range   Sodium 139 135 - 145 mmol/L   Potassium 3.8 3.5 - 5.1 mmol/L   Chloride 100 98 - 111 mmol/L   CO2 30 22 - 32 mmol/L   Glucose, Bld 110 (H) 70 - 99 mg/dL    Comment: Glucose reference range applies only to samples taken after fasting for at least 8 hours.   BUN 14 6 - 20 mg/dL   Creatinine, Ser 1.22 0.61 - 1.24 mg/dL   Calcium 9.2 8.9 - 10.3 mg/dL   Total Protein 7.2 6.5 - 8.1 g/dL   Albumin 4.6 3.5 - 5.0 g/dL   AST 22 15 - 41 U/L   ALT 23 0 - 44 U/L   Alkaline Phosphatase 61 38 - 126 U/L   Total Bilirubin 0.5 0.3 - 1.2 mg/dL   GFR, Estimated >60 >60 mL/min    Comment: (NOTE) Calculated using the CKD-EPI Creatinine Equation (2021)    Anion gap 9 5 - 15    Comment: Performed at [facility], [place] [address] [place], [place] [address]  Ethanol     Status: None   Collection Time: 01/20/21 11:18 PM  Result Value Ref Range   Alcohol, Ethyl (B) <10 <10 mg/dL    Comment: (NOTE) Lowest detectable limit for serum alcohol is 10 mg/dL.  For medical purposes only. Performed at [facility], [place] [address], [place], [place] [address]   Salicylate level     Status: Abnormal   Collection Time: 01/20/21 11:18 PM  Result Value Ref Range   Salicylate Lvl <9.5 (L) 7.0 - 30.0 mg/dL    Comment: Performed at [facility], [place] [address], [place], [place] [address]  Acetaminophen level     Status: Abnormal   Collection Time: 01/20/21 11:18 PM  Result Value Ref  Range   Acetaminophen (Tylenol), Serum <10 (L) 10 - 30 ug/mL    Comment: (NOTE) Therapeutic concentrations vary significantly. A range of 10-30 ug/mL  may be an effective concentration for many patients. However, some  are best treated at concentrations outside of this range. Acetaminophen concentrations >150 ug/mL at 4 hours after ingestion  and >50 ug/mL at 12 hours after ingestion are often associated with  toxic reactions.  Performed at [facility], [place] [address] [place], [place] [address]   Resp Panel by RT-PCR (Flu A&B, Covid) Nasopharyngeal Swab     Status: None   Collection Time: 01/21/21  1:04 AM   Specimen: Nasopharyngeal Swab; Nasopharyngeal(NP) swabs in vial transport medium  Result Value Ref Range   SARS Coronavirus 2 by RT PCR NEGATIVE NEGATIVE    Comment: (NOTE) SARS-CoV-2 target nucleic acids are NOT DETECTED.  The SARS-CoV-2 RNA is generally detectable in upper respiratory specimens during the acute phase of infection. The lowest concentration of SARS-CoV-2 viral copies this assay can detect is 138 copies/mL. A negative result does not preclude SARS-Cov-2 infection and should not be used as the sole basis for treatment or other patient management decisions. A negative result may occur with  improper specimen collection/handling,  submission of specimen other than nasopharyngeal swab, presence of viral mutation(s) within the areas targeted by this assay, and inadequate number of viral copies(<138 copies/mL). A negative result must be combined with clinical observations, patient history, and epidemiological information. The expected result is Negative.  Fact Sheet for Patients:  EntrepreneurPulse.com.au  Fact Sheet for Healthcare Providers:  IncredibleEmployment.be  This test is no t yet approved or cleared by the [place] FDA and  has been authorized for detection and/or diagnosis of SARS-CoV-2  by FDA under an Emergency Use Authorization (EUA). This EUA will remain  in effect (meaning this test can be used) for the duration of the COVID-19 declaration under Section 564(b)(1) of the Act, 21 U.S.C.section 360bbb-3(b)(1), unless the authorization is terminated  or revoked sooner.       Influenza A by PCR NEGATIVE NEGATIVE   Influenza B by PCR NEGATIVE NEGATIVE    Comment: (NOTE) The Xpert Xpress SARS-CoV-2/FLU/RSV plus assay is intended as an aid in the diagnosis of influenza from Nasopharyngeal swab specimens and should not be used as a sole basis for treatment. Nasal washings and aspirates are unacceptable for Xpert Xpress SARS-CoV-2/FLU/RSV testing.  Fact Sheet for Patients: EntrepreneurPulse.com.au  Fact Sheet for Healthcare Providers: IncredibleEmployment.be  This test is not yet approved or cleared by the [place] FDA and has been authorized for detection and/or diagnosis of SARS-CoV-2 by FDA under an Emergency Use Authorization (EUA). This EUA will remain in effect (meaning this test can be used) for the duration of the COVID-19 declaration under Section 564(b)(1) of the Act, 21 U.S.C. section 360bbb-3(b)(1), unless the authorization is terminated or revoked.  Performed at [facility], [place] [address], [place], [place] [address]   Rapid urine drug screen (hospital performed)     Status: None   Collection Time: 01/21/21  1:08 AM  Result Value Ref Range   Opiates NONE DETECTED NONE DETECTED   Cocaine NONE DETECTED NONE DETECTED   Benzodiazepines NONE DETECTED NONE DETECTED   Amphetamines NONE DETECTED NONE DETECTED   Tetrahydrocannabinol NONE DETECTED NONE DETECTED   Barbiturates NONE DETECTED NONE DETECTED    Comment: (NOTE) DRUG SCREEN FOR MEDICAL PURPOSES ONLY.  IF CONFIRMATION IS NEEDED FOR ANY PURPOSE, NOTIFY LAB WITHIN 5 DAYS.  LOWEST DETECTABLE LIMITS FOR URINE DRUG SCREEN Drug Class                      Cutoff (ng/mL) Amphetamine and metabolites    1000 Barbiturate and metabolites    200 Benzodiazepine                 465 Tricyclics and metabolites     300 Opiates and metabolites        300 Cocaine and metabolites        300 THC                            50 Performed at [facility], [place] [address] [place], [place] [address]     Blood Alcohol level:  Lab Results  Component Value Date   ETH <10 56/81/2751    Metabolic Disorder Labs:  Lab Results  Component Value Date   HGBA1C 5.8 (H) 02/19/2020   No results found for: PROLACTIN Lab Results  Component Value Date   CHOL 130 02/19/2020   TRIG 240 (H) 02/19/2020   HDL 34 (L) 02/19/2020   CHOLHDL 3.8 02/19/2020   [place]  57 02/19/2020   LDLCALC Comment (A) 12/03/2019    Current Medications: Current Facility-Administered Medications  Medication Dose Route Frequency Provider Last Rate Last Admin   acetaminophen (TYLENOL) tablet 650 mg  650 mg Oral Q6H PRN Prescilla Sours, PA-C       alum & mag hydroxide-simeth (MAALOX/MYLANTA) 200-200-20 MG/5ML suspension 30 mL  30 mL Oral Q4H PRN Margorie John W, PA-C       hydrOXYzine (ATARAX/VISTARIL) tablet 25 mg  25 mg Oral Q6H PRN Arthor Captain, MD       lamoTRIgine (LAMICTAL) tablet 200 mg  200 mg Oral Daily Arthor Captain, MD       magnesium hydroxide (MILK OF MAGNESIA) suspension 30 mL  30 mL Oral Daily PRN Margorie John W, PA-C       mirtazapine (REMERON) tablet 15 mg  15 mg Oral QHS Arthor Captain, MD       niacin (NIASPAN) CR tablet 2,000 mg  2,000 mg Oral QHS Sharma Covert, MD       pantoprazole (PROTONIX) EC tablet 80 mg  80 mg Oral Daily Margorie John W, PA-C   80 mg at 01/21/21 1145   PTA Medications: Medications Prior to Admission  Medication Sig Dispense Refill Last Dose   Cholecalciferol 25 MCG (1000 UT) capsule Take 1,000 Units by mouth 2 (two) times daily.      hydrOXYzine (VISTARIL) 50 MG capsule Take 1 capsule (50 mg total) by  mouth 3 (three) times daily as needed for anxiety. 90 capsule 2    lamoTRIgine (LAMICTAL) 200 MG tablet Take 1 tablet (200 mg total) by mouth daily. 30 tablet 2    loratadine-pseudoephedrine (CLARITIN-D 24-HOUR) 10-240 MG 24 hr tablet Take 1 tablet by mouth daily.      mirtazapine (REMERON) 15 MG tablet Take 1 tablet (15 mg total) by mouth at bedtime. 30 tablet 2    niacin (NIASPAN) 1000 MG CR tablet Take 2,000 mg by mouth at bedtime.      omeprazole (PRILOSEC) 40 MG capsule Take 1 capsule (40 mg total) by mouth daily. You are due for a follow up appointment and an EGD. Please call to schedule. Thanks 90 capsule 0     Musculoskeletal: Strength & Muscle Tone: within normal limits Gait & Station: normal Patient leans: N/A            Psychiatric Specialty Exam:  Presentation  General Appearance: Appropriate for Environment; Casual  Eye Contact:Good  Speech:Clear and Coherent; Normal Rate  Speech Volume:Normal  Handedness: No data recorded  Mood and Affect  Mood:Anxious  Affect:Full Range   Thought Process  Thought Processes:Coherent; Goal Directed  Duration of Psychotic Symptoms: No data recorded Past Diagnosis of Schizophrenia or Psychoactive disorder: No  Descriptions of Associations:Intact  Orientation:Full (Time, Place and Person)  Thought Content:Obsessions; Logical  Hallucinations:Hallucinations: None  Ideas of Reference:None  Suicidal Thoughts:Suicidal Thoughts: No  Homicidal Thoughts:Homicidal Thoughts: No   Sensorium  Memory:Immediate Good; Recent Good  Judgment:Fair  Insight:Fair   Executive Functions  Concentration:Good  Attention Span:Good  [place] of Knowledge:Good  Language:Good   Psychomotor Activity  Psychomotor Activity:Psychomotor Activity: Normal   Assets  Assets:Communication Skills; Desire for Improvement; Housing; Leisure Time; Resilience; Social Support; Vocational/Educational   Sleep   Sleep:Sleep: Good    Physical Exam: Physical Exam Vitals and nursing note reviewed. Constitutional:      General: He is not in acute distress.    Appearance: Normal appearance. He is not diaphoretic. HENT:  Head: Normocephalic and atraumatic. Cardiovascular:    Rate and Rhythm: Normal rate. Pulmonary:    Effort: Pulmonary effort is normal. Neurological:    General: No focal deficit present.    Mental Status: He is alert and oriented to person, place, and time.   ROS Constitutional:  Negative for chills, diaphoresis and fever. HENT:  Negative for sore throat.   Respiratory:  Negative for cough and shortness of breath.   Cardiovascular:  Negative for chest pain and palpitations. Gastrointestinal:  Negative for constipation, diarrhea and vomiting. Genitourinary: Negative.   Musculoskeletal: Negative.   Skin:  Negative for rash. Neurological:  Negative for dizziness, tremors and seizures. Psychiatric/Behavioral:  Negative for depression, hallucinations and suicidal ideas. The patient is nervous/anxious. The patient does not have insomnia.   All other systems reviewed and are negative. Blood pressure 109/72, pulse 72, temperature 98 F (36.7 C), temperature source Oral, resp. rate 16, height $RemoveBe'5\' 11"'rTRuuZgVP$  (1.803 m), weight 96 kg, SpO2 100 %. Body mass index is 29.52 kg/m.  Treatment Plan Summary: Daily contact with patient to assess and evaluate symptoms and progress in treatment and Medication management  Observation Level/Precautions:  15 minute checks  Laboratory:  CBC Chemistry Profile HbAIC UDS Lipid panel, TSH Available lab results reviewed.  CMP with glucose of 110 and otherwise WNL.  CBC and differential were WNL.  Acetaminophen level and salicylate level were undetectable.  Influenza A, influenza B and coronavirus testing were negative.  BAL was <10.  Urine drug screen was negative.  TSH, lipid panel and hemoglobin A1c were not drawn but have been ordered for the a.m.  draw tomorrow.    EKG performed at 01/20/2021 showed normal sinus rhythm, ventricular rate of 62 and QT/QTc of 404/410.  Psychotherapy: Encourage participation in group therapy and therapeutic milieu.  Advised participation in outpatient CBT (exposure/response prevention therapy)  Medications:  I strongly encouraged patient to consider initiation of treatment with an SSRI to target OCD-like symptoms.  Patient declines trial of Prozac or other SSRI at this time.  He would like to discuss med change with his outpatient doctor.  We will continue Lamictal 200 mg daily.  We will continue mirtazapine 15 mg at bedtime.  We will continue hydroxyzine 25 mg Q6H PRN insomnia.  See MAR.  We will prior available treatment records and obtain collateral information.  Consultations:    Discharge Concerns:    Estimated LOS: 3 to 5 days  Other:     Physician Treatment Plan for Primary Diagnosis: Anxiety disorder, unspecified Long Term Goal(s): Improvement in symptoms so as ready for discharge  Short Term Goals: Ability to identify changes in lifestyle to reduce recurrence of condition will improve, Ability to verbalize feelings will improve, Ability to disclose and discuss suicidal ideas, Ability to demonstrate self-control will improve, Ability to identify and develop effective coping behaviors will improve, Ability to maintain clinical measurements within normal limits will improve, Compliance with prescribed medications will improve, and Ability to identify triggers associated with substance abuse/mental health issues will improve  Physician Treatment Plan for Secondary Diagnosis: Principal Problem:   Anxiety disorder, unspecified Active Problems:   PTSD (post-traumatic stress disorder)   MDD (major depressive disorder), recurrent severe, without psychosis ([place])  Long Term Goal(s): Improvement in symptoms so as ready for discharge  Short Term Goals: Ability to identify changes in lifestyle to reduce  recurrence of condition will improve, Ability to verbalize feelings will improve, Ability to disclose and discuss suicidal ideas, Ability to demonstrate self-control will improve,  Ability to identify and develop effective coping behaviors will improve, Ability to maintain clinical measurements within normal limits will improve, Compliance with prescribed medications will improve, and Ability to identify triggers associated with substance abuse/mental health issues will improve  I certify that inpatient services furnished can reasonably be expected to improve the patient's condition.    Arthor Captain, MD 7/29/20221:00 PM

## 2021-01-21 NOTE — Telephone Encounter (Signed)
Writer was unable to speak to pt directly as he is in process of being admitted to [facility]. Herbert Seta, spouse, is his contact person so this nurse spoke with her. Pt had contacted her briefly earlier. Writer advised that pt may call office if needed but that Dr. Lolly Mustache will not being managing his medications during inpt treatment. Herbert Seta was encouraged to let pt know we called.

## 2021-01-21 NOTE — ED Notes (Signed)
Attempted to call report to [facility]. [facility] receptionist states "all nurses are passing meds at the moment. A nurse will call you back shortly."

## 2021-01-21 NOTE — ED Notes (Signed)
Pt dressed and changed into purple scrubs and yellow socks. Pt's shirt, shorts, pair of pants, shoes, keys, wallet, and cell phone was placed in a pt belongings bag.
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
## 2021-01-22 LAB — LDL CHOLESTEROL, DIRECT: Direct LDL: 95.7 mg/dL (ref 0–99)

## 2021-01-22 LAB — LIPID PANEL
Cholesterol: 238 mg/dL — ABNORMAL HIGH (ref 0–200)
HDL: 25 mg/dL — ABNORMAL LOW (ref >40)
LDL Cholesterol: UNDETERMINED mg/dL (ref 0–99)
Total CHOL/HDL Ratio: 9.5 RATIO
Triglycerides: 556 mg/dL — ABNORMAL HIGH (ref <150)
VLDL: UNDETERMINED mg/dL (ref 0–40)

## 2021-01-22 LAB — TSH: TSH: 1.378 u[IU]/mL (ref 0.350–4.500)

## 2021-01-22 LAB — GLUCOSE, CAPILLARY: Glucose-Capillary: 93 mg/dL (ref 70–99)

## 2021-01-22 NOTE — Progress Notes (Signed)
[facility]  01/22/2021 11:00 AM Ova Freshwaterric Cradle  MRN:  [id number]  Subjective: Minerva Areolaric reports, "I'm doing well. I have no symptoms of depression or anxiety. I'm just anxious to get discharged because I have a lot to do at home. I slept well last night. Daily notes: Minerva Areolaric is seen, chart reviewed. The chart findings discussed with the treatment team. He presents alert, oriented & aware of situation. He is visible on the unit, attending group sessions. He presents with an improved affect, good eye contact & verbally responsive. He says he is doing well today. He says he has no symptoms of depression, just anxious about getting discharged. He states that he regretted his behavior of attempting to overdose when he got upset with his wife the other day by not utilizing his learned coping skills. He says he felt like his wife was not coming up with the truth or reason why he bought a gift for her boss without his knowledge. He says he should have let it be, allow the matter to settle prior to reacting the way he did. He says he has been communicating with his wife since being in the hospital. Patient feels he is ready to be discharged as he has so many things to do at home. He also states that as soon as he impulsively took those pills, he realized that he wanted to be alive & remain alive, and that was the reason he drove himself to the hospital. He says although his stomach was never pumped at the hospital, he did throw-up most of those pills. He currently denies any SIHI, AVH, delusional thoughts or paranoia. He does not appear to be responding to any internal stimuli. He is taking & tolerating his treatment regimen. Denies any side effects. Minerva Areolaric denies any SIHI, AVH, delusional thoughts or paranoia. He does not appear to be responding to any internal stimuli. Support & encouragement provided.  Reason for admission: Ova Freshwaterric Szostak is a 40 year old male with reported history of recurrent major depressive  disorder, PTSD and unspecified anxiety disorder admitted from [facility] ED after patient checked himself in following an impulsive suicide attempt by taking an overdose of approximately 20 tablets total of combination of hydroxyzine (25mg  or 50mg  tablets) and mirtazapine (15mg  tablets) on the evening of 01/20/2021 in the context of increased marital stressors.  Patient vomited upon arrival to the ED and ED notes document the patient continued to report feeling suicidal after his arrival there.  He reported having an upsetting argument with his wife prior to the impulsive overdose during which patient felt wife was "gas lighting" him and not being truthful about events and things he had seen with his own eyes  Principal Problem: Severe episode of recurrent major depressive disorder, without psychotic features (HCC)  Diagnosis: Principal Problem:   Severe episode of recurrent major depressive disorder, without psychotic features (HCC) Active Problems:   PTSD (post-traumatic stress disorder)   MDD (major depressive disorder), recurrent severe, without psychosis (HCC)   Anxiety disorder, unspecified  Total Time spent with patient:  25 minutes  Past Psychiatric History: See H&P  Past Medical History:  Past Medical History:  Diagnosis Date   Allergy    animal   Anxiety    Barrett's esophagus    Depression    GERD (gastroesophageal reflux disease)    Major depressive disorder    PTSD (post-traumatic stress disorder)     Past Surgical History:  Procedure Laterality Date   esophageal abrasions  2017  2016 4 times   MENISCUS REPAIR Left    SHOULDER SURGERY     UPPER GASTROINTESTINAL ENDOSCOPY  2017   2016   WRIST SURGERY     Family History:  Family History  Problem Relation Age of Onset   Depression Mother    Diabetes Mother    Anxiety disorder Mother    Physical abuse Mother    Colon polyps Mother    Cancer Father        gsophageal   Depression Father    Anxiety disorder  Father    Physical abuse Father    Esophageal cancer Father    Depression Sister    Anxiety disorder Sister    Physical abuse Sister    Depression Brother    ADD / ADHD Brother    Anxiety disorder Brother    Colon cancer Neg Hx    Rectal cancer Neg Hx    Stomach cancer Neg Hx    Family Psychiatric  History: See H&P Social History:  Social History   Substance and Sexual Activity  Alcohol Use No     Social History   Substance and Sexual Activity  Drug Use No    Social History   Socioeconomic History   Marital status: Married    Spouse name: Not on file   Number of children: 2   Years of education: Not on file   Highest education level: High school graduate  Occupational History   Not on file  Tobacco Use   Smoking status: Every Day    Packs/day: 1.00    Types: Cigarettes   Smokeless tobacco: Never  Vaping Use   Vaping Use: Former   Quit date: 04/26/2017  Substance and Sexual Activity   Alcohol use: No   Drug use: No   Sexual activity: Yes    Partners: Female    Birth control/protection: None  Other Topics Concern   Not on file  Social History Narrative   Not on file   Social Determinants of Health   Financial Resource Strain: Not on file  Food Insecurity: Not on file  Transportation Needs: Not on file  Physical Activity: Not on file  Stress: Not on file  Social Connections: Not on file   Additional Social History:    Sleep: Good  Appetite:  Good  Current Medications: Current Facility-Administered Medications  Medication Dose Route Frequency Provider Last Rate Last Admin   acetaminophen (TYLENOL) tablet 650 mg  650 mg Oral Q6H PRN Jaclyn Shaggy, PA-C       alum & mag hydroxide-simeth (MAALOX/MYLANTA) 200-200-20 MG/5ML suspension 30 mL  30 mL Oral Q4H PRN Melbourne Abts W, PA-C       hydrOXYzine (ATARAX/VISTARIL) tablet 25 mg  25 mg Oral Q6H PRN Claudie Revering, MD       lamoTRIgine (LAMICTAL) tablet 200 mg  200 mg Oral Daily Claudie Revering, MD    200 mg at 01/22/21 0803   magnesium hydroxide (MILK OF MAGNESIA) suspension 30 mL  30 mL Oral Daily PRN Melbourne Abts W, PA-C       melatonin tablet 3 mg  3 mg Oral QHS PRN Claudie Revering, MD       mirtazapine (REMERON) tablet 15 mg  15 mg Oral QHS Claudie Revering, MD   15 mg at 01/21/21 2113   niacin (NIASPAN) CR tablet 1,000 mg  1,000 mg Oral BH-qamhs Antonieta Pert, MD   1,000 mg at 01/22/21 0900   pantoprazole (  PROTONIX) EC tablet 80 mg  80 mg Oral Daily Jaclyn Shaggy, PA-C   80 mg at 01/22/21 4403   Lab Results:  Results for orders placed or performed during the hospital encounter of 01/21/21 (from the past 48 hour(s))  Lipid panel     Status: Abnormal   Collection Time: 01/22/21  6:16 AM  Result Value Ref Range   Cholesterol 238 (H) 0 - 200 mg/dL   Triglycerides 474 (H) <150 mg/dL   HDL 25 (L) >25 mg/dL   Total CHOL/HDL Ratio 9.5 RATIO   VLDL UNABLE TO CALCULATE IF TRIGLYCERIDE OVER 400 mg/dL 0 - 40 mg/dL   LDL Cholesterol UNABLE TO CALCULATE IF TRIGLYCERIDE OVER 400 mg/dL 0 - 99 mg/dL    Comment:        Total Cholesterol/HDL:CHD Risk Coronary Heart Disease Risk Table                     Men   Women  1/2 Average Risk   3.4   3.3  Average Risk       5.0   4.4  2 X Average Risk   9.6   7.1  3 X Average Risk  23.4   11.0        Use the calculated Patient Ratio above and the CHD Risk Table to determine the patient's CHD Risk.        ATP III CLASSIFICATION (LDL):  <100     mg/dL   Optimal  956-387  mg/dL   Near or Above                    Optimal  130-159  mg/dL   Borderline  564-332  mg/dL   High  >951     mg/dL   Very High Performed at [facility], [address] [place], [place] [address]   TSH     Status: None   Collection Time: 01/22/21  6:16 AM  Result Value Ref Range   TSH 1.378 0.350 - 4.500 uIU/mL    Comment: Performed by a 3rd Generation assay with a functional sensitivity of <=0.01 uIU/mL. Performed at [facility],  [address], [place], [place] [address]   LDL cholesterol, direct     Status: None (Preliminary result)   Collection Time: 01/22/21  6:16 AM  Result Value Ref Range   Direct LDL 95.7 0 - 99 mg/dL    Comment: Performed at [facility], [address] [place], [place] [address]   Blood Alcohol level:  Lab Results  Component Value Date   ETH <10 01/20/2021   Metabolic Disorder Labs: Lab Results  Component Value Date   HGBA1C 5.8 (H) 02/19/2020   No results found for: PROLACTIN Lab Results  Component Value Date   CHOL 238 (H) 01/22/2021   TRIG 556 (H) 01/22/2021   HDL 25 (L) 01/22/2021   CHOLHDL 9.5 01/22/2021   VLDL UNABLE TO CALCULATE IF TRIGLYCERIDE OVER 400 mg/dL 93/23/5573   LDLCALC UNABLE TO CALCULATE IF TRIGLYCERIDE OVER 400 mg/dL 22/07/5425   LDLCALC 57 02/19/2020   Physical Findings: AIMS:  , ,  ,  ,    CIWA:    COWS:     Musculoskeletal: Strength & Muscle Tone: within normal limits Gait & Station: normal Patient leans: N/A  Psychiatric Specialty Exam:  Presentation  General Appearance: Appropriate for Environment; Casual; Fairly Groomed  Eye Contact:Good  Speech:Clear and Coherent; Normal Rate  Speech Volume:Normal  Handedness:Right  Mood and Affect  Mood:Anxious (Currently denies any symptoms of depression, but anxious abot getting discharged.)  Affect: Appropriate; Congruent  Thought Process  Thought Processes:Coherent; Goal Directed; Linear  Descriptions of Associations:Intact  Orientation:Full (Time, Place and Person)  Thought Content:Logical  History of Schizophrenia/Schizoaffective disorder:No  Duration of Psychotic Symptoms:No data recorded Hallucinations:Hallucinations: None  Ideas of Reference:None  Suicidal Thoughts:Suicidal Thoughts: No  Homicidal Thoughts:Homicidal Thoughts: No  Sensorium  Memory:Immediate Good; Recent Good; Remote Good  Judgment:Fair  Insight:Fair  Executive Functions   Concentration:Good  Attention Span:Good  Recall:Good  Fund of Knowledge:Fair  Language:Good  Psychomotor Activity  Psychomotor Activity:Psychomotor Activity: Normal  Assets  Assets:Communication Skills; Desire for Improvement; Housing; Physical Health; Resilience; Social Support; Talents/Skills  Sleep  Sleep:Sleep: Good (Per patient's reports.) Number of Hours of Sleep: 6.75  Physical Exam: Physical Exam Vitals and nursing note reviewed.  HENT:     Head: Normocephalic.     Nose: Nose normal.     Mouth/Throat:     Pharynx: Oropharynx is clear.  Eyes:     Pupils: Pupils are equal, round, and reactive to light.  Cardiovascular:     Rate and Rhythm: Normal rate.     Pulses: Normal pulses.  Pulmonary:     Effort: Pulmonary effort is normal.  Genitourinary:    Comments: Deferred Musculoskeletal:        General: Normal range of motion.     Cervical back: Normal range of motion.  Skin:    General: Skin is warm and dry.  Neurological:     General: No focal deficit present.     Mental Status: He is alert and oriented to person, place, and time.  Psychiatric:        Attention and Perception: Attention and perception normal.        Mood and Affect: Mood is anxious (about getting discharged). Mood is not depressed or elated. Affect is not labile, blunt, flat, angry, tearful or inappropriate.        Speech: Speech normal.        Behavior: Behavior normal. Behavior is not agitated, slowed, aggressive, withdrawn, hyperactive or combative. Behavior is cooperative.        Thought Content: Thought content normal. Thought content is not paranoid or delusional. Thought content does not include homicidal or suicidal ideation. Thought content does not include homicidal or suicidal plan.        Cognition and Memory: Cognition and memory normal. Cognition is not impaired. Memory is not impaired. He does not exhibit impaired recent memory or impaired remote memory.        Judgment:  Judgment is impulsive (Hx of impulsive behavior.). Judgment is not inappropriate.   Review of Systems  Constitutional:  Negative for chills, diaphoresis and malaise/fatigue.  HENT:  Negative for congestion and sore throat.   Eyes:  Negative for blurred vision.  Respiratory:  Negative for cough, shortness of breath and wheezing.   Cardiovascular:  Negative for chest pain and palpitations.  Gastrointestinal:  Negative for abdominal pain, constipation, diarrhea, heartburn, nausea and vomiting.  Genitourinary:  Negative for dysuria.  Musculoskeletal:  Negative for joint pain and myalgias.  Skin: Negative.   Neurological:  Negative for dizziness, tingling, tremors, sensory change, speech change, focal weakness, seizures, loss of consciousness, weakness and headaches.  Endo/Heme/Allergies:        Allergies: NKDA  Psychiatric/Behavioral:  Positive for depression ("Improving"). Negative for hallucinations, memory loss, substance abuse and suicidal ideas. The patient is not  nervous/anxious and does not have insomnia.   Blood pressure 109/72, pulse 72, temperature 98 F (36.7 C), temperature source Oral, resp. rate 16, height  (1.803 m), weight 96 kg, SpO2 100 %. Body mass index is 29.52 kg/m.  Treatment Plan Summary: Daily contact with patient to assess and evaluate symptoms and progress in treatment and Medication management.   Continue inpatient hospitalization. Will continue today 01/22/2021 plan as below except where it is noted.   Depression/mood stabilization. Continue Mirtazapine 15 mg po Q bedtime. Continue Lamictal 200 mg po daily.   Anxiety. Continue Vistaril 25 mg po Q 6 hours prn.   Insomnia/depression. Continue Mirtazapine 15 mg po Q bedtime.  Continue Melatonin 3 mg po Q hs.   Other medical issues. Continue Protonix 80 mg po Q daily for GERD. Continue niacin 2,000 mg po Q bedtime for hyperlipidemia. POCT monitoring routinely Q mornings for blood glucose  monitoring. Labs to be obtained: Hgba1c, TSH & Lipid panel: results pending.  Encourage group participation.  Discharge disposition plan is ongoing.  Armandina Stammer, NP, pmhnp, fnp-bc 01/22/2021, 11:00 AM
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
## 2021-01-22 NOTE — Progress Notes (Signed)
   01/22/21 2301  Psych Admission Type (Psych Patients Only)  Admission Status Voluntary  Psychosocial Assessment  Patient Complaints None  Eye Contact Fair  Facial Expression Animated  Affect Appropriate to circumstance  Speech Logical/coherent  Interaction Assertive  Motor Activity Other (Comment) (WDL)  Appearance/Hygiene Unremarkable  Behavior Characteristics Appropriate to situation  Mood Pleasant  Thought Process  Coherency WDL  Content WDL  Delusions None reported or observed  Perception WDL  Hallucination None reported or observed  Judgment Poor  Confusion None  Danger to Self  Current suicidal ideation? Denies  Danger to Others  Danger to Others None reported or observed

## 2021-01-22 NOTE — Progress Notes (Signed)
Adult Psychoeducational Group Note  Date:  01/22/2021 Time:  9:55 AM  Group Topic/Focus:  Goals Group:   The focus of this group is to help patients establish daily goals to achieve during treatment and discuss how the patient can incorporate goal setting into their daily lives to aide in recovery.  Participation Level:  Active  Participation Quality:  Appropriate and Attentive  Affect:  Appropriate  Cognitive:  Appropriate  Insight: Appropriate and Good  Engagement in Group:  Engaged  Modes of Intervention:  Discussion  Additional Comments:  Pt attended and participated in morning goals group.  He stated he goal was to find out discharge plan.  Ames Coupe 01/22/2021, 9:55 AM

## 2021-01-22 NOTE — [facility] Suicide Risk Assessment (Signed)
[facility] INPATIENT:  Family/Significant Other Suicide Prevention Education  Suicide Prevention Education:  Patient Refusal for Family/Significant Other Suicide Prevention Education: The patient Harold Collins has refused to provide written consent for family/significant other to be provided Family/Significant Other Suicide Prevention Education during admission and/or prior to discharge.  Physician notified.  Ramirez Fullbright R 01/22/2021, 2:02 PM

## 2021-01-22 NOTE — [facility] Group Notes (Signed)
LCSW Group Therapy Note  01/22/2021   10:00-11:00am   Type of Therapy and Topic:  Group Therapy: Anger Cues and Responses  Participation Level:  Active   Description of Group:   In this group, patients learned how to recognize the physical, cognitive, emotional, and behavioral responses they have to anger-provoking situations.  They identified a recent time they became angry and how they reacted.  They analyzed how their reaction was possibly beneficial and how it was possibly unhelpful.  The group discussed a variety of healthier coping skills that could help with such a situation in the future.  Focus was placed on how helpful it is to recognize the underlying emotions to our anger, because working on those can lead to a more permanent solution as well as our ability to focus on the important rather than the urgent.  Therapeutic Goals: Patients will remember their last incident of anger and how they felt emotionally and physically, what their thoughts were at the time, and how they behaved. Patients will identify how their behavior at that time worked for them, as well as how it worked against them. Patients will explore possible new behaviors to use in future anger situations. Patients will learn that anger itself is normal and cannot be eliminated, and that healthier reactions can assist with resolving conflict rather than worsening situations.  Summary of Patient Progress:  The patient shared that his most recent time of anger was earlier this month and said he had gone to [place] to bury his best friend who died of an overdose in 06-04-2020.  He was angry at the friend for overdosing and has been angry for all these months but they could not bury him until now because of the frozen ground.  He was asked by the father to do the best friend's eulogy and he dealt with his anger by letting it go and focusing on other people's pain.  The patient contributed many times to group in an appropriate  manner, remaining on topic.  Therapeutic Modalities:   Cognitive Behavioral Therapy  Lynnell Chad

## 2021-01-22 NOTE — [facility] Group Notes (Signed)
Pt attended the Meditation group and stayed the duration. He stated that he enjoyed it and it would be something he will consider continuing in the future. 

## 2021-01-22 NOTE — Progress Notes (Signed)
The patient rated his day as a 9 out of 10. He complained that the unit was disorganized around visitation. His goal for tomorrow is to go home.

## 2021-01-22 NOTE — Progress Notes (Signed)
   01/22/21 1200  Psych Admission Type (Psych Patients Only)  Admission Status Voluntary  Psychosocial Assessment  Patient Complaints None  Eye Contact Fair  Facial Expression Animated  Affect Anxious;Sad  Speech Logical/coherent  Interaction Assertive  Motor Activity Other (Comment) (WDL)  Appearance/Hygiene Unremarkable  Behavior Characteristics Cooperative;Appropriate to situation  Mood Pleasant  Thought Process  Coherency WDL  Content WDL  Delusions None reported or observed  Perception WDL  Hallucination None reported or observed  Judgment WDL  Confusion WDL  Danger to Self  Current suicidal ideation? Denies  Danger to Others  Danger to Others None reported or observed

## 2021-01-23 LAB — GLUCOSE, CAPILLARY: Glucose-Capillary: 101 mg/dL — ABNORMAL HIGH (ref 70–99)

## 2021-01-23 NOTE — Progress Notes (Signed)
[facility] MD Progress Note  01/23/2021 12:41 PM Harold Collins  MRN:  [id number]  Subjective: Harold Collins reports, "I'm frustrated for not being discharge today again. I came here voluntarily, been here for 48 hours already plus the time I spent at the other hospital ED. I'm feeling well. I'm not not depressed, but anxious to get out of here. I have got things to do. I have a home. 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 I should be able to leave. This place feels like prison to me & the other patients in this building are complaining about feeling like a prisoner here. I'm not depressed, just irritated". Daily notes: Harold Collins is seen, chart reviewed. The chart findings discussed with the treatment team. He presents alert, oriented & aware of situation. He is visible on the unit, but refused to attend group sessions today because he says he is frustrated for not getting discharged today.  He presents feeling irritated & frustrated. His affect is restricted. He is making a fair  eye contact & verbally responsive. He says he is doing okay, but frustrated. He says he has no symptoms of depression, just anxious about getting discharged. He states that he regretted his behavior of attempting to overdose when he got upset with his wife the other day by not utilizing his learned coping skills. He says he felt like his wife was not coming up with the truth or reason why she bought a gift for her boss without his knowledge. He says he should have let it be, allow the matter to settle prior to reacting the way he did. He says he has been communicating with his wife since being in the hospital. Patient feels he is ready to be discharged as he has so many things to do at home. He also states that as soon as he impulsively took those pills, he realized that he wanted to be alive & remain alive, and that was the reason he drove himself to the hospital. He says although his stomach was never pumped at the  hospital, he did throw-up most of those pills. He currently denies any SIHI, AVH, delusional thoughts or paranoia. He does not appear to be responding to any internal stimuli. He is taking & tolerating his treatment regimen. Denies any side effects. Harold Collins denies any SIHI, AVH, delusional thoughts or paranoia. He does not appear to be responding to any internal stimuli. Support & encouragement provided. Harold Collins is informed that prior to discharge, we are going to call his outpatient provider to assure he has a post-hospitalization follow-up appointment prior to discharge. He says he will rather stay in bed in his room & not attend group sessions until he is discharged. He does not appear to be in any apparent distress, just irritated.  Reason for admission: Harold Collins is a 40 year old male with reported history of recurrent major depressive disorder, PTSD and unspecified anxiety disorder admitted from [facility] ED after patient checked himself in following an impulsive suicide attempt by taking an overdose of approximately 20 tablets total of combination of hydroxyzine (  or  tablets) and mirtazapine (  tablets) on the evening of 01/20/2021 in the context of increased marital stressors.  Patient vomited upon arrival to the ED and ED notes document the patient continued to report feeling suicidal after his arrival there.  He reported having an upsetting argument with his wife prior to the impulsive overdose during which patient felt wife was "gas  lighting" him and not being truthful about events and things he had seen with his own eyes  Principal Problem: Severe episode of recurrent major depressive disorder, without psychotic features (HCC)  Diagnosis: Principal Problem:   Severe episode of recurrent major depressive disorder, without psychotic features (HCC) Active Problems:   PTSD (post-traumatic stress disorder)   MDD (major depressive disorder), recurrent severe, without psychosis (HCC)    Anxiety disorder, unspecified  Total Time spent with patient: 15 minutes  Past Psychiatric History: See H&P  Past Medical History:  Past Medical History:  Diagnosis Date   Allergy    animal   Anxiety    Barrett's esophagus    Depression    GERD (gastroesophageal reflux disease)    Major depressive disorder    PTSD (post-traumatic stress disorder)     Past Surgical History:  Procedure Laterality Date   esophageal abrasions  2017   2016 4 times   MENISCUS REPAIR Left    SHOULDER SURGERY     UPPER GASTROINTESTINAL ENDOSCOPY  2017   2016   WRIST SURGERY     Family History:  Family History  Problem Relation Age of Onset   Depression Mother    Diabetes Mother    Anxiety disorder Mother    Physical abuse Mother    Colon polyps Mother    Cancer Father        gsophageal   Depression Father    Anxiety disorder Father    Physical abuse Father    Esophageal cancer Father    Depression Sister    Anxiety disorder Sister    Physical abuse Sister    Depression Brother    ADD / ADHD Brother    Anxiety disorder Brother    Colon cancer Neg Hx    Rectal cancer Neg Hx    Stomach cancer Neg Hx    Family Psychiatric  History: See H&P Social History:  Social History   Substance and Sexual Activity  Alcohol Use No     Social History   Substance and Sexual Activity  Drug Use No    Social History   Socioeconomic History   Marital status: Married    Spouse name: Not on file   Number of children: 2   Years of education: Not on file   Highest education level: High school graduate  Occupational History   Not on file  Tobacco Use   Smoking status: Every Day    Packs/day: 1.00    Types: Cigarettes   Smokeless tobacco: Never  Vaping Use   Vaping Use: Former   Quit date: 04/26/2017  Substance and Sexual Activity   Alcohol use: No   Drug use: No   Sexual activity: Yes    Partners: Female    Birth control/protection: None  Other Topics Concern   Not on file  Social  History Narrative   Not on file   Social Determinants of Health   Financial Resource Strain: Not on file  Food Insecurity: Not on file  Transportation Needs: Not on file  Physical Activity: Not on file  Stress: Not on file  Social Connections: Not on file   Additional Social History:    Sleep: Good  Appetite:  Good  Current Medications: Current Facility-Administered Medications  Medication Dose Route Frequency Provider Last Rate Last Admin   acetaminophen (TYLENOL) tablet 650 mg  650 mg Oral Q6H PRN Jaclyn Shaggy, PA-C       alum & mag hydroxide-simeth (MAALOX/MYLANTA) 200-200-20 MG/5ML  suspension 30 mL  30 mL Oral Q4H PRN Melbourne Abts W, PA-C       hydrOXYzine (ATARAX/VISTARIL) tablet 25 mg  25 mg Oral Q6H PRN Claudie Revering, MD       lamoTRIgine (LAMICTAL) tablet 200 mg  200 mg Oral Daily Claudie Revering, MD   200 mg at 01/23/21 0804   magnesium hydroxide (MILK OF MAGNESIA) suspension 30 mL  30 mL Oral Daily PRN Melbourne Abts W, PA-C       melatonin tablet 3 mg  3 mg Oral QHS PRN Claudie Revering, MD       mirtazapine (REMERON) tablet 15 mg  15 mg Oral QHS Claudie Revering, MD   15 mg at 01/22/21 2143   niacin (NIASPAN) CR tablet 1,000 mg  1,000 mg Oral BH-qamhs Antonieta Pert, MD   1,000 mg at 01/23/21 0804   pantoprazole (PROTONIX) EC tablet 80 mg  80 mg Oral Daily Melbourne Abts W, PA-C   80 mg at 01/23/21 4034   Lab Results:  Results for orders placed or performed during the hospital encounter of 01/21/21 (from the past 48 hour(s))  Lipid panel     Status: Abnormal   Collection Time: 01/22/21  6:16 AM  Result Value Ref Range   Cholesterol 238 (H) 0 - 200 mg/dL   Triglycerides 742 (H) <150 mg/dL   HDL 25 (L) >59 mg/dL   Total CHOL/HDL Ratio 9.5 RATIO   VLDL UNABLE TO CALCULATE IF TRIGLYCERIDE OVER 400 mg/dL 0 - 40 mg/dL   LDL Cholesterol UNABLE TO CALCULATE IF TRIGLYCERIDE OVER 400 mg/dL 0 - 99 mg/dL    Comment:        Total Cholesterol/HDL:CHD Risk Coronary Heart  Disease Risk Table                     Men   Women  1/2 Average Risk   3.4   3.3  Average Risk       5.0   4.4  2 X Average Risk   9.6   7.1  3 X Average Risk  23.4   11.0        Use the calculated Patient Ratio above and the CHD Risk Table to determine the patient's CHD Risk.        ATP III CLASSIFICATION (LDL):  <100     mg/dL   Optimal  563-875  mg/dL   Near or Above                    Optimal  130-159  mg/dL   Borderline  643-329  mg/dL   High  >518     mg/dL   Very High Performed at [facility], [address], [place], [place] [address]   TSH     Status: None   Collection Time: 01/22/21  6:16 AM  Result Value Ref Range   TSH 1.378 0.350 - 4.500 uIU/mL    Comment: Performed by a 3rd Generation assay with a functional sensitivity of <=0.01 uIU/mL. Performed at [facility], [address] [place], [place] [address]   LDL cholesterol, direct     Status: None   Collection Time: 01/22/21  6:16 AM  Result Value Ref Range   Direct LDL 95.7 0 - 99 mg/dL    Comment: Performed at [facility], [address] [place], [place] [address]  Glucose, capillary     Status: None  Collection Time: 01/22/21 11:32 AM  Result Value Ref Range   Glucose-Capillary 93 70 - 99 mg/dL    Comment: Glucose reference range applies only to samples taken after fasting for at least 8 hours.  Glucose, capillary     Status: Abnormal   Collection Time: 01/23/21 10:01 AM  Result Value Ref Range   Glucose-Capillary 101 (H) 70 - 99 mg/dL    Comment: Glucose reference range applies only to samples taken after fasting for at least 8 hours.   Comment 1 Notify RN    Comment 2 Document in Chart    Blood Alcohol level:  Lab Results  Component Value Date   ETH <10 01/20/2021   Metabolic Disorder Labs: Lab Results  Component Value Date   HGBA1C 5.8 (H) 02/19/2020   No results found for: PROLACTIN Lab Results  Component Value Date   CHOL 238 (H)  01/22/2021   TRIG 556 (H) 01/22/2021   HDL 25 (L) 01/22/2021   CHOLHDL 9.5 01/22/2021   VLDL UNABLE TO CALCULATE IF TRIGLYCERIDE OVER 400 mg/dL 66/29/4765   LDLCALC UNABLE TO CALCULATE IF TRIGLYCERIDE OVER 400 mg/dL 46/50/3546   LDLCALC 57 02/19/2020   Physical Findings: AIMS:  , ,  ,  ,    CIWA:    COWS:     Musculoskeletal: Strength & Muscle Tone: within normal limits Gait & Station: normal Patient leans: N/A  Psychiatric Specialty Exam:  Presentation  General Appearance: Appropriate for Environment; Casual; Fairly Groomed  Eye Contact:Good  Speech:Clear and Coherent; Normal Rate  Speech Volume:Normal  Handedness:Right  Mood and Affect  Mood:Anxious (Currently denies any symptoms of depression, but anxious abot getting discharged.)  Affect: Appropriate; Congruent  Thought Process  Thought Processes:Coherent; Goal Directed; Linear  Descriptions of Associations:Intact  Orientation:Full (Time, Place and Person)  Thought Content:Logical  History of Schizophrenia/Schizoaffective disorder:No  Duration of Psychotic Symptoms:No data recorded Hallucinations:No data recorded  Ideas of Reference:None  Suicidal Thoughts:No data recorded  Homicidal Thoughts:No data recorded  Sensorium  Memory:Immediate Good; Recent Good; Remote Good  Judgment:Fair  Insight:Fair  Executive Functions  Concentration:Good  Attention Span:Good  Recall:Good  Fund of Knowledge:Fair  Language:Good  Psychomotor Activity  Psychomotor Activity:No data recorded  Assets  Assets:Communication Skills; Desire for Improvement; Housing; Physical Health; Resilience; Social Support; Talents/Skills  Sleep  Sleep:No data recorded  Physical Exam: Physical Exam Vitals and nursing note reviewed.  HENT:     Head: Normocephalic.     Nose: Nose normal.     Mouth/Throat:     Pharynx: Oropharynx is clear.  Eyes:     Pupils: Pupils are equal, round, and reactive to light.   Cardiovascular:     Rate and Rhythm: Normal rate.     Pulses: Normal pulses.  Pulmonary:     Effort: Pulmonary effort is normal.  Genitourinary:    Comments: Deferred Musculoskeletal:        General: Normal range of motion.     Cervical back: Normal range of motion.  Skin:    General: Skin is warm and dry.  Neurological:     General: No focal deficit present.     Mental Status: He is alert and oriented to person, place, and time.  Psychiatric:        Attention and Perception: Attention and perception normal.        Mood and Affect: Mood is anxious (about getting discharged). Mood is not depressed or elated. Affect is not labile, blunt, flat, angry, tearful or inappropriate.  Speech: Speech normal.        Behavior: Behavior normal. Behavior is not agitated, slowed, aggressive, withdrawn, hyperactive or combative. Behavior is cooperative.        Thought Content: Thought content normal. Thought content is not paranoid or delusional. Thought content does not include homicidal or suicidal ideation. Thought content does not include homicidal or suicidal plan.        Cognition and Memory: Cognition and memory normal. Cognition is not impaired. Memory is not impaired. He does not exhibit impaired recent memory or impaired remote memory.        Judgment: Judgment is impulsive (Hx of impulsive behavior.). Judgment is not inappropriate.   Review of Systems  Constitutional:  Negative for chills, diaphoresis and malaise/fatigue.  HENT:  Negative for congestion and sore throat.   Eyes:  Negative for blurred vision.  Respiratory:  Negative for cough, shortness of breath and wheezing.   Cardiovascular:  Negative for chest pain and palpitations.  Gastrointestinal:  Negative for abdominal pain, constipation, diarrhea, heartburn, nausea and vomiting.  Genitourinary:  Negative for dysuria.  Musculoskeletal:  Negative for joint pain and myalgias.  Skin: Negative.   Neurological:  Negative for  dizziness, tingling, tremors, sensory change, speech change, focal weakness, seizures, loss of consciousness, weakness and headaches.  Endo/Heme/Allergies:        Allergies: NKDA  Psychiatric/Behavioral:  Positive for depression ("Improving"). Negative for hallucinations, memory loss, substance abuse and suicidal ideas. The patient is not nervous/anxious and does not have insomnia.   Blood pressure 108/71, pulse 71, temperature 98 F (36.7 C), temperature source Oral, resp. rate 16, height 5\' 11"  (1.803 m), weight 96 kg, SpO2 100 %. Body mass index is 29.52 kg/m.  Treatment Plan Summary: Daily contact with patient to assess and evaluate symptoms and progress in treatment and Medication management.   Continue inpatient hospitalization. Will continue today 01/23/2021 plan as below except where it is noted.   Depression/mood stabilization. Continue Mirtazapine 15 mg po Q bedtime. Continue Lamictal 200 mg po daily.   Anxiety. Continue Vistaril 25 mg po Q 6 hours prn.   Insomnia/depression. Continue Mirtazapine 15 mg po Q bedtime.  Continue Melatonin 3 mg po Q hs.   Other medical issues. Continue Protonix 80 mg po Q daily for GERD. Continue niacin 2,000 mg po Q bedtime for hyperlipidemia. POCT monitoring routinely Q mornings for blood glucose monitoring. Labs to be obtained: Hgba1c, TSH & Lipid panel: results pending.  Encourage group participation.  Discharge disposition plan is ongoing.  Armandina StammerAgnes Pola Furno, NP, pmhnp, fnp-bc 01/23/2021, 12:41 PM

## 2021-01-23 NOTE — Progress Notes (Signed)
   01/23/21 2234  Psych Admission Type (Psych Patients Only)  Admission Status Voluntary  Psychosocial Assessment  Patient Complaints None  Eye Contact Fair  Facial Expression Animated  Affect Appropriate to circumstance  Speech Logical/coherent  Interaction Assertive  Motor Activity Other (Comment) (WDL)  Appearance/Hygiene Unremarkable  Behavior Characteristics Appropriate to situation  Mood Pleasant  Thought Process  Coherency WDL  Content WDL  Delusions None reported or observed  Perception WDL  Hallucination None reported or observed  Judgment Poor  Confusion None  Danger to Self  Current suicidal ideation? Denies  Danger to Others  Danger to Others None reported or observed

## 2021-01-23 NOTE — Progress Notes (Signed)
[facility] Group Notes:  (Nursing/MHT/Case Management/Adjunct)  Date:  01/23/2021  Time:  2000  Type of Therapy:   wrap up group  Participation Level:  Active  Participation Quality:  Appropriate, Attentive, Sharing, and Supportive  Affect:  Appropriate  Cognitive:  Appropriate  Insight:  Improving  Engagement in Group:  Engaged  Modes of Intervention:  Clarification, Education, and Support  Summary of Progress/Problems: Positive thinking and positive change were discussed.   Marcille Buffy 01/23/2021, 8:43 PM

## 2021-01-23 NOTE — Progress Notes (Addendum)
   01/23/21 1100  Psych Admission Type (Psych Patients Only)  Admission Status Voluntary  Psychosocial Assessment  Patient Complaints None  Eye Contact Fair  Facial Expression Animated  Affect Appropriate to circumstance  Speech Logical/coherent  Interaction Assertive  Motor Activity Other (Comment) (WDL)  Appearance/Hygiene Unremarkable  Behavior Characteristics Cooperative;Appropriate to situation  Mood Pleasant  Thought Process  Coherency WDL  Content WDL  Delusions None reported or observed  Perception WDL  Hallucination None reported or observed  Judgment Poor  Confusion None  Danger to Self  Current suicidal ideation? Denies  Danger to Others  Danger to Others None reported or observed  D. Pt continues to present as friendly- smiles during interactions- observed in the milieu interacting appropriately with peers and attending groups. Per pt's self inventory, pt rated his depression, hopelessness and anxiety a 0/0/1, respectively. Pt reported that he is ready to go home, and stated that he needed to mow his lawn. Pt currently denies SI/HI and AVH  A. Labs and vitals monitored. Pt given and educated on medications. Pt supported emotionally and encouraged to express concerns and ask questions.   R. Pt remains safe with 15 minute checks. Will continue POC.

## 2021-01-23 NOTE — [facility] Group Notes (Signed)
[facility] LCSW Group Therapy Note  01/23/2021    Type of Therapy and Topic:  Group Therapy:  A Hero Worthy of Support  Participation Level:  Did Not Attend   Description of Group:  Patients in this group were introduced to the concept that additional supports including self-support are an essential part of recovery.  Matching needs with supports to help fulfill those needs was explained.  Establishing boundaries that can gradually be increased or decreased was described, with patients giving their own examples of establishing appropriate boundaries in their lives.  A song entitled "My Own Hero" was played and a group discussion ensued in which patients stated it inspired them to help themselves in order to succeed, because other people cannot achieve their goals such as sobriety or stability for them.  A song was played called "I Am Enough" which led to a discussion about being willing to believe we are worth the effort of being a self-support.   Therapeutic Goals: 1)  demonstrate the importance of being a key part of one's own support system 2)  discuss various available supports 3)  encourage patient to use music as part of their self-support and focus on goals 4)  elicit ideas from patients about supports that need to be added   Summary of Patient Progress:  The patient was invited to group, did not attend.  Therapeutic Modalities:   Motivational Interviewing Activity  Chrissy Ealey J Grossman-Orr      

## 2021-01-24 LAB — HEMOGLOBIN A1C
Hgb A1c MFr Bld: 5.5 % (ref 4.8–5.6)
Mean Plasma Glucose: 111 mg/dL

## 2021-01-24 MED ORDER — LURASIDONE HCL 40 MG PO TABS
40.0000 mg | ORAL_TABLET | Freq: Every day | ORAL | Status: DC
  Administered 2021-01-24 – 2021-01-25 (×2): 40 mg via ORAL
  Filled 2021-01-24 (×4): qty 1

## 2021-01-24 MED ORDER — MIRTAZAPINE 30 MG PO TABS
30.0000 mg | ORAL_TABLET | Freq: Every day | ORAL | Status: DC
  Filled 2021-01-24: qty 1

## 2021-01-24 MED ORDER — MIRTAZAPINE 15 MG PO TABS
15.0000 mg | ORAL_TABLET | Freq: Every day | ORAL | Status: DC
  Administered 2021-01-24: 15 mg via ORAL
  Filled 2021-01-24 (×3): qty 1

## 2021-01-24 NOTE — [facility] Group Notes (Signed)
Adult Psychoeducational Group Note  Date:  01/24/2021 Time:  5:28 PM  Group Topic/Focus:  Goals Group:   The focus of this group is to help patients establish daily goals to achieve during treatment and discuss how the patient can incorporate goal setting into their daily lives to aide in recovery.  Participation Level:  Active  Participation Quality:  Appropriate  Affect:  Appropriate  Cognitive:  Appropriate  Insight: Appropriate  Engagement in Group:  Engaged  Modes of Intervention:  Discussion Patient attended morning orientation/goal setting group and participated.  Additional Comments:   Harold Collins [name] 01/24/2021, 5:28 PM

## 2021-01-24 NOTE — Progress Notes (Signed)
[facility] MD Progress Note  01/24/2021 4:44 PM Harold Collins  MRN:  [id number]  Reason for admission:  Harold Collins is a 40 year old male with reported history of recurrent major depressive disorder, PTSD and unspecified anxiety disorder admitted from [place] ED after patient checked himself in following an impulsive suicide attempt by taking an overdose of approximately 20 tablets total of combination of hydroxyzine ($RemoveBeforeD'25mg'wdjfHsfwBdgpaU$  or $R'50mg'DE$  tablets) and mirtazapine ($RemoveBeforeDE'15mg'cMeFJDxReMzdnAE$  tablets) on the evening of 01/20/2021 in the context of increased marital stressors.  He reported having an upsetting argument with his wife prior to the impulsive overdose during which patient felt wife was "gas lighting" him and not being truthful about events and things he had seen with his own eyes.  Per collateral information from wife, patient had become increasing the concerned over the last month PTA that his wife is having an affair despite repeated reassurances by wife that she is not.  Objective: Medical record reviewed.  Patient's case discussed in detail with members of the treatment team.  I met with and evaluated the patient on the unit today for follow-up.  Patient appears improved from the last time I saw him but I suspect he is minimizing symptoms at least to some degree in order to obtain discharge.  He presents with stable euthymic affect and organized thought processes.  He makes no spontaneous mention of his concerns about wife being unfaithful to him and indeed appears less preoccupied with this concern when I discuss it with him.  Patient reports experiencing infidelity/cheating in a past relationship and acknowledges this experience may be making him more worried about infidelity on the part of his wife.  Patient continues to experience some paranoid concerns that wife is cheating on him but there is no other evidence of psychotic symptoms noted on exam.  Patient describes his mood as "great" and denies anhedonia or anxiety.  He is  eager to return to work.  Patient denies passive wish for death, SI, AI, other PI, AH, VH.  He is eating and sleeping well.  Patient denies rash or other medication side effects.  We discussed initiation of an SSRI or an atypical antipsychotic to try to decrease the intensity and frequency of patient's preoccupation/thoughts that his wife is being unfaithful to him.  Patient declines trial of SSRIs stating that he has taken them in the past and they made him feel "numb."  We discussed the target symptoms, anticipated treatment outcomes from, risks, benefits, side effects, alternatives to, etc, treatment with Latuda.  Patient provided consent to initiate trial of Latuda.  We will start Latuda 40 mg daily today.  I also discussed with patient the importance of patient participating in outpatient CBT psychotherapy to help reduce his compulsive checking and reassurance seeking and diminish his anxiety.  Patient is receptive to participation in outpatient psychotherapy.  The patient slept 6 hours last night.  No new labs today.  Vital signs are stable and within normal limits.  I attempted to contact patient's outpatient psychiatrist, Dr. Adele Schilder but outpatient psychiatrist is currently out of the office and will not return until 01/31/2021.  I made direct phone contact today with patient's wife Harold Collins with patient's permission and signed consent to do so.  Ms. Noteboom provided collateral information.  Wife states patient has had intermittent preoccupation and paranoia that wife is cheating on him over the last 3 years which has significantly intensified over the last 1 to 2 months.  Wife states that patient has been looking  at her phone and comparing the numbers from calls and texts to those on the phone bill to see if wife has deleted any texts or calls.  She also reports that patient has been viewing videotapes of wife's work meetings which are posted online (wife works for [employer]) and drawing  conclusions from actions patient observes on the video tapes that wife is cheating on him with a Mudlogger.  Patient's wife denies observing any other paranoia or psychotic symptoms on the part of patient.  Education provided regarding symptoms, differential diagnosis, medications, anticipated treatment outcomes, treatment plan, aftercare plans, etc.  She was given opportunity to ask questions and verbalize concerns.  Ms. Haycraft questions were answered.  She stated understanding of information discussed and expressed appreciation for the phone call.  Principal Problem: Severe episode of recurrent major depressive disorder, without psychotic features ([place]) Diagnosis: Principal Problem:   Severe episode of recurrent major depressive disorder, without psychotic features ([place]) Active Problems:   PTSD (post-traumatic stress disorder)   MDD (major depressive disorder), recurrent severe, without psychosis ([place])   Anxiety disorder, unspecified  Total Time spent with patient:  35 minutes  Past Psychiatric History: See admission H&P  Past Medical History:  Past Medical History:  Diagnosis Date   Allergy    animal   Anxiety    Barrett's esophagus    Depression    GERD (gastroesophageal reflux disease)    Major depressive disorder    PTSD (post-traumatic stress disorder)     Past Surgical History:  Procedure Laterality Date   esophageal abrasions  2017   2016 4 times   MENISCUS REPAIR Left    SHOULDER SURGERY     UPPER GASTROINTESTINAL ENDOSCOPY  2017   2016   WRIST SURGERY     Family History:  Family History  Problem Relation Age of Onset   Depression Mother    Diabetes Mother    Anxiety disorder Mother    Physical abuse Mother    Colon polyps Mother    Cancer Father        gsophageal   Depression Father    Anxiety disorder Father    Physical abuse Father    Esophageal cancer Father    Depression Sister    Anxiety disorder Sister    Physical abuse Sister    Depression  Brother    ADD / ADHD Brother    Anxiety disorder Brother    Colon cancer Neg Hx    Rectal cancer Neg Hx    Stomach cancer Neg Hx    Family Psychiatric  History: See admission H&P Social History:  Social History   Substance and Sexual Activity  Alcohol Use No     Social History   Substance and Sexual Activity  Drug Use No    Social History   Socioeconomic History   Marital status: Married    Spouse name: Not on file   Number of children: 2   Years of education: Not on file   Highest education level: High school graduate  Occupational History   Not on file  Tobacco Use   Smoking status: Every Day    Packs/day: 1.00    Types: Cigarettes   Smokeless tobacco: Never  Vaping Use   Vaping Use: Former   Quit date: 04/26/2017  Substance and Sexual Activity   Alcohol use: No   Drug use: No   Sexual activity: Yes    Partners: Female    Birth control/protection: None  Other Topics  Concern   Not on file  Social History Narrative   Not on file   Social Determinants of Health   Financial Resource Strain: Not on file  Food Insecurity: Not on file  Transportation Needs: Not on file  Physical Activity: Not on file  Stress: Not on file  Social Connections: Not on file   Additional Social History:                         Sleep: Good  Appetite:  Good  Current Medications: Current Facility-Administered Medications  Medication Dose Route Frequency Provider Last Rate Last Admin   acetaminophen (TYLENOL) tablet 650 mg  650 mg Oral Q6H PRN Prescilla Sours, PA-C       alum & mag hydroxide-simeth (MAALOX/MYLANTA) 200-200-20 MG/5ML suspension 30 mL  30 mL Oral Q4H PRN Margorie John W, PA-C       hydrOXYzine (ATARAX/VISTARIL) tablet 25 mg  25 mg Oral Q6H PRN Arthor Captain, MD       lamoTRIgine (LAMICTAL) tablet 200 mg  200 mg Oral Daily Arthor Captain, MD   200 mg at 01/24/21 0810   lurasidone (LATUDA) tablet 40 mg  40 mg Oral Q breakfast Arthor Captain, MD   40 mg  at 01/24/21 1147   magnesium hydroxide (MILK OF MAGNESIA) suspension 30 mL  30 mL Oral Daily PRN Margorie John W, PA-C       melatonin tablet 3 mg  3 mg Oral QHS PRN Arthor Captain, MD       mirtazapine (REMERON) tablet 15 mg  15 mg Oral QHS Arthor Captain, MD       niacin (NIASPAN) CR tablet 1,000 mg  1,000 mg Oral BH-qamhs Sharma Covert, MD   1,000 mg at 01/24/21 0810   pantoprazole (PROTONIX) EC tablet 80 mg  80 mg Oral Daily Margorie John W, PA-C   80 mg at 01/24/21 6160    Lab Results:  Results for orders placed or performed during the hospital encounter of 01/21/21 (from the past 48 hour(s))  Glucose, capillary     Status: Abnormal   Collection Time: 01/23/21 10:01 AM  Result Value Ref Range   Glucose-Capillary 101 (H) 70 - 99 mg/dL    Comment: Glucose reference range applies only to samples taken after fasting for at least 8 hours.   Comment 1 Notify RN    Comment 2 Document in Chart     Blood Alcohol level:  Lab Results  Component Value Date   ETH <10 73/71/0626    Metabolic Disorder Labs: Lab Results  Component Value Date   HGBA1C 5.5 01/22/2021   MPG 111 01/22/2021   No results found for: PROLACTIN Lab Results  Component Value Date   CHOL 238 (H) 01/22/2021   TRIG 556 (H) 01/22/2021   HDL 25 (L) 01/22/2021   CHOLHDL 9.5 01/22/2021   VLDL UNABLE TO CALCULATE IF TRIGLYCERIDE OVER 400 mg/dL 01/22/2021   LDLCALC UNABLE TO CALCULATE IF TRIGLYCERIDE OVER 400 mg/dL 01/22/2021   LDLCALC 57 02/19/2020    Physical Findings: AIMS: Facial and Oral Movements Muscles of Facial Expression: None, normal Lips and Perioral Area: None, normal Jaw: None, normal Tongue: None, normal,Extremity Movements Upper (arms, wrists, hands, fingers): None, normal Lower (legs, knees, ankles, toes): None, normal, Trunk Movements Neck, shoulders, hips: None, normal, Overall Severity Severity of abnormal movements (highest score from questions above): None, normal Incapacitation due  to abnormal movements: None, normal  Patient's awareness of abnormal movements (rate only patient's report): No Awareness, Dental Status Current problems with teeth and/or dentures?: No Does patient usually wear dentures?: No  CIWA:    COWS:     Musculoskeletal: Strength & Muscle Tone: within normal limits Gait & Station: normal Patient leans: N/A  Psychiatric Specialty Exam:  Presentation  General Appearance: Appropriate for Environment  Eye Contact:Good  Speech:Clear and Coherent; Normal Rate  Speech Volume:Normal  Handedness:Right   Mood and Affect  Mood:Euthymic  Affect:Full Range   Thought Process  Thought Processes:Coherent; Goal Directed  Descriptions of Associations:Intact  Orientation:Full (Time, Place and Person)  Thought Content:Logical (Less focused on concerns about marital issues)  History of Schizophrenia/Schizoaffective disorder:No  Duration of Psychotic Symptoms:No data recorded Hallucinations:Hallucinations: None  Ideas of Reference:None  Suicidal Thoughts:Suicidal Thoughts: No  Homicidal Thoughts:Homicidal Thoughts: No   Sensorium  Memory:Immediate Good; Recent Good  Judgment:Fair  Insight:Fair   Executive Functions  Concentration:Good  Attention Span:Good  [place] of Knowledge:Good  Language:Good   Psychomotor Activity  Psychomotor Activity:Psychomotor Activity: Normal   Assets  Assets:Communication Skills; Desire for Improvement; Housing; Physical Health; Social Support; Talents/Skills; Vocational/Educational   Sleep  Sleep:Sleep: Good Number of Hours of Sleep: 6    Physical Exam: Physical Exam Vitals and nursing note reviewed.  Constitutional:      General: He is not in acute distress.    Appearance: Normal appearance. He is not diaphoretic.  HENT:     Head: Normocephalic and atraumatic.  Cardiovascular:     Rate and Rhythm: Normal rate.  Pulmonary:     Effort: Pulmonary effort is normal.   Neurological:     General: No focal deficit present.     Mental Status: He is alert and oriented to person, place, and time.   Review of Systems  Constitutional: Negative.   HENT: Negative.    Respiratory: Negative.    Cardiovascular: Negative.   Gastrointestinal: Negative.   Genitourinary: Negative.   Musculoskeletal: Negative.   Skin:  Negative for rash.  Neurological: Negative.   Psychiatric/Behavioral:  Negative for depression, hallucinations and suicidal ideas. The patient is not nervous/anxious and does not have insomnia.   Blood pressure 122/83, pulse 73, temperature 98.3 F (36.8 C), temperature source Oral, resp. rate 18, height $RemoveBe'5\' 11"'aeSnxZYNm$  (1.803 m), weight 96 kg, SpO2 100 %. Body mass index is 29.52 kg/m.   Treatment Plan Summary: Patient is a 41 year old male with a history of depression, PTSD and anxiety admitted following an impulsive suicide attempt.  During this hospitalization patient appears to either have symptoms of delusional versus obsessive jealousy with preoccupation that his wife is being unfaithful.  Patient has improved from the events precipitating admission.  He is reporting improved mood, decreased anxiety and resolution of suicidal ideation.  Patient is receptive to undergoing trial of Latuda in addition to continuing current meds.  Daily contact with patient to assess and evaluate symptoms and progress in treatment and Medication management  Mood disorder -Continue Lamictal 200 mg daily for mood stabilization -Continue mirtazapine 15 mg at bedtime for depression, anxiety and sleep -Start Latuda 40 mg daily for adjunct of treatment of mood symptoms and to target paranoid concerns about wife's infidelity  Anxiety  -Patient has declined trial of SSRI to target OCD-like symptoms -Continue hydroxyzine 25 mg Q6H PRN -I have advised patient he would benefit from participation in CBT therapy after discharge  Insomnia -Continue melatonin 3 mg at  bedtime  GERD -Continue Protonix 80 mg daily  Hyperlipidemia -  Continue niacin CR 1000 mg every morning and nightly  Discharge planning in progress   Arthor Captain, MD 01/24/2021, 4:44 PM

## 2021-01-24 NOTE — [facility] Group Notes (Signed)
Spiritual care group on grief and loss facilitated by chaplain Dyanne Carrel, [facility]   Group Goal:   Support / Education around grief and loss   Members engage in facilitated group support and psycho-social education.   Group Description:   Following introductions and group rules, group members engaged in facilitated group dialog and support around topic of loss, with particular support around experiences of loss in their lives. Group Identified types of loss (relationships / self / things) and identified patterns, circumstances, and changes that precipitate losses. Reflected on thoughts / feelings around loss, normalized grief responses, and recognized variety in grief experience. Group noted Worden's four tasks of grief in discussion.   Group drew on Adlerian / Rogerian, narrative, MI,   Patient Progress: Patient was in the group initially, but was called out to speak to a team member and did not return. He engaged in the group conversation and shared openly about his thoughts on grief.  Chaplain Dyanne Carrel, [facility] Pager, [phone] 3:13 PM

## 2021-01-24 NOTE — Tx Team (Signed)
Interdisciplinary Treatment and Diagnostic Plan Update  01/24/2021 Time of Session: 9:15am Harold Collins MRN: [id number]  Principal Diagnosis: Severe episode of recurrent major depressive disorder, without psychotic features (HCC)  Secondary Diagnoses: Principal Problem:   Severe episode of recurrent major depressive disorder, without psychotic features (HCC) Active Problems:   PTSD (post-traumatic stress disorder)   MDD (major depressive disorder), recurrent severe, without psychosis (HCC)   Anxiety disorder, unspecified   Current Medications:  Current Facility-Administered Medications  Medication Dose Route Frequency Provider Last Rate Last Admin   acetaminophen (TYLENOL) tablet 650 mg  650 mg Oral Q6H PRN Jaclyn Shaggy, PA-C       alum & mag hydroxide-simeth (MAALOX/MYLANTA) 200-200-20 MG/5ML suspension 30 mL  30 mL Oral Q4H PRN Melbourne Abts W, PA-C       hydrOXYzine (ATARAX/VISTARIL) tablet 25 mg  25 mg Oral Q6H PRN Claudie Revering, MD       lamoTRIgine (LAMICTAL) tablet 200 mg  200 mg Oral Daily Claudie Revering, MD   200 mg at 01/24/21 0810   lurasidone (LATUDA) tablet 40 mg  40 mg Oral Q breakfast Claudie Revering, MD       magnesium hydroxide (MILK OF MAGNESIA) suspension 30 mL  30 mL Oral Daily PRN Ladona Ridgel, [place] W, PA-C       melatonin tablet 3 mg  3 mg Oral QHS PRN Claudie Revering, MD       mirtazapine (REMERON) tablet 15 mg  15 mg Oral QHS Claudie Revering, MD       niacin (NIASPAN) CR tablet 1,000 mg  1,000 mg Oral BH-qamhs Antonieta Pert, MD   1,000 mg at 01/24/21 0810   pantoprazole (PROTONIX) EC tablet 80 mg  80 mg Oral Daily Melbourne Abts W, PA-C   80 mg at 01/24/21 2841   PTA Medications: Medications Prior to Admission  Medication Sig Dispense Refill Last Dose   Cholecalciferol 25 MCG (1000 UT) capsule Take 1,000 Units by mouth 2 (two) times daily.      hydrOXYzine (VISTARIL) 50 MG capsule Take 1 capsule (50 mg total) by mouth 3 (three) times daily as needed for  anxiety. 90 capsule 2    lamoTRIgine (LAMICTAL) 200 MG tablet Take 1 tablet (200 mg total) by mouth daily. 30 tablet 2    loratadine-pseudoephedrine (CLARITIN-D 24-HOUR) 10-240 MG 24 hr tablet Take 1 tablet by mouth daily.      mirtazapine (REMERON) 15 MG tablet Take 1 tablet (15 mg total) by mouth at bedtime. 30 tablet 2    niacin (NIASPAN) 1000 MG CR tablet Take 1,000 mg by mouth in the morning and at bedtime.      omeprazole (PRILOSEC) 40 MG capsule Take 1 capsule (40 mg total) by mouth daily. You are due for a follow up appointment and an EGD. Please call to schedule. Thanks 90 capsule 0     Patient Stressors: Marital or family conflict Traumatic event  Patient Strengths: Ability for insight Average or above average intelligence General fund of knowledge Physical Health Special hobby/interest Supportive family/friends Work skills  Treatment Modalities: Medication Management, Group therapy, Case management,  1 to 1 session with clinician, Psychoeducation, Recreational therapy.   Physician Treatment Plan for Primary Diagnosis: Severe episode of recurrent major depressive disorder, without psychotic features (HCC) Long Term Goal(s): Improvement in symptoms so as ready for discharge   Short Term Goals: Ability to identify and develop effective coping behaviors will improve Ability to maintain clinical measurements within normal  limits will improve Compliance with prescribed medications will improve Ability to identify changes in lifestyle to reduce recurrence of condition will improve Ability to verbalize feelings will improve Ability to disclose and discuss suicidal ideas Ability to demonstrate self-control will improve  Medication Management: Evaluate patient's response, side effects, and tolerance of medication regimen.  Therapeutic Interventions: 1 to 1 sessions, Unit Group sessions and Medication administration.  Evaluation of Outcomes: Progressing  Physician Treatment  Plan for Secondary Diagnosis: Principal Problem:   Severe episode of recurrent major depressive disorder, without psychotic features (HCC) Active Problems:   PTSD (post-traumatic stress disorder)   MDD (major depressive disorder), recurrent severe, without psychosis (HCC)   Anxiety disorder, unspecified  Long Term Goal(s): Improvement in symptoms so as ready for discharge   Short Term Goals: Ability to identify and develop effective coping behaviors will improve Ability to maintain clinical measurements within normal limits will improve Compliance with prescribed medications will improve Ability to identify changes in lifestyle to reduce recurrence of condition will improve Ability to verbalize feelings will improve Ability to disclose and discuss suicidal ideas Ability to demonstrate self-control will improve     Medication Management: Evaluate patient's response, side effects, and tolerance of medication regimen.  Therapeutic Interventions: 1 to 1 sessions, Unit Group sessions and Medication administration.  Evaluation of Outcomes: Progressing   RN Treatment Plan for Primary Diagnosis: Severe episode of recurrent major depressive disorder, without psychotic features (HCC) Long Term Goal(s): Knowledge of disease and therapeutic regimen to maintain health will improve  Short Term Goals: Ability to remain free from injury will improve, Ability to verbalize frustration and anger appropriately will improve, Ability to demonstrate self-control, Ability to participate in decision making will improve, Ability to identify and develop effective coping behaviors will improve, and Compliance with prescribed medications will improve  Medication Management: RN will administer medications as ordered by provider, will assess and evaluate patient's response and provide education to patient for prescribed medication. RN will report any adverse and/or side effects to prescribing provider.  Therapeutic  Interventions: 1 on 1 counseling sessions, Psychoeducation, Medication administration, Evaluate responses to treatment, Monitor vital signs and CBGs as ordered, Perform/monitor CIWA, COWS, AIMS and Fall Risk screenings as ordered, Perform wound care treatments as ordered.  Evaluation of Outcomes: Progressing   LCSW Treatment Plan for Primary Diagnosis: Severe episode of recurrent major depressive disorder, without psychotic features (HCC) Long Term Goal(s): Safe transition to appropriate next level of care at discharge, Engage patient in therapeutic group addressing interpersonal concerns.  Short Term Goals: Engage patient in aftercare planning with referrals and resources, Increase social support, Increase ability to appropriately verbalize feelings, Increase emotional regulation, Identify triggers associated with mental health/substance abuse issues, and Increase skills for wellness and recovery  Therapeutic Interventions: Assess for all discharge needs, 1 to 1 time with Social worker, Explore available resources and support systems, Assess for adequacy in community support network, Educate family and significant other(s) on suicide prevention, Complete Psychosocial Assessment, Interpersonal group therapy.  Evaluation of Outcomes: Progressing   Progress in Treatment: Attending groups: Yes. Participating in groups: Yes. Taking medication as prescribed: Yes. Toleration medication: Yes. Family/Significant other contact made: Yes, individual(s) contacted:  wife Patient understands diagnosis: Yes. Discussing patient identified problems/goals with staff: Yes. Medical problems stabilized or resolved: Yes. Denies suicidal/homicidal ideation: Yes. Issues/concerns per patient self-inventory: No.   New problem(s) identified: No, Describe:  none  New Short Term/Long Term Goal(s): medication stabilization, elimination of SI thoughts, development of comprehensive mental wellness plan.  Patient  Goals:  "To leave"  Discharge Plan or Barriers: Pt is to return home and continue services with Dr. Lolly Mustache  Reason for Continuation of Hospitalization: Anxiety Depression Medication stabilization Suicidal ideation  Estimated Length of Stay: 3-5 days  Attendees: Patient: Harold Collins 01/24/2021   Physician: Arna Snipe, DO 01/24/2021   Nursing:  01/24/2021  RN Care Manager: 01/24/2021   Social Worker: Ruthann Cancer, LCSW 01/24/2021   Recreational Therapist:  01/24/2021   Other:  01/24/2021   Other:  01/24/2021   Other: 01/24/2021    Scribe for Treatment Team: Otelia Santee, LCSW 01/24/2021 11:20 AM

## 2021-01-24 NOTE — Plan of Care (Signed)
  Problem: Education: Goal: Emotional status will improve Outcome: Progressing Goal: Mental status will improve Outcome: Progressing   Problem: Health Behavior/Discharge Planning: Goal: Compliance with treatment plan for underlying cause of condition will improve Outcome: Progressing   

## 2021-01-24 NOTE — Progress Notes (Signed)
Recreation Therapy Notes  Date:  8.1.22 Time: 0930 Location: [address] Dayroom  Group Topic: Stress Management  Goal Area(s) Addresses:  Patient will identify positive stress management techniques. Patient will identify benefits of using stress management post d/c.  Behavioral Response: Appropriate  Intervention: Stress Management  Activity :  Meditation.  LRT played a meditation that focused on challenging negative thoughts.  Patients were to listen and follow along as meditation was played to fully engaged in activity.  Education:  Stress Management, Discharge Planning.   Education Outcome: Acknowledges Education  Clinical Observations/Feedback: Pt attended and participated in group.  Pt had no concerns.    Laurynn Mccorvey, LRT/CTRS         Kasen Sako A 01/24/2021 11:19 AM 

## 2021-01-24 NOTE — Progress Notes (Signed)
   01/24/21 0800  Psych Admission Type (Psych Patients Only)  Admission Status Voluntary  Psychosocial Assessment  Patient Complaints None  Eye Contact Fair  Facial Expression Animated  Affect Appropriate to circumstance  Speech Logical/coherent  Interaction Assertive  Motor Activity Other (Comment) (WDL)  Appearance/Hygiene Unremarkable  Behavior Characteristics Appropriate to situation  Mood Pleasant;Anxious  Thought Process  Coherency WDL  Content WDL  Delusions None reported or observed  Perception WDL  Hallucination None reported or observed  Judgment Poor  Confusion None  Danger to Self  Current suicidal ideation? Denies  Danger to Others  Danger to Others None reported or observed  [place] NOVEL CORONAVIRUS (COVID-19) DAILY CHECK-OFF SYMPTOMS - answer yes or no to each - every day NO YES  Have you had a fever in the past 24 hours?  Fever (Temp > 37.80C / 100F) X   Have you had any of these symptoms in the past 24 hours? New Cough  Sore Throat   Shortness of Breath  Difficulty Breathing  Unexplained Body Aches   X   Have you had any one of these symptoms in the past 24 hours not related to allergies?   Runny Nose  Nasal Congestion  Sneezing   X   If you have had runny nose, nasal congestion, sneezing in the past 24 hours, has it worsened?  X   EXPOSURES - check yes or no X   Have you traveled outside the state in the past 14 days?  X   Have you been in contact with someone with a confirmed diagnosis of COVID-19 or PUI in the past 14 days without wearing appropriate PPE?  X   Have you been living in the same home as a person with confirmed diagnosis of COVID-19 or a PUI (household contact)?    X   Have you been diagnosed with COVID-19?    X              What to do next: Answered NO to all: Answered YES to anything:   Proceed with unit schedule Follow the [facility] Inpatient Flowsheet.

## 2021-01-24 NOTE — [facility] Suicide Risk Assessment (Addendum)
[facility] INPATIENT:  Family/Significant Other Suicide Prevention Education  Suicide Prevention Education:  Education Completed; Arsalan Brisbin [phone] (Wife),  (name of family member/significant other) has been identified by the patient as the family member/significant other with whom the patient will be residing, and identified as the person(s) who will aid the patient in the event of a mental health crisis (suicidal ideations/suicide attempt).  With written consent from the patient, the family member/significant other has been provided the following suicide prevention education, prior to the and/or following the discharge of the patient.  The suicide prevention education provided includes the following: Suicide risk factors Suicide prevention and interventions National Suicide Hotline telephone number [employer] assessment telephone number [facility] Emergency Assistance 911 [facility] and/or Residential Mobile Crisis Unit telephone number  Request made of family/significant other to: Remove weapons (e.g., guns, rifles, knives), all items previously/currently identified as safety concern.   Remove drugs/medications (over-the-counter, prescriptions, illicit drugs), all items previously/currently identified as a safety concern.  The family member/significant other verbalizes understanding of the suicide prevention education information provided.  The family member/significant other agrees to remove the items of safety concern listed above.  "He has been very paranoid/jealous for several years. This summer is the worst it has been. He thinks I am cheating on him. He obsessively checks my phone and the phone bill trying to find "evidence". He goes back and watches old recordings of my work meetings and swears he sees me doing things with my boss. Thursday night he was upset about all of this and grabbed a handful of his pills and took them in front of me. He told me to tell  our son he died of a heart attack. He has never attempted suicide before. I am worried his current medications are not working because of his increase in paranoia. He has not discussed these ideas with me since being in the hospital.". -No safety concerns -Weapons secured in safe w/ combination pt nor pt's wife knows; CSW advised that they have a locksmith come out and open the safe and the guns be removed. -Medications secured.   Harold Collins 01/24/2021, 10:32 AM

## 2021-01-24 NOTE — Progress Notes (Signed)
     01/24/21 2141  Psych Admission Type (Psych Patients Only)  Admission Status Voluntary  Psychosocial Assessment  Patient Complaints None  Eye Contact Fair  Facial Expression Animated  Affect Appropriate to circumstance  Speech Logical/coherent  Interaction Assertive  Motor Activity Other (Comment) (wnl)  Appearance/Hygiene Unremarkable  Behavior Characteristics Cooperative  Mood Pleasant;Anxious  Thought Process  Coherency WDL  Content WDL  Delusions None reported or observed  Perception WDL  Hallucination None reported or observed  Judgment Poor  Confusion None  Danger to Self  Current suicidal ideation? Denies  Danger to Others  Danger to Others None reported or observed

## 2021-01-24 NOTE — [facility] Group Notes (Signed)
Therapy Type: Group Therapy  Participation Level:  Active   Patients received a worksheet with an outline of 2 gingerbread men with a separation in the middle of the page. One sign designated what the pt sees about themselves and the other is what others see. Pts were asked to introduce themselves and share something they like about themself. Pts were then asked to draw, write or color how they view themselves as well as how they are viewed by others. CSW led discussion about the feelings and words associated with each side.   Patient Summary:   During introductions pt shared their name and stated they liked that they are sarcasstic. Pt was appropriate and participated in group discussion.   Harold Collins, LCSWA Clinicial Social Worker [employer]

## 2021-01-25 DIAGNOSIS — F332 Major depressive disorder, recurrent severe without psychotic features: Principal | ICD-10-CM

## 2021-01-25 DIAGNOSIS — F22 Delusional disorders: Secondary | ICD-10-CM | POA: Diagnosis present

## 2021-01-25 LAB — GLUCOSE, CAPILLARY: Glucose-Capillary: 121 mg/dL — ABNORMAL HIGH (ref 70–99)

## 2021-01-25 MED ORDER — LURASIDONE HCL 40 MG PO TABS: 40.0000 mg | ORAL_TABLET | Freq: Every day | ORAL | 0 refills | Status: DC

## 2021-01-25 MED ORDER — LAMOTRIGINE 200 MG PO TABS: 200.0000 mg | ORAL_TABLET | Freq: Every day | ORAL | Status: DC

## 2021-01-25 MED ORDER — LAMOTRIGINE 200 MG PO TABS
200.0000 mg | ORAL_TABLET | Freq: Every day | ORAL | Status: DC
Start: 2021-01-26 — End: 2021-01-25
  Filled 2021-01-25 (×2): qty 1

## 2021-01-25 MED ORDER — LAMOTRIGINE 200 MG PO TABS: 200.0000 mg | ORAL_TABLET | Freq: Every day | ORAL | 0 refills | Status: DC

## 2021-01-25 MED ORDER — MIRTAZAPINE 15 MG PO TABS: 15.0000 mg | ORAL_TABLET | Freq: Every day | ORAL | 0 refills | Status: DC

## 2021-01-25 MED ORDER — PANTOPRAZOLE SODIUM 40 MG PO TBEC: 80.0000 mg | DELAYED_RELEASE_TABLET | Freq: Every day | ORAL | 0 refills | Status: DC

## 2021-01-25 NOTE — Progress Notes (Signed)
  [facility] Adult Case Management Discharge Plan :  Will you be returning to the same living situation after discharge:  Yes,  personal home At discharge, do you have transportation home?: Yes,  wife Do you have the ability to pay for your medications: Yes,  private insurance  Release of information consent forms completed and in the chart;  Patient's signature needed at discharge.  Patient to Follow up at:  Follow-up Information     [facility]. Go on 01/27/2021.   Specialty: Behavioral Health Why: You have an appointment for therapy services on 01/27/21 at 11:00 am. This appointment will be held in person.  You also have a Virtual appointment for medication management on 03/01/21 at 4:00 pm. Contact information: [address] [place] [address] [phone]                Next level of care provider has access to [facility] Link:yes  Safety Planning and Suicide Prevention discussed: Yes,  wife  Have you used any form of tobacco in the last 30 days? (Cigarettes, Smokeless Tobacco, Cigars, and/or Pipes): Patient Refused Screening  Has patient been referred to the Quitline?: Patient refused referral  Patient has been referred for addiction treatment: N/A  Felizardo Hoffmann, LCSWA 01/25/2021, 10:09 AM

## 2021-01-25 NOTE — Plan of Care (Signed)
Discharge note  Patient verbalizes readiness for discharge. Follow up plan explained, AVS, Transition record and SRA given. Prescriptions and teaching provided. Belongings returned and signed for. Suicide safety plan completed and signed. Patient verbalizes understanding. Patient denies SI/HI and assures this Clinical research associate they will seek assistance should that change. Patient discharged to lobby where family was waiting.  Problem: Education: Goal: Knowledge of [place] [facility] Education information/materials will improve Outcome: Adequate for Discharge Goal: Emotional status will improve Outcome: Adequate for Discharge Goal: Mental status will improve Outcome: Adequate for Discharge Goal: Verbalization of understanding the information provided will improve Outcome: Adequate for Discharge   Problem: Activity: Goal: Interest or engagement in activities will improve Outcome: Adequate for Discharge Goal: Sleeping patterns will improve Outcome: Adequate for Discharge   Problem: Coping: Goal: Ability to verbalize frustrations and anger appropriately will improve Outcome: Adequate for Discharge Goal: Ability to demonstrate self-control will improve Outcome: Adequate for Discharge   Problem: Health Behavior/Discharge Planning: Goal: Identification of resources available to assist in meeting health care needs will improve Outcome: Adequate for Discharge Goal: Compliance with treatment plan for underlying cause of condition will improve Outcome: Adequate for Discharge   Problem: Physical Regulation: Goal: Ability to maintain clinical measurements within normal limits will improve Outcome: Adequate for Discharge   Problem: Safety: Goal: Periods of time without injury will increase Outcome: Adequate for Discharge   Problem: Education: Goal: Utilization of techniques to improve thought processes will improve Outcome: Adequate for Discharge Goal: Knowledge of the prescribed therapeutic  regimen will improve Outcome: Adequate for Discharge   Problem: Activity: Goal: Interest or engagement in leisure activities will improve Outcome: Adequate for Discharge Goal: Imbalance in normal sleep/wake cycle will improve Outcome: Adequate for Discharge   Problem: Coping: Goal: Coping ability will improve Outcome: Adequate for Discharge Goal: Will verbalize feelings Outcome: Adequate for Discharge   Problem: Health Behavior/Discharge Planning: Goal: Ability to make decisions will improve Outcome: Adequate for Discharge Goal: Compliance with therapeutic regimen will improve Outcome: Adequate for Discharge   Problem: Role Relationship: Goal: Will demonstrate positive changes in social behaviors and relationships Outcome: Adequate for Discharge   Problem: Safety: Goal: Ability to disclose and discuss suicidal ideas will improve Outcome: Adequate for Discharge Goal: Ability to identify and utilize support systems that promote safety will improve Outcome: Adequate for Discharge   Problem: Self-Concept: Goal: Will verbalize positive feelings about self Outcome: Adequate for Discharge Goal: Level of anxiety will decrease Outcome: Adequate for Discharge   Problem: Education: Goal: Ability to make informed decisions regarding treatment will improve Outcome: Adequate for Discharge   Problem: Coping: Goal: Coping ability will improve Outcome: Adequate for Discharge   Problem: Health Behavior/Discharge Planning: Goal: Identification of resources available to assist in meeting health care needs will improve Outcome: Adequate for Discharge   Problem: Medication: Goal: Compliance with prescribed medication regimen will improve Outcome: Adequate for Discharge   Problem: Self-Concept: Goal: Ability to disclose and discuss suicidal ideas will improve Outcome: Adequate for Discharge Goal: Will verbalize positive feelings about self Outcome: Adequate for Discharge

## 2021-01-25 NOTE — Progress Notes (Signed)
The patient attended last evening's group and was appropriate.  

## 2021-01-25 NOTE — Discharge Summary (Signed)
Physician Discharge Summary Note  Patient:  Harold Collins is an 40 y.o., male MRN:  [id number] DOB:  1980-12-25 Patient phone:  [phone] (home)  Patient address:   [address] [place] [address],  Total Time spent with patient: 30 minutes  Date of Admission:  01/21/2021 Date of Discharge: 01/25/2021  Reason for Admission:  (From MD's admission note):  Harold Collins is a 40 year old male with reported history of recurrent major depressive disorder, PTSD and unspecified anxiety disorder admitted from [facility] ED after patient checked himself in following an impulsive suicide attempt by taking an overdose of approximately 20 tablets total of combination of hydroxyzine (25mg  or 50mg  tablets) and mirtazapine (15mg  tablets) on the evening of 01/20/2021 in the context of increased marital stressors.  Patient vomited upon arrival to the ED and ED notes document the patient continued to report feeling suicidal after his arrival there.  He reported having an upsetting argument with his wife prior to the impulsive overdose during which patient felt wife was "gas lighting" him and not being truthful about events and things he had seen with his own eyes.  Patient sees Dr. as an outpatient for medication management and is prescribed Lamictal 200 mg daily, mirtazapine 15 mg at bedtime and hydroxyzine 50 mg 3 times daily PRN anxiety.  He does not have a current therapist.  In the ED UDS and BAL were negative.  EKG showed normal sinus rhythm, ventricular rate of 62 and QT/QTc of 404/410.  Patient was deemed stable for inpatient psychiatric admission and was transferred to [facility] this morning for further evaluation and treatment.  During interview with me this morning, patient presents as cooperative and polite with anxious mood, stable appropriate affect and organized thought processes.  He states that he took approximately 10 tablets of Vistaril (25mg  tabs) and approximately 5 tablets of Remeron  (15mg  tabs) around 9:15 PM last night in "a moment of impulse" which he immediately regretted.  Patient states that he has been arguing more with his wife over the last month and felt she was "gas lighting" him last night.  Patient reports that he has had worry on and off that his wife may be cheating on him for the past 14 years.  He states that over the past month he has become increasingly worried that she may be having an affair and he has been questioning her about her behavior and checking her texts more frequently in recent weeks to reassure himself about the status of their relationship.  He denies recent depressed mood, anhedonia, change in sleep, change in energy or change in appetite.  He denies suicidal ideation except for just prior to the impulsive overdose.  Patient does report additional stressor of having eulogized a friend a few weeks ago who died from an unintentional heroin/fentanyl overdose.  Patient is eager for discharge and wants to get back to work and prefers to address his symptoms in the outpatient setting.  He also states he is looking forward to spending time with his children after discharge.  I discussed with patient the possible addition of an SSRI to target obsessive-compulsive like symptoms of increased obsessive thoughts about wife's possible infidelity and compulsive reassurance seeking by questioning wife and checking her text messages, etc.  Patient declines med changes and would like to defer initiation of SSRI for now and discuss with his outpatient doctor.  We also discussed the importance of therapy/CBT for OCD-like symptoms.  Patient is receptive to referral to  therapist and has not been in therapy for approximately 2 years.  Patient denies current depressed mood, anhedonia, passive wish for death, SI, AI, HI, AH, VH or PI.  He denies any history of psychotic symptoms.   Patient reports a remote history of marijuana use starting in high school until his early 65s.  He  has not smoked marijuana in over 10 years.  Patient reports very infrequent alcohol use and last had a drink on January 07, 2021.  He denies any other drug use.  He smokes 1 pack of cigarettes per day.  He does not vape.    Principal Problem: Severe episode of recurrent major depressive disorder, without psychotic features (HCC) Discharge Diagnoses: Principal Problem:   Severe episode of recurrent major depressive disorder, without psychotic features (HCC) Active Problems:   PTSD (post-traumatic stress disorder)   Anxiety disorder, unspecified   Delusional disorder, jealous type (HCC)   Past Psychiatric History: See H&P  Past Medical History:  Past Medical History:  Diagnosis Date   Allergy    animal   Anxiety    Barrett's esophagus    Depression    GERD (gastroesophageal reflux disease)    Major depressive disorder    PTSD (post-traumatic stress disorder)     Past Surgical History:  Procedure Laterality Date   esophageal abrasions  2017   2016 4 times   MENISCUS REPAIR Left    SHOULDER SURGERY     UPPER GASTROINTESTINAL ENDOSCOPY  2017   2016   WRIST SURGERY     Family History:  Family History  Problem Relation Age of Onset   Depression Mother    Diabetes Mother    Anxiety disorder Mother    Physical abuse Mother    Colon polyps Mother    Cancer Father        gsophageal   Depression Father    Anxiety disorder Father    Physical abuse Father    Esophageal cancer Father    Depression Sister    Anxiety disorder Sister    Physical abuse Sister    Depression Brother    ADD / ADHD Brother    Anxiety disorder Brother    Colon cancer Neg Hx    Rectal cancer Neg Hx    Stomach cancer Neg Hx    Family Psychiatric  History: See H&P Social History:  Social History   Substance and Sexual Activity  Alcohol Use No     Social History   Substance and Sexual Activity  Drug Use No    Social History   Socioeconomic History   Marital status: Married    Spouse name:  Not on file   Number of children: 2   Years of education: Not on file   Highest education level: High school graduate  Occupational History   Not on file  Tobacco Use   Smoking status: Every Day    Packs/day: 1.00    Types: Cigarettes   Smokeless tobacco: Never  Vaping Use   Vaping Use: Former   Quit date: 04/26/2017  Substance and Sexual Activity   Alcohol use: No   Drug use: No   Sexual activity: Yes    Partners: Female    Birth control/protection: None  Other Topics Concern   Not on file  Social History Narrative   Not on file   Social Determinants of Health   Financial Resource Strain: Not on file  Food Insecurity: Not on file  Transportation Needs: Not on file  Physical Activity: Not on file  Stress: Not on file  Social Connections: Not on file    Hospital Course:  After the above admission evaluation, Naser's presenting symptoms were noted. He was recommended for mood stabilization treatments. The medication regimen targeting those presenting symptoms were discussed with him & initiated with his consent. His home medications, Lamictal, Remeron, Niacin and Protonix were resumed during his hospitalization. He agreed to start Latuda to further help stabilize his mood. His UDS and BAL on arrival to the ED were both negative. He was medicated, stabilized & discharged on the medications as listed on his discharge medication list below. Besides the mood stabilization treatments, Minerva Areolaric was also enrolled & participated in the group counseling sessions being offered & held on this unit. He learned coping skills. He presented no other significant pre-existing medical issues that required treatment. He tolerated his treatment regimen without any adverse effects or reactions reported.   During the course of his hospitalization, the 15-minute checks were adequate to ensure patient's safety. Minerva Areolaric did not display any dangerous, violent or suicidal behavior on the unit.  He interacted with  patients & staff appropriately, participated appropriately in the group sessions/therapies. His medications were addressed & adjusted to meet his needs. He was recommended for outpatient follow-up care & medication management upon discharge to assure continuity of care & mood stability.  At the time of discharge patient is not reporting any acute suicidal/homicidal ideations. He feels more confident about his self-care & in managing his mental health. He currently denies any new issues or concerns. Education and supportive counseling provided throughout his hospital stay & upon discharge.   Today upon his discharge evaluation with the attending psychiatrist, Minerva Areolaric shares he is doing well and feels ready to be discharged home. He denies any other specific concerns. He is sleeping well. His appetite is good. He denies other physical complaints. He denies AH/VH, delusional thoughts or paranoia. He does not appear to be responding to any internal stimuli. He feels that his/her medications have been helpful & is in agreement to continue his/her current treatment regimen as recommended. He was able to engage in safety planning including [facility] or contact emergency services if he feels unable to maintain his/her own safety or the safety of others. Pt had no further questions, comments, or concerns. [facility] belongings in no apparent distress. Transportation home via private vehicle with his wife.     Physical Findings: AIMS: Facial and Oral Movements Muscles of Facial Expression: None, normal Lips and Perioral Area: None, normal Jaw: None, normal Tongue: None, normal,Extremity Movements Upper (arms, wrists, hands, fingers): None, normal Lower (legs, knees, ankles, toes): None, normal, Trunk Movements Neck, shoulders, hips: None, normal, Overall Severity Severity of abnormal movements (highest score from questions above): None, normal Incapacitation due to abnormal movements:  None, normal Patient's awareness of abnormal movements (rate only patient's report): No Awareness, Dental Status Current problems with teeth and/or dentures?: No Does patient usually wear dentures?: No  CIWA:    COWS:     Musculoskeletal: Strength & Muscle Tone: within normal limits Gait & Station: normal Patient leans: N/A  Psychiatric Specialty Exam:  Presentation  General Appearance: Appropriate for Environment; Neat  Eye Contact:Good  Speech:Clear and Coherent; Normal Rate  Speech Volume:Normal  Handedness:Right  Mood and Affect  Mood:Euthymic  Affect:Full Range  Thought Process  Thought Processes:Coherent; Goal Directed  Descriptions of Associations:Intact  Orientation:Full (Time, Place and Person)  Thought Content:Logical (  Decreased focus on concerns about relationship)  History of Schizophrenia/Schizoaffective disorder:No  Duration of Psychotic Symptoms:No data recorded Hallucinations:Hallucinations: None  Ideas of Reference:None  Suicidal Thoughts:Suicidal Thoughts: No  Homicidal Thoughts:Homicidal Thoughts: No  Sensorium  Memory:Immediate Good; Recent Good  Judgment:Good  Insight:Fair  Executive Functions  Concentration:Good  Attention Span:Good  Recall:Good  Fund of Knowledge:Good  Language:Good  Psychomotor Activity  Psychomotor Activity:Psychomotor Activity: Normal  Assets  Assets:Communication Skills; Desire for Improvement; Housing; Physical Health; Social Support; Talents/Skills; Vocational/Educational  Sleep  Sleep:Sleep: Good Number of Hours of Sleep: 5.75  Physical Exam: Physical Exam Constitutional:      Appearance: Normal appearance.  HENT:     Head: Normocephalic.  Pulmonary:     Effort: Pulmonary effort is normal.  Musculoskeletal:        General: Normal range of motion.     Cervical back: Normal range of motion.  Neurological:     General: No focal deficit present.     Mental Status: He is alert and  oriented to person, place, and time.  Psychiatric:        Attention and Perception: Attention and perception normal. He does not perceive auditory or visual hallucinations.        Mood and Affect: Mood normal.        Speech: Speech normal.        Behavior: Behavior normal. Behavior is cooperative.        Thought Content: Thought content normal. Thought content does not include suicidal ideation. Thought content does not include suicidal plan.        Cognition and Memory: Cognition normal.   Review of Systems  Constitutional: Negative.  Negative for fever.  HENT: Negative.  Negative for congestion, sinus pain and sore throat.   Respiratory:  Negative for cough and shortness of breath.   Cardiovascular: Negative.  Negative for chest pain.  Gastrointestinal: Negative.   Genitourinary: Negative.   Musculoskeletal: Negative.   Neurological: Negative.    Blood pressure 107/75, pulse 84, temperature 98.2 F (36.8 C), temperature source Oral, resp. rate 18, height  (1.803 m), weight 96 kg, SpO2 99 %. Body mass index is 29.52 kg/m.   Social History   Tobacco Use  Smoking Status Every Day   Packs/day: 1.00   Types: Cigarettes  Smokeless Tobacco Never   Tobacco Cessation:  N/A, patient does not currently use tobacco products   Blood Alcohol level:  Lab Results  Component Value Date   ETH <10 01/20/2021    Metabolic Disorder Labs:  Lab Results  Component Value Date   HGBA1C 5.5 01/22/2021   MPG 111 01/22/2021   No results found for: PROLACTIN Lab Results  Component Value Date   CHOL 238 (H) 01/22/2021   TRIG 556 (H) 01/22/2021   HDL 25 (L) 01/22/2021   CHOLHDL 9.5 01/22/2021   VLDL UNABLE TO CALCULATE IF TRIGLYCERIDE OVER 400 mg/dL 16/03/9603   LDLCALC UNABLE TO CALCULATE IF TRIGLYCERIDE OVER 400 mg/dL 54/02/8118   LDLCALC 57 02/19/2020    See Psychiatric Specialty Exam and Suicide Risk Assessment completed by Attending Physician prior to discharge.  Discharge  destination:  Home  Is patient on multiple antipsychotic therapies at discharge:  No   Do you recommend tapering to monotherapy for antipsychotics?  NA   Has Patient had three or more failed trials of antipsychotic monotherapy by history:  No  Recommended Plan for Multiple Antipsychotic Therapies: NA  Discharge Instructions     Diet - low sodium heart healthy  Complete by: As directed    Increase activity slowly   Complete by: As directed       Allergies as of 01/25/2021   No Known Allergies      Medication List     STOP taking these medications    hydrOXYzine 50 MG capsule Commonly known as: VISTARIL   loratadine-pseudoephedrine 10-240 MG 24 hr tablet Commonly known as: CLARITIN-D 24-hour   omeprazole 40 MG capsule Commonly known as: PRILOSEC Replaced by: pantoprazole 40 MG tablet       TAKE these medications      Indication  Cholecalciferol 25 MCG (1000 UT) capsule Take 1,000 Units by mouth 2 (two) times daily.  Indication: Vitamin D Deficiency   lamoTRIgine 200 MG tablet Commonly known as: LAMICTAL Take 1 tablet (200 mg total) by mouth daily.  Indication: Manic-Depression   lurasidone 40 MG Tabs tablet Commonly known as: LATUDA Take 1 tablet (40 mg total) by mouth daily with breakfast. Start taking on: January 26, 2021  Indication: Depressive Phase of Manic-Depression   mirtazapine 15 MG tablet Commonly known as: Remeron Take 1 tablet (15 mg total) by mouth at bedtime.  Indication: Major Depressive Disorder   niacin 1000 MG CR tablet Commonly known as: NIASPAN Take 1,000 mg by mouth in the morning and at bedtime.  Indication: High Amount of Fats in the Blood   pantoprazole 40 MG tablet Commonly known as: PROTONIX Take 2 tablets (80 mg total) by mouth daily. Start taking on: January 26, 2021 Replaces: omeprazole 40 MG capsule  Indication: Gastroesophageal Reflux Disease        Follow-up Information     [facility]. Go on 01/27/2021.   Specialty: Behavioral Health Why: You have an appointment for therapy services on 01/27/21 at 11:00 am. This appointment will be held in person.  You also have a Virtual appointment for medication management on 03/01/21 at 4:00 pm. Contact information: [address] [place] [address] [phone]                Follow-up recommendations:  Activity:  as tolerated Diet:  Heart Healthy  Comments:  Prescriptions were sent to pharmacy on file at discharge.  Patient is agreeable with the discharge plan.  He was given an opportunity to ask questions.  He appears to feel comfortable with discharge and denies any current suicidal or homicidal thoughts.   Patient is instructed prior to discharge to: Take all medications as prescribed by his mental healthcare provider. Report any adverse effects and or reactions from the medicines to his outpatient provider promptly. Patient has been instructed & cautioned: To not engage in alcohol and or illegal drug use while on prescription medicines. In the event of worsening symptoms, patient is instructed to call the crisis hotline, 911 and or go to the nearest ED for appropriate evaluation and treatment of symptoms. To follow-up with his primary care provider for your other medical issues, concerns and or health care needs.   Signed: Laveda Abbe, NP 01/25/2021, 11:13 AM

## 2021-01-25 NOTE — [facility] Suicide Risk Assessment (Signed)
[facility] Discharge Suicide Risk Assessment   Principal Problem: Severe episode of recurrent major depressive disorder, without psychotic features (HCC) Discharge Diagnoses: Principal Problem:   Severe episode of recurrent major depressive disorder, without psychotic features (HCC) Active Problems:   PTSD (post-traumatic stress disorder)   Anxiety disorder, unspecified   Total Time spent with patient: 20 minutes  Musculoskeletal: Strength & Muscle Tone: within normal limits Gait & Station: normal Patient leans: N/A  Psychiatric Specialty Exam  Presentation  General Appearance: Appropriate for Environment; Neat  Eye Contact:Good  Speech:Clear and Coherent; Normal Rate  Speech Volume:Normal  Handedness:Right   Mood and Affect  Mood:Euthymic  Duration of Depression Symptoms: Greater than two weeks  Affect:Full Range   Thought Process  Thought Processes:Coherent; Goal Directed  Descriptions of Associations:Intact  Orientation:Full (Time, Place and Person)  Thought Content:Logical (Decreased focus on concerns about relationship)  History of Schizophrenia/Schizoaffective disorder:No  Duration of Psychotic Symptoms:No data recorded Hallucinations:Hallucinations: None  Ideas of Reference:None  Suicidal Thoughts:Suicidal Thoughts: No  Homicidal Thoughts:Homicidal Thoughts: No   Sensorium  Memory:Immediate Good; Recent Good  Judgment:Good  Insight:Fair   Executive Functions  Concentration:Good  Attention Span:Good  Recall:Good  Fund of Knowledge:Good  Language:Good   Psychomotor Activity  Psychomotor Activity:Psychomotor Activity: Normal   Assets  Assets:Communication Skills; Desire for Improvement; Housing; Physical Health; Social Support; Talents/Skills; Vocational/Educational   Sleep  Sleep:Sleep: Good Number of Hours of Sleep: 5.75   Physical Exam: Physical Exam Vitals and nursing note reviewed.  Constitutional:      General: He is  not in acute distress.    Appearance: Normal appearance. He is not diaphoretic.  HENT:     Head: Normocephalic and atraumatic.  Cardiovascular:     Rate and Rhythm: Normal rate.  Pulmonary:     Effort: Pulmonary effort is normal.  Neurological:     General: No focal deficit present.     Mental Status: He is alert and oriented to person, place, and time.   Review of Systems  Constitutional: Negative.  Negative for chills, diaphoresis and fever.  HENT:  Negative for sore throat.   Respiratory:  Negative for cough and shortness of breath.   Cardiovascular:  Negative for chest pain and palpitations.  Gastrointestinal:  Negative for constipation, diarrhea and vomiting.       Positive for mild transient nausea after taking medications  Musculoskeletal: Negative.   Skin:  Negative for rash.  Neurological:  Negative for dizziness, tremors, seizures and headaches.  Psychiatric/Behavioral:  Negative for depression, hallucinations, substance abuse and suicidal ideas. The patient is not nervous/anxious and does not have insomnia.   Blood pressure 107/75, pulse 84, temperature 98.2 F (36.8 C), temperature source Oral, resp. rate 18, height 5\' 11"  (1.803 m), weight 96 kg, SpO2 99 %. Body mass index is 29.52 kg/m.  Mental Status Per Nursing Assessment::   On Admission:  Suicide plan, Suicidal ideation indicated by patient  Demographic Factors:  Male  Loss Factors: NA  Historical Factors: Prior suicide attempts, Family history of mental illness or substance abuse, and Impulsivity  Risk Reduction Factors:   Responsible for children under 46 years of age, Sense of responsibility to family, Employed, Living with another person, especially a relative, Positive social support, Positive therapeutic relationship, and Positive coping skills or problem solving skills  Continued Clinical Symptoms:  Anxiety - improved Depression - improved, currently euthymic Previous Psychiatric Diagnoses and  Treatments  Cognitive Features That Contribute To Risk:  None    Suicide Risk:  Minimal  acute risk: No identifiable suicidal ideation.  Patients presenting with no risk factors but with morbid ruminations; may be classified as minimal risk based on the severity of the depressive symptoms   Follow-up Information     [facility]. Go on 01/27/2021.   Specialty: Behavioral Health Why: You have an appointment for therapy services on 01/27/21 at 11:00 am. This appointment will be held in person.  You also have a Virtual appointment for medication management on 03/01/21 at 4:00 pm. Contact information: [address] [place] [address] [phone]                Plan Of Care/Follow-up recommendations:  Activity:  as tolerated  Tests:  You will periodically need to have blood drawn for lab work to monitor your cholesterol and blood sugar while you are taking Latuda/lurasidone.  Your outpatient doctor will let you know when lab work needs to be performed.  Other:   -Take medications as prescribed.   -Do not drink alcohol.  Do not use marijuana/cannabis or other drugs.   -Keep outpatient mental health follow-up appointments with therapist and psychiatrist.  -See your primary care provider for treatment of medical conditions.  Claudie Revering, MD 01/25/2021, 10:08 AM

## 2021-01-26 ENCOUNTER — Other Ambulatory Visit: Payer: Self-pay | Admitting: Behavioral Health

## 2021-01-26 MED ORDER — RISPERIDONE 1 MG PO TABS: 1.0000 mg | ORAL_TABLET | Freq: Every day | ORAL | 0 refills | Status: DC

## 2021-01-27 ENCOUNTER — Other Ambulatory Visit: Payer: Self-pay

## 2021-01-27 ENCOUNTER — Ambulatory Visit (INDEPENDENT_AMBULATORY_CARE_PROVIDER_SITE_OTHER): Payer: Commercial Managed Care - PPO | Admitting: Clinical

## 2021-01-27 DIAGNOSIS — Z630 Problems in relationship with spouse or partner: Secondary | ICD-10-CM | POA: Diagnosis not present

## 2021-01-27 DIAGNOSIS — F419 Anxiety disorder, unspecified: Secondary | ICD-10-CM

## 2021-01-27 DIAGNOSIS — F4310 Post-traumatic stress disorder, unspecified: Secondary | ICD-10-CM | POA: Diagnosis not present

## 2021-01-27 DIAGNOSIS — F331 Major depressive disorder, recurrent, moderate: Secondary | ICD-10-CM | POA: Diagnosis not present
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
## 2021-02-23 ENCOUNTER — Telehealth (HOSPITAL_COMMUNITY): Payer: Self-pay | Admitting: Psychiatry

## 2021-02-24 ENCOUNTER — Ambulatory Visit (HOSPITAL_COMMUNITY): Payer: Commercial Managed Care - PPO | Admitting: Clinical

## 2021-02-24 ENCOUNTER — Other Ambulatory Visit: Payer: Self-pay

## 2021-03-01 ENCOUNTER — Other Ambulatory Visit: Payer: Self-pay

## 2021-03-01 ENCOUNTER — Encounter (HOSPITAL_COMMUNITY): Payer: Self-pay | Admitting: Psychiatry

## 2021-03-01 ENCOUNTER — Telehealth (INDEPENDENT_AMBULATORY_CARE_PROVIDER_SITE_OTHER): Payer: Commercial Managed Care - PPO | Admitting: Psychiatry

## 2021-03-01 VITALS — Wt 211.0 lb

## 2021-03-01 DIAGNOSIS — Z630 Problems in relationship with spouse or partner: Secondary | ICD-10-CM

## 2021-03-01 DIAGNOSIS — F4310 Post-traumatic stress disorder, unspecified: Secondary | ICD-10-CM

## 2021-03-01 DIAGNOSIS — F331 Major depressive disorder, recurrent, moderate: Secondary | ICD-10-CM | POA: Diagnosis not present

## 2021-03-01 MED ORDER — MIRTAZAPINE 15 MG PO TABS: 15.0000 mg | ORAL_TABLET | Freq: Every day | ORAL | 0 refills | Status: DC

## 2021-03-01 MED ORDER — HYDROXYZINE PAMOATE 25 MG PO CAPS: 25.0000 mg | ORAL_CAPSULE | Freq: Three times a day (TID) | ORAL | 0 refills | Status: DC | PRN

## 2021-03-01 MED ORDER — LAMOTRIGINE 200 MG PO TABS: 200.0000 mg | ORAL_TABLET | Freq: Every day | ORAL | 0 refills | Status: DC

## 2021-03-01 MED ORDER — RISPERIDONE 2 MG PO TABS: 2.0000 mg | ORAL_TABLET | Freq: Every day | ORAL | 0 refills | Status: DC

## 2021-03-01 NOTE — Progress Notes (Signed)
Virtual Visit via Telephone Note  I connected with Harold Collins on 03/01/21 at  4:00 PM EDT by telephone and verified that I am speaking with the correct person using two identifiers.  Location: Patient: In car Provider: Home Office   I discussed the limitations, risks, security and privacy concerns of performing an evaluation and management service by telephone and the availability of in person appointments. I also discussed with the patient that there may be a patient responsible charge related to this service. The patient expressed understanding and agreed to proceed.   History of Present Illness: Patient is evaluated by phone session.  He was admitted in the hospital in July after taking overdose on mirtazapine, hydroxyzine and Lamictal.  Patient is not sure the amount but has per the chart he took 20 tablets of hydroxyzine and 15 tablets of mirtazapine.  Patient told it was an impulsive decision after having an argument with his wife.  Patient told wife is not truthful about the events and he was suspicious about being unfaithful of his wife.  Patient denies any drug use or alcohol use.  He denies any suicidal thought but is still have paranoia, ruminative thoughts and having difficulty trusting his wife.  In the hospital Latuda was started but patient could not afford and now he is taking Risperdal 1 mg at bedtime.  He reported it helped but is still have residual paranoia and irritability.  Upon discharge he was recommended therapy from [facility] but he felt it did not help and now is looking for a new therapist.  Patient admitted seeing a marriage counselor but not sure about the future.  Together they have a 81-year-old son.  Denies any suicidal thoughts, homicidal thought, anger or any severe mood swings but admitted difficulty trusting his wife.  His appetite is okay.  Some nights that he is struggling with sleep.  He has no rash, itching, tremors or shakes.  He is taking hydroxyzine  that helps his anxiety.  His job is busy but is working every day.  So far he feels the work is manageable.  He has residual nightmares and flashback.  However he denies any major panic attack.  Past Psychiatric History:  H/O depression as a teenager. H/O cut himself and overdose but no inpatient treatment.  H/O severe mood swing, night mares, anger,  fights and depression.  While in [employer] spend 10 days jail due to assault charges. Tried Paxil, BuSpar, Lexapro, Prozac, Brintellix and Effexor.  Trazodone did not help for sleep.  Seroquel caused increased triglycerides.  We did gene testing Prozac and Lamictal are favorable.   Recent Results (from the past 2160 hour(s))  CBC with Differential     Status: None   Collection Time: 01/20/21 11:18 PM  Result Value Ref Range   WBC 7.3 4.0 - 10.5 K/uL   RBC 4.77 4.22 - 5.81 MIL/uL   Hemoglobin 15.3 13.0 - 17.0 g/dL   HCT 13.2 44.0 - 10.2 %   MCV 90.6 80.0 - 100.0 fL   MCH 32.1 26.0 - 34.0 pg   MCHC 35.4 30.0 - 36.0 g/dL   RDW 72.5 36.6 - 44.0 %   Platelets 338 150 - 400 K/uL   nRBC 0.0 0.0 - 0.2 %   Neutrophils Relative % 56 %   Neutro Abs 4.1 1.7 - 7.7 K/uL   Lymphocytes Relative 29 %   Lymphs Abs 2.1 0.7 - 4.0 K/uL   Monocytes Relative 10 %   Monocytes Absolute  0.7 0.1 - 1.0 K/uL   Eosinophils Relative 4 %   Eosinophils Absolute 0.3 0.0 - 0.5 K/uL   Basophils Relative 0 %   Basophils Absolute 0.0 0.0 - 0.1 K/uL   Immature Granulocytes 1 %   Abs Immature Granulocytes 0.07 0.00 - 0.07 K/uL    Comment: Performed at [facility], [address] [place], [place] [address]  Comprehensive metabolic panel     Status: Abnormal   Collection Time: 01/20/21 11:18 PM  Result Value Ref Range   Sodium 139 135 - 145 mmol/L   Potassium 3.8 3.5 - 5.1 mmol/L   Chloride 100 98 - 111 mmol/L   CO2 30 22 - 32 mmol/L   Glucose, Bld 110 (H) 70 - 99 mg/dL    Comment: Glucose reference range applies only to samples taken after fasting for at  least 8 hours.   BUN 14 6 - 20 mg/dL   Creatinine, Ser 1.911.22 0.61 - 1.24 mg/dL   Calcium 9.2 8.9 - 47.810.3 mg/dL   Total Protein 7.2 6.5 - 8.1 g/dL   Albumin 4.6 3.5 - 5.0 g/dL   AST 22 15 - 41 U/L   ALT 23 0 - 44 U/L   Alkaline Phosphatase 61 38 - 126 U/L   Total Bilirubin 0.5 0.3 - 1.2 mg/dL   GFR, Estimated >29>60 >56>60 mL/min    Comment: (NOTE) Calculated using the CKD-EPI Creatinine Equation (2021)    Anion gap 9 5 - 15    Comment: Performed at [facility], [address] [place], [place] [address]  Ethanol     Status: None   Collection Time: 01/20/21 11:18 PM  Result Value Ref Range   Alcohol, Ethyl (B) <10 <10 mg/dL    Comment: (NOTE) Lowest detectable limit for serum alcohol is 10 mg/dL.  For medical purposes only. Performed at [facility], [address] [place], [place] [address]   Salicylate level     Status: Abnormal   Collection Time: 01/20/21 11:18 PM  Result Value Ref Range   Salicylate Lvl <7.0 (L) 7.0 - 30.0 mg/dL    Comment: Performed at [facility], [address] [place], [place] [address]  Acetaminophen level     Status: Abnormal   Collection Time: 01/20/21 11:18 PM  Result Value Ref Range   Acetaminophen (Tylenol), Serum <10 (L) 10 - 30 ug/mL    Comment: (NOTE) Therapeutic concentrations vary significantly. A range of 10-30 ug/mL  may be an effective concentration for many patients. However, some  are best treated at concentrations outside of this range. Acetaminophen concentrations >150 ug/mL at 4 hours after ingestion  and >50 ug/mL at 12 hours after ingestion are often associated with  toxic reactions.  Performed at [facility], [address] [place], [place] [address]   Resp Panel by RT-PCR (Flu A&B, Covid) Nasopharyngeal Swab     Status: None   Collection Time: 01/21/21  1:04 AM   Specimen: Nasopharyngeal Swab; Nasopharyngeal(NP) swabs in vial transport medium   Result Value Ref Range   SARS Coronavirus 2 by RT PCR NEGATIVE NEGATIVE    Comment: (NOTE) SARS-CoV-2 target nucleic acids are NOT DETECTED.  The SARS-CoV-2 RNA is generally detectable in upper respiratory specimens during the acute phase of infection. The lowest concentration of SARS-CoV-2 viral copies this assay can detect is 138 copies/mL. A negative result does not preclude SARS-Cov-2 infection and should not be used as the sole basis  for treatment or other patient management decisions. A negative result may occur with  improper specimen collection/handling, submission of specimen other than nasopharyngeal swab, presence of viral mutation(s) within the areas targeted by this assay, and inadequate number of viral copies(<138 copies/mL). A negative result must be combined with clinical observations, patient history, and epidemiological information. The expected result is Negative.  Fact Sheet for Patients:  BloggerCourse.com  Fact Sheet for Healthcare Providers:  SeriousBroker.it  This test is no t yet approved or cleared by the [place] FDA and  has been authorized for detection and/or diagnosis of SARS-CoV-2 by FDA under an Emergency Use Authorization (EUA). This EUA will remain  in effect (meaning this test can be used) for the duration of the COVID-19 declaration under Section 564(b)(1) of the Act, 21 U.S.C.section 360bbb-3(b)(1), unless the authorization is terminated  or revoked sooner.       Influenza A by PCR NEGATIVE NEGATIVE   Influenza B by PCR NEGATIVE NEGATIVE    Comment: (NOTE) The Xpert Xpress SARS-CoV-2/FLU/RSV plus assay is intended as an aid in the diagnosis of influenza from Nasopharyngeal swab specimens and should not be used as a sole basis for treatment. Nasal washings and aspirates are unacceptable for Xpert Xpress SARS-CoV-2/FLU/RSV testing.  Fact Sheet for  Patients: BloggerCourse.com  Fact Sheet for Healthcare Providers: SeriousBroker.it  This test is not yet approved or cleared by the [place] FDA and has been authorized for detection and/or diagnosis of SARS-CoV-2 by FDA under an Emergency Use Authorization (EUA). This EUA will remain in effect (meaning this test can be used) for the duration of the COVID-19 declaration under Section 564(b)(1) of the Act, 21 U.S.C. section 360bbb-3(b)(1), unless the authorization is terminated or revoked.  Performed at [facility], [address], [place], [place] [address]   Rapid urine drug screen (hospital performed)     Status: None   Collection Time: 01/21/21  1:08 AM  Result Value Ref Range   Opiates NONE DETECTED NONE DETECTED   Cocaine NONE DETECTED NONE DETECTED   Benzodiazepines NONE DETECTED NONE DETECTED   Amphetamines NONE DETECTED NONE DETECTED   Tetrahydrocannabinol NONE DETECTED NONE DETECTED   Barbiturates NONE DETECTED NONE DETECTED    Comment: (NOTE) DRUG SCREEN FOR MEDICAL PURPOSES ONLY.  IF CONFIRMATION IS NEEDED FOR ANY PURPOSE, NOTIFY LAB WITHIN 5 DAYS.  LOWEST DETECTABLE LIMITS FOR URINE DRUG SCREEN Drug Class                     Cutoff (ng/mL) Amphetamine and metabolites    1000 Barbiturate and metabolites    200 Benzodiazepine                 200 Tricyclics and metabolites     300 Opiates and metabolites        300 Cocaine and metabolites        300 THC                            50 Performed at [facility], [address] [place], [place] [address]   Hemoglobin A1c     Status: None   Collection Time: 01/22/21  6:16 AM  Result Value Ref Range   Hgb A1c MFr Bld 5.5 4.8 - 5.6 %    Comment: (NOTE)         Prediabetes: 5.7 - 6.4         Diabetes: >  6.4         Glycemic control for adults with diabetes: <7.0    Mean Plasma Glucose 111 mg/dL    Comment:  (NOTE) Performed At: [facility] [address] [place], [place] [id number] Jolene Schimke MD ZG:[id number]   Lipid panel     Status: Abnormal   Collection Time: 01/22/21  6:16 AM  Result Value Ref Range   Cholesterol 238 (H) 0 - 200 mg/dL   Triglycerides 591 (H) <150 mg/dL   HDL 25 (L) >63 mg/dL   Total CHOL/HDL Ratio 9.5 RATIO   VLDL UNABLE TO CALCULATE IF TRIGLYCERIDE OVER 400 mg/dL 0 - 40 mg/dL   LDL Cholesterol UNABLE TO CALCULATE IF TRIGLYCERIDE OVER 400 mg/dL 0 - 99 mg/dL    Comment:        Total Cholesterol/HDL:CHD Risk Coronary Heart Disease Risk Table                     Men   Women  1/2 Average Risk   3.4   3.3  Average Risk       5.0   4.4  2 X Average Risk   9.6   7.1  3 X Average Risk  23.4   11.0        Use the calculated Patient Ratio above and the CHD Risk Table to determine the patient's CHD Risk.        ATP III CLASSIFICATION (LDL):  <100     mg/dL   Optimal  846-659  mg/dL   Near or Above                    Optimal  130-159  mg/dL   Borderline  935-701  mg/dL   High  >779     mg/dL   Very High Performed at [facility], [address] [place], [place] [address]   TSH     Status: None   Collection Time: 01/22/21  6:16 AM  Result Value Ref Range   TSH 1.378 0.350 - 4.500 uIU/mL    Comment: Performed by a 3rd Generation assay with a functional sensitivity of <=0.01 uIU/mL. Performed at [facility], [address], [place], [place] [address]   LDL cholesterol, direct     Status: None   Collection Time: 01/22/21  6:16 AM  Result Value Ref Range   Direct LDL 95.7 0 - 99 mg/dL    Comment: Performed at [facility], [address], [place], [place] [address]  Glucose, capillary     Status: None   Collection Time: 01/22/21 11:32 AM  Result Value Ref Range   Glucose-Capillary 93 70 - 99 mg/dL    Comment: Glucose reference range applies only to samples taken after fasting for at least 8 hours.   Glucose, capillary     Status: Abnormal   Collection Time: 01/23/21 10:01 AM  Result Value Ref Range   Glucose-Capillary 101 (H) 70 - 99 mg/dL    Comment: Glucose reference range applies only to samples taken after fasting for at least 8 hours.   Comment 1 Notify RN    Comment 2 Document in Chart   Glucose, capillary     Status: Abnormal   Collection Time: 01/25/21  6:43 AM  Result Value Ref Range   Glucose-Capillary 121 (H) 70 - 99 mg/dL    Comment: Glucose reference range applies only to samples taken after fasting for at least 8  hours.      Psychiatric Specialty Exam: Physical Exam  Review of Systems  Weight 211 lb (95.7 kg).There is no height or weight on file to calculate BMI.  General Appearance: NA  Eye Contact:  NA  Speech:  Slow  Volume:  Decreased  Mood:  Anxious, Dysphoric, and Irritable  Affect:  NA  Thought Process:  Descriptions of Associations: Intact  Orientation:  Full (Time, Place, and Person)  Thought Content:  Paranoid Ideation and Rumination  Suicidal Thoughts:  No  Homicidal Thoughts:  No  Memory:  Immediate;   Good Recent;   Good Remote;   Good  Judgement:  Fair  Insight:  Present  Psychomotor Activity:  NA  Concentration:  Concentration: Good and Attention Span: Good  Recall:  Good  Fund of Knowledge:  Good  Language:  Good  Akathisia:  No  Handed:  Right  AIMS (if indicated):     Assets:  Communication Skills Desire for Improvement Housing Resilience Talents/Skills Transportation  ADL's:  Intact  Cognition:  WNL  Sleep:   fair      Assessment and Plan: Major depressive disorder, recurrent.  PTSD.  Marital stress   I review discharge summary, current medication and blood work results from recent hospitalization.  He is now taking Risperdal 1 mg which helps some of his symptoms but is still struggling with paranoia, ruminative thoughts.  We talked about seeing individual therapist to help his coping skills.  Patient also started  marriage counseling but not sure about the future.  So far he is tolerating his medication and reported no rash, itching, tremors or shakes.  I recommend to increase the Risperdal to 2 mg at bedtime and continue hydroxyzine 25 mg up to 3 times a day, mirtazapine 15 mg at bedtime and Lamictal 200 mg daily.  I recommend if he cannot find a therapist then he should call us and we can schedule appointment or refer him for therapy.  Patient agreed with the plan.  Discussed safety concerns and anytime having active suicidal thoughts or homicidal thought that he need to call 911 or go to local classroom.  Follow up in 4 weeks.  Follow Up Instructions:    I discussed the assessment and treatment plan with the patient. The patient was provided an opportunity to ask questions and all were answered. The patient agreed with the plan and demonstrated an understanding of the instructions.   The patient was advised to call back or seek an in-person evaluation if the symptoms worsen or if the condition fails to improve as anticipated.  I provided 35 minutes of non-face-to-face time during this encounter.   Cleotis Nipper, MD

## 2021-03-20 ENCOUNTER — Other Ambulatory Visit: Payer: Self-pay | Admitting: Gastroenterology

## 2021-03-29 ENCOUNTER — Telehealth (HOSPITAL_COMMUNITY): Payer: Commercial Managed Care - PPO | Admitting: Psychiatry

## 2021-04-03 ENCOUNTER — Other Ambulatory Visit (HOSPITAL_COMMUNITY): Payer: Self-pay | Admitting: Psychiatry

## 2021-04-03 DIAGNOSIS — F331 Major depressive disorder, recurrent, moderate: Secondary | ICD-10-CM

## 2021-04-12 ENCOUNTER — Encounter (HOSPITAL_COMMUNITY): Payer: Self-pay | Admitting: Psychiatry

## 2021-04-12 ENCOUNTER — Telehealth (HOSPITAL_BASED_OUTPATIENT_CLINIC_OR_DEPARTMENT_OTHER): Payer: Commercial Managed Care - PPO | Admitting: Psychiatry

## 2021-04-12 ENCOUNTER — Other Ambulatory Visit: Payer: Self-pay

## 2021-04-12 VITALS — Wt 214.0 lb

## 2021-04-12 DIAGNOSIS — Z630 Problems in relationship with spouse or partner: Secondary | ICD-10-CM | POA: Diagnosis not present

## 2021-04-12 DIAGNOSIS — F4310 Post-traumatic stress disorder, unspecified: Secondary | ICD-10-CM

## 2021-04-12 DIAGNOSIS — F331 Major depressive disorder, recurrent, moderate: Secondary | ICD-10-CM

## 2021-04-12 MED ORDER — HYDROXYZINE PAMOATE 25 MG PO CAPS: 25.0000 mg | ORAL_CAPSULE | Freq: Three times a day (TID) | ORAL | 0 refills | Status: DC | PRN

## 2021-04-12 MED ORDER — ARIPIPRAZOLE 5 MG PO TABS: 5.0000 mg | ORAL_TABLET | Freq: Every day | ORAL | 0 refills | Status: DC

## 2021-04-12 MED ORDER — LAMOTRIGINE 200 MG PO TABS: 200.0000 mg | ORAL_TABLET | Freq: Every day | ORAL | 0 refills | Status: DC

## 2021-04-12 MED ORDER — MIRTAZAPINE 15 MG PO TABS: 15.0000 mg | ORAL_TABLET | Freq: Every day | ORAL | 0 refills | Status: DC

## 2021-04-12 NOTE — Progress Notes (Signed)
Virtual Visit via Telephone Note  I connected with [name] on 04/12/21 at  3:20 PM EDT by telephone and verified that I am speaking with the correct person using two identifiers.  Location: Patient: Home Provider: Home Office    I discussed the limitations, risks, security and privacy concerns of performing an evaluation and management service by telephone and the availability of in person appointments. I also discussed with the patient that there may be a patient responsible charge related to this service. The patient expressed understanding and agreed to proceed.   History of Present Illness: Patient is evaluated by phone session.  On the last visit we increased Risperdal 2 mg but he has not seen any improvement.  He admitted at work sometimes he feels people calling his name.  He feels restless at night and continues to have flashback and nightmares.  However he admitted not looking anything around his wife and not checking her food records.  Patient and his wife started marriage counseling and he has a plan to continue.  He has not started his own individual counseling.  Today he was very upset at work and he threatened to quit the job when his supervisor refused to give timeout which she was scheduled to have her cruise trip 2-1/2 years ago.  Patient told his supervisor told that his time was used when he was in the hospital in July.  After he threatened to quit the job his supervisor agreed to work with him and his time off was granted.  Patient like to try a different medication because he feels the Risperdal not helping as much.  He is also taking hydroxyzine, mirtazapine, Lamictal.  He has no rash or itching, tremors or shakes.  His appetite is okay.  His weight is stable.   Past Psychiatric History:  H/O depression as a teenager. H/O cut himself and overdose but no inpatient treatment.  H/O severe mood swing, night mares, anger,  fights and depression.  While in [employer] spend 10 days jail  due to assault charges. Tried Paxil, BuSpar, Lexapro, Brintellix and Effexor.  Trazodone did not help for sleep.  Seroquel caused increased triglycerides.  We did gene testing Prozac and Lamictal are favorable.    Psychiatric Specialty Exam: Physical Exam  Review of Systems  Weight 214 lb (97.1 kg).There is no height or weight on file to calculate BMI.  General Appearance: NA  Eye Contact:  NA  Speech:  Slow  Volume:  Decreased  Mood:  Depressed and Irritable  Affect:  NA  Thought Process:  Descriptions of Associations: Intact  Orientation:  Full (Time, Place, and Person)  Thought Content:  Hallucinations: Auditory People calling his name and Paranoid Ideation  Suicidal Thoughts:  No  Homicidal Thoughts:  No  Memory:  Immediate;   Good Recent;   Good Remote;   Fair  Judgement:  Intact  Insight:  Present  Psychomotor Activity:  NA  Concentration:  Concentration: Fair and Attention Span: Fair  Recall:  Good  Fund of Knowledge:  Good  Language:  Good  Akathisia:  No  Handed:  Right  AIMS (if indicated):     Assets:  Communication Skills Desire for Improvement Housing Talents/Skills Transportation  ADL's:  Intact  Cognition:  WNL  Sleep:   fair, tired, restless      Assessment and Plan: Major depressive disorder, recurrent.  PTSD.  Marital stress.  I reviewed current medication.  Patient do not see any huge improvement with the Risperdal  which was started on the last admission when he took the overdose.  He has never tried Abilify and I recommend to try Abilify 5 mg and stop the Risperdal.  I encouraged consider individual counseling and patient promised to consider it.  He is doing marriage counseling and he like to keep it.  Discussed safety concerns and anytime having active suicidal thoughts or homicidal thought that he need to call 911 or go to local emergency room.  For now continue hydroxyzine 25 mg 3 times a day, mirtazapine 15 mg at bedtime, Lamictal 200 mg daily  and we will start Abilify 5 mg daily.  Follow up in 3 weeks and if patient do not improve we will consider Symbyax.  To have a follow-up with the PCP as patient has a history of high triglycerides and cholesterol.  Follow Up Instructions:    I discussed the assessment and treatment plan with the patient. The patient was provided an opportunity to ask questions and all were answered. The patient agreed with the plan and demonstrated an understanding of the instructions.   The patient was advised to call back or seek an in-person evaluation if the symptoms worsen or if the condition fails to improve as anticipated.  I provided 19 minutes of non-face-to-face time during this encounter.   Cleotis Nipper, MD

## 2021-04-19 ENCOUNTER — Encounter: Payer: Self-pay | Admitting: Physician Assistant

## 2021-04-19 ENCOUNTER — Ambulatory Visit (INDEPENDENT_AMBULATORY_CARE_PROVIDER_SITE_OTHER): Payer: Commercial Managed Care - PPO | Admitting: Physician Assistant

## 2021-04-19 ENCOUNTER — Other Ambulatory Visit: Payer: Self-pay

## 2021-04-19 VITALS — BP 102/62 | HR 76 | Temp 98.2°F | Ht 71.0 in | Wt 221.4 lb

## 2021-04-19 DIAGNOSIS — M25561 Pain in right knee: Secondary | ICD-10-CM | POA: Diagnosis not present

## 2021-04-19 DIAGNOSIS — E1169 Type 2 diabetes mellitus with other specified complication: Secondary | ICD-10-CM

## 2021-04-19 DIAGNOSIS — M2560 Stiffness of unspecified joint, not elsewhere classified: Secondary | ICD-10-CM | POA: Diagnosis not present

## 2021-04-19 DIAGNOSIS — E119 Type 2 diabetes mellitus without complications: Secondary | ICD-10-CM

## 2021-04-19 DIAGNOSIS — Z0000 Encounter for general adult medical examination without abnormal findings: Secondary | ICD-10-CM

## 2021-04-19 DIAGNOSIS — M25562 Pain in left knee: Secondary | ICD-10-CM

## 2021-04-19 DIAGNOSIS — E782 Mixed hyperlipidemia: Secondary | ICD-10-CM

## 2021-04-19 DIAGNOSIS — Z23 Encounter for immunization: Secondary | ICD-10-CM

## 2021-04-19 DIAGNOSIS — Z8261 Family history of arthritis: Secondary | ICD-10-CM

## 2021-04-19 DIAGNOSIS — G8929 Other chronic pain: Secondary | ICD-10-CM

## 2021-04-19 DIAGNOSIS — E781 Pure hyperglyceridemia: Secondary | ICD-10-CM

## 2021-04-19 LAB — POCT GLYCOSYLATED HEMOGLOBIN (HGB A1C): Hemoglobin A1C: 5.2 % (ref 4.0–5.6)

## 2021-04-19 LAB — POCT UA - MICROALBUMIN

## 2021-04-19 NOTE — Patient Instructions (Signed)
Preventive Care 21-39 Years Old, Male Preventive care refers to lifestyle choices and visits with your health care provider that can promote health and wellness. This includes: A yearly physical exam. This is also called an annual wellness visit. Regular dental and eye exams. Immunizations. Screening for certain conditions. Healthy lifestyle choices, such as: Eating a healthy diet. Getting regular exercise. Not using drugs or products that contain nicotine and tobacco. Limiting alcohol use. What can I expect for my preventive care visit? Physical exam Your health care provider may check your: Height and weight. These may be used to calculate your BMI (body mass index). BMI is a measurement that tells if you are at a healthy weight. Heart rate and blood pressure. Body temperature. Skin for abnormal spots. Counseling Your health care provider may ask you questions about your: Past medical problems. Family's medical history. Alcohol, tobacco, and drug use. Emotional well-being. Home life and relationship well-being. Sexual activity. Diet, exercise, and sleep habits. Work and work environment. Access to firearms. What immunizations do I need? Vaccines are usually given at various ages, according to a schedule. Your health care provider will recommend vaccines for you based on your age, medical history, and lifestyle or other factors, such as travel or where you work. What tests do I need? Blood tests Lipid and cholesterol levels. These may be checked every 5 years starting at age 20. Hepatitis C test. Hepatitis B test. Screening  Diabetes screening. This is done by checking your blood sugar (glucose) after you have not eaten for a while (fasting). Genital exam to check for testicular cancer or hernias. STD (sexually transmitted disease) testing, if you are at risk. Talk with your health care provider about your test results, treatment options, and if necessary, the need for more  tests. Follow these instructions at home: Eating and drinking  Eat a healthy diet that includes fresh fruits and vegetables, whole grains, lean protein, and low-fat dairy products. Drink enough fluid to keep your urine pale yellow. Take vitamin and mineral supplements as recommended by your health care provider. Do not drink alcohol if your health care provider tells you not to drink. If you drink alcohol: Limit how much you have to 0-2 drinks a day. Be aware of how much alcohol is in your drink. In the [place] one drink equals one 12 oz bottle of beer (355 mL), one 5 oz glass of wine (148 mL), or one 1 oz glass of hard liquor (44 mL). Lifestyle Take daily care of your teeth and gums. Brush your teeth every morning and night with fluoride toothpaste. Floss one time each day. Stay active. Exercise for at least 30 minutes 5 or more days each week. Do not use any products that contain nicotine or tobacco, such as cigarettes, e-cigarettes, and chewing tobacco. If you need help quitting, ask your health care provider. Do not use drugs. If you are sexually active, practice safe sex. Use a condom or other form of protection to prevent STIs (sexually transmitted infections). Find healthy ways to cope with stress, such as: Meditation, yoga, or listening to music. Journaling. Talking to a trusted person. Spending time with friends and family. Safety Always wear your seat belt while driving or riding in a vehicle. Do not drive: If you have been drinking alcohol. Do not ride with someone who has been drinking. When you are tired or distracted. While texting. Wear a helmet and other protective equipment during sports activities. If you have firearms in your house, make sure   you follow all gun safety procedures. Seek help if you have been physically or sexually abused. What's next? Go to your health care provider once a year for an annual wellness visit. Ask your health care provider how often you  should have your eyes and teeth checked. Stay up to date on all vaccines. This information is not intended to replace advice given to you by your health care provider. Make sure you discuss any questions you have with your health care provider. Document Revised: 08/20/2020 Document Reviewed: 06/06/2018 Elsevier Patient Education  2022 [employer]  
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
## 2021-04-22 LAB — COMPREHENSIVE METABOLIC PANEL
ALT: 28 IU/L (ref 0–44)
AST: 21 IU/L (ref 0–40)
Albumin/Globulin Ratio: 2.1 (ref 1.2–2.2)
Albumin: 4.9 g/dL (ref 4.0–5.0)
Alkaline Phosphatase: 67 IU/L (ref 44–121)
BUN/Creatinine Ratio: 9 (ref 9–20)
BUN: 11 mg/dL (ref 6–20)
Bilirubin Total: 0.6 mg/dL (ref 0.0–1.2)
CO2: 22 mmol/L (ref 20–29)
Calcium: 9.4 mg/dL (ref 8.7–10.2)
Chloride: 101 mmol/L (ref 96–106)
Creatinine, Ser: 1.19 mg/dL (ref 0.76–1.27)
Globulin, Total: 2.3 g/dL (ref 1.5–4.5)
Glucose: 93 mg/dL (ref 70–99)
Potassium: 4.5 mmol/L (ref 3.5–5.2)
Sodium: 139 mmol/L (ref 134–144)
Total Protein: 7.2 g/dL (ref 6.0–8.5)
eGFR: 80 mL/min/{1.73_m2} (ref >59)

## 2021-04-22 LAB — CBC WITH DIFFERENTIAL/PLATELET
Basophils Absolute: 0 10*3/uL (ref 0.0–0.2)
Basos: 1 %
EOS (ABSOLUTE): 0.3 10*3/uL (ref 0.0–0.4)
Eos: 5 %
Hematocrit: 45.3 % (ref 37.5–51.0)
Hemoglobin: 16.2 g/dL (ref 13.0–17.7)
Immature Grans (Abs): 0 10*3/uL (ref 0.0–0.1)
Immature Granulocytes: 0 %
Lymphocytes Absolute: 1.5 10*3/uL (ref 0.7–3.1)
Lymphs: 29 %
MCH: 31.9 pg (ref 26.6–33.0)
MCHC: 35.8 g/dL — ABNORMAL HIGH (ref 31.5–35.7)
MCV: 89 fL (ref 79–97)
Monocytes Absolute: 0.4 10*3/uL (ref 0.1–0.9)
Monocytes: 8 %
Neutrophils Absolute: 2.9 10*3/uL (ref 1.4–7.0)
Neutrophils: 57 %
Platelets: 346 10*3/uL (ref 150–450)
RBC: 5.08 x10E6/uL (ref 4.14–5.80)
RDW: 13.5 % (ref 11.6–15.4)
WBC: 5.2 10*3/uL (ref 3.4–10.8)

## 2021-04-22 LAB — ANA W/REFLEX IF POSITIVE: Anti Nuclear Antibody (ANA): NEGATIVE

## 2021-04-22 LAB — RHEUMATOID FACTOR: Rheumatoid fact SerPl-aCnc: <10 IU/mL (ref <14.0)

## 2021-04-22 LAB — LIPID PANEL
Chol/HDL Ratio: 7.3 ratio — ABNORMAL HIGH (ref 0.0–5.0)
Cholesterol, Total: 211 mg/dL — ABNORMAL HIGH (ref 100–199)
HDL: 29 mg/dL — ABNORMAL LOW (ref >39)
LDL Chol Calc (NIH): 93 mg/dL (ref 0–99)
Triglycerides: 535 mg/dL — ABNORMAL HIGH (ref 0–149)
VLDL Cholesterol Cal: 89 mg/dL — ABNORMAL HIGH (ref 5–40)

## 2021-04-22 LAB — CYCLIC CITRUL PEPTIDE ANTIBODY, IGG/IGA: Cyclic Citrullin Peptide Ab: 5 units (ref 0–19)

## 2021-04-22 LAB — SEDIMENTATION RATE: Sed Rate: 2 mm/hr (ref 0–15)

## 2021-04-22 LAB — TSH: TSH: 1.53 u[IU]/mL (ref 0.450–4.500)

## 2021-04-22 LAB — C-REACTIVE PROTEIN: CRP: <1 mg/L (ref 0–10)

## 2021-05-03 ENCOUNTER — Encounter (HOSPITAL_COMMUNITY): Payer: Self-pay | Admitting: Psychiatry

## 2021-05-03 ENCOUNTER — Other Ambulatory Visit: Payer: Self-pay

## 2021-05-03 ENCOUNTER — Telehealth (HOSPITAL_BASED_OUTPATIENT_CLINIC_OR_DEPARTMENT_OTHER): Payer: Commercial Managed Care - PPO | Admitting: Psychiatry

## 2021-05-03 VITALS — Wt 213.0 lb

## 2021-05-03 DIAGNOSIS — F4310 Post-traumatic stress disorder, unspecified: Secondary | ICD-10-CM | POA: Diagnosis not present

## 2021-05-03 DIAGNOSIS — F331 Major depressive disorder, recurrent, moderate: Secondary | ICD-10-CM | POA: Diagnosis not present

## 2021-05-03 DIAGNOSIS — Z630 Problems in relationship with spouse or partner: Secondary | ICD-10-CM | POA: Diagnosis not present

## 2021-05-03 DIAGNOSIS — F419 Anxiety disorder, unspecified: Secondary | ICD-10-CM

## 2021-05-03 MED ORDER — MIRTAZAPINE 15 MG PO TABS: 15.0000 mg | ORAL_TABLET | Freq: Every day | ORAL | 2 refills | Status: DC

## 2021-05-03 MED ORDER — HYDROXYZINE PAMOATE 25 MG PO CAPS: 25.0000 mg | ORAL_CAPSULE | Freq: Three times a day (TID) | ORAL | 2 refills | Status: DC | PRN

## 2021-05-03 MED ORDER — ARIPIPRAZOLE 5 MG PO TABS: 5.0000 mg | ORAL_TABLET | Freq: Every day | ORAL | 2 refills | Status: DC

## 2021-05-03 MED ORDER — LAMOTRIGINE 200 MG PO TABS: 200.0000 mg | ORAL_TABLET | Freq: Every day | ORAL | 2 refills | Status: DC
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
## 2021-05-09 ENCOUNTER — Ambulatory Visit
Admission: EM | Admit: 2021-05-09 | Discharge: 2021-05-09 | Disposition: A | Discharge status: Home / Self Care | Payer: Commercial Managed Care - PPO | Attending: Student | Admitting: Student

## 2021-05-09 ENCOUNTER — Encounter: Payer: Self-pay | Admitting: Emergency Medicine

## 2021-05-09 DIAGNOSIS — R0789 Other chest pain: Secondary | ICD-10-CM | POA: Diagnosis not present

## 2021-05-09 DIAGNOSIS — Z86711 Personal history of pulmonary embolism: Secondary | ICD-10-CM

## 2021-05-09 HISTORY — DX: Other pulmonary embolism without acute cor pulmonale: I26.99

## 2021-05-09 LAB — POCT FASTING CBG KUC MANUAL ENTRY: POCT Glucose (KUC): 90 mg/dL (ref 70–99)
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
## 2021-05-09 NOTE — ED Notes (Signed)
Patient is being discharged from the Urgent Care and sent to the Emergency Department via POV . Per Ignacia Bayley PA, patient is in need of higher level of care due to chest pain, hx of blood clots. Patient is aware and verbalizes understanding of plan of care.  Vitals:   05/09/21 1502  BP: 137/76  Pulse: 74  Resp: 16  Temp: 98 F (36.7 C)  SpO2: 98%

## 2021-05-09 NOTE — Discharge Instructions (Addendum)
Head to ED for further evaluation and management of possible PE. This is a medical emergency, please head straight there.
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
## 2021-05-28 ENCOUNTER — Other Ambulatory Visit: Payer: Self-pay | Admitting: Gastroenterology

## 2021-07-03 ENCOUNTER — Other Ambulatory Visit: Payer: Self-pay | Admitting: Gastroenterology

## 2021-07-07 ENCOUNTER — Other Ambulatory Visit: Payer: Self-pay | Admitting: Gastroenterology

## 2021-07-08 ENCOUNTER — Telehealth: Payer: Self-pay | Admitting: Physician Assistant

## 2021-07-08 ENCOUNTER — Telehealth: Payer: Self-pay | Admitting: Gastroenterology

## 2021-07-08 MED ORDER — OMEPRAZOLE 40 MG PO CPDR: DELAYED_RELEASE_CAPSULE | ORAL | 0 refills | Status: DC

## 2021-07-08 NOTE — Telephone Encounter (Signed)
Patient called stating that pharmacy would not refill his Prilosec. Patient was scheduled for an OV for medication refill. Seeking advice if there is anyway he can get some to take in the mean time. Please advise.

## 2021-07-08 NOTE — Telephone Encounter (Signed)
Patient request a refill of his omeprazole. Please advise. [phone]

## 2021-07-08 NOTE — Telephone Encounter (Signed)
This medication comes from his [place] provider. Left patient a msg advising him to reach out to them for refill request. AS, CMA

## 2021-07-08 NOTE — Telephone Encounter (Signed)
Omeprazole sent in as requested and Harold Collins informed. We will see him at his appointment 08/11/2021.

## 2021-08-02 ENCOUNTER — Encounter (HOSPITAL_COMMUNITY): Payer: Self-pay | Admitting: Psychiatry

## 2021-08-02 ENCOUNTER — Other Ambulatory Visit: Payer: Self-pay

## 2021-08-02 ENCOUNTER — Telehealth (HOSPITAL_COMMUNITY): Payer: Commercial Managed Care - PPO | Admitting: Psychiatry

## 2021-08-02 ENCOUNTER — Telehealth (HOSPITAL_BASED_OUTPATIENT_CLINIC_OR_DEPARTMENT_OTHER): Payer: Commercial Managed Care - PPO | Admitting: Psychiatry

## 2021-08-02 VITALS — Wt 220.0 lb

## 2021-08-02 DIAGNOSIS — F4310 Post-traumatic stress disorder, unspecified: Secondary | ICD-10-CM

## 2021-08-02 DIAGNOSIS — F331 Major depressive disorder, recurrent, moderate: Secondary | ICD-10-CM | POA: Diagnosis not present

## 2021-08-02 DIAGNOSIS — Z630 Problems in relationship with spouse or partner: Secondary | ICD-10-CM

## 2021-08-02 DIAGNOSIS — F419 Anxiety disorder, unspecified: Secondary | ICD-10-CM | POA: Diagnosis not present

## 2021-08-02 MED ORDER — LAMOTRIGINE 200 MG PO TABS: 200.0000 mg | ORAL_TABLET | Freq: Every day | ORAL | 1 refills | Status: DC

## 2021-08-02 MED ORDER — FLUOXETINE HCL 10 MG PO CAPS: ORAL_CAPSULE | ORAL | 0 refills | Status: DC

## 2021-08-02 MED ORDER — HYDROXYZINE PAMOATE 25 MG PO CAPS: 25.0000 mg | ORAL_CAPSULE | Freq: Three times a day (TID) | ORAL | 1 refills | Status: DC | PRN

## 2021-08-02 MED ORDER — ARIPIPRAZOLE 5 MG PO TABS: 5.0000 mg | ORAL_TABLET | Freq: Every day | ORAL | 1 refills | Status: DC
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
## 2021-08-11 ENCOUNTER — Ambulatory Visit (INDEPENDENT_AMBULATORY_CARE_PROVIDER_SITE_OTHER): Payer: Commercial Managed Care - PPO | Admitting: Gastroenterology

## 2021-08-11 ENCOUNTER — Encounter: Payer: Self-pay | Admitting: Gastroenterology

## 2021-08-11 VITALS — BP 128/60 | HR 76 | Ht 71.0 in | Wt 222.4 lb

## 2021-08-11 DIAGNOSIS — K449 Diaphragmatic hernia without obstruction or gangrene: Secondary | ICD-10-CM | POA: Diagnosis not present

## 2021-08-11 DIAGNOSIS — K219 Gastro-esophageal reflux disease without esophagitis: Secondary | ICD-10-CM | POA: Diagnosis not present

## 2021-08-11 DIAGNOSIS — Z8719 Personal history of other diseases of the digestive system: Secondary | ICD-10-CM | POA: Diagnosis not present

## 2021-08-11 DIAGNOSIS — Z79899 Other long term (current) drug therapy: Secondary | ICD-10-CM | POA: Diagnosis not present

## 2021-08-11 DIAGNOSIS — R194 Change in bowel habit: Secondary | ICD-10-CM

## 2021-08-11 MED ORDER — OMEPRAZOLE 40 MG PO CPDR: DELAYED_RELEASE_CAPSULE | ORAL | 3 refills | Status: DC

## 2021-08-11 MED ORDER — CITRUCEL PO POWD: 1.0000 | Freq: Every day | ORAL | Status: DC
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
## 2021-08-11 NOTE — Patient Instructions (Addendum)
If you are age 40 or older, your body mass index should be between 23-30. Your Body mass index is 31.02 kg/m. If this is out of the aforementioned range listed, please consider follow up with your Primary Care Provider.  If you are age 71 or younger, your body mass index should be between 19-25. Your Body mass index is 31.02 kg/m. If this is out of the aformentioned range listed, please consider follow up with your Primary Care Provider.   ________________________________________________________  The [place] [facility] providers would like to encourage you to use [facility] to communicate with providers for non-urgent requests or questions.  Due to long hold times on the telephone, sending your provider a message by [facility] may be a faster and more efficient way to get a response.  Please allow 48 business hours for a response.  Please remember that this is for non-urgent requests.  _______________________________________________________  We have sent the following medications to your pharmacy for you to pick up at your convenience: Omeprazole   Please purchase the following medications over the counter and take as directed: Citrucel: Take once daily  You will be due for a recall endoscopy in 11-2021. We will send you a reminder in the mail when it gets closer to that time.  Thank you for entrusting me with your care and for choosing [facility], Dr. Wabeno Cellar

## 2021-09-01 ENCOUNTER — Other Ambulatory Visit: Payer: Self-pay

## 2021-09-01 ENCOUNTER — Encounter (HOSPITAL_COMMUNITY): Payer: Self-pay | Admitting: Psychiatry

## 2021-09-01 ENCOUNTER — Telehealth (HOSPITAL_BASED_OUTPATIENT_CLINIC_OR_DEPARTMENT_OTHER): Payer: Commercial Managed Care - PPO | Admitting: Psychiatry

## 2021-09-01 DIAGNOSIS — F419 Anxiety disorder, unspecified: Secondary | ICD-10-CM

## 2021-09-01 DIAGNOSIS — F331 Major depressive disorder, recurrent, moderate: Secondary | ICD-10-CM

## 2021-09-01 DIAGNOSIS — F4310 Post-traumatic stress disorder, unspecified: Secondary | ICD-10-CM | POA: Diagnosis not present

## 2021-09-01 DIAGNOSIS — Z630 Problems in relationship with spouse or partner: Secondary | ICD-10-CM | POA: Diagnosis not present

## 2021-09-01 MED ORDER — HYDROXYZINE PAMOATE 25 MG PO CAPS: 25.0000 mg | ORAL_CAPSULE | Freq: Three times a day (TID) | ORAL | 2 refills | Status: DC | PRN

## 2021-09-01 MED ORDER — FLUOXETINE HCL 10 MG PO CAPS: ORAL_CAPSULE | ORAL | 2 refills | Status: DC

## 2021-09-01 MED ORDER — LAMOTRIGINE 200 MG PO TABS: 200.0000 mg | ORAL_TABLET | Freq: Every day | ORAL | 2 refills | Status: DC

## 2021-09-01 MED ORDER — ARIPIPRAZOLE 5 MG PO TABS: 5.0000 mg | ORAL_TABLET | Freq: Every day | ORAL | 2 refills | Status: DC

## 2021-09-01 NOTE — Progress Notes (Signed)
Virtual Visit via Telephone Note ? ?I connected with Harold Collins on 09/01/21 at  3:20 PM EST by telephone and verified that I am speaking with the correct person using two identifiers. ? ?Location: ?Patient: Home ?Provider: Office ?  ?I discussed the limitations, risks, security and privacy concerns of performing an evaluation and management service by telephone and the availability of in person appointments. I also discussed with the patient that there may be a patient responsible charge related to this service. The patient expressed understanding and agreed to proceed. ? ? ?History of Present Illness: ?Patient is evaluated by phone session.  On the last visit I started him on Prozac.  He is taking only 10 mg because he noticed improvement in his mood with only 10 mg.  He is not taking mirtazapine.  He lost some weight and he has more energy and more motivation to do things.  He admitted some marital stress but they are working on their marriage.  He has not seen any marriage counselor.  His sleep is improved.  He has no nightmares or flashbacks.  He has no tremor or shakes or any EPS.  He did not like the Abilify which is helping his mood and depression.  Denies any panic attack.  He denies any crying spells or any feeling of hopelessness or worthlessness.  He has accepted promotion but that also has increased his job responsibilities.  He liked his job.  He wants to keep his current medication for now. ? ? ?Past Psychiatric History:  ?H/O depression as a teenager. H/O cut himself and overdose but no inpatient treatment.  H/O severe mood swing, night mares, anger,  fights and depression.  While in [employer] spend 10 days jail due to assault charges. Tried Paxil, BuSpar, Lexapro, Brintellix and Effexor.  Trazodone did not help for sleep.  Seroquel caused increased triglycerides.  Mirtazapine worked for a while.  We did gene testing Prozac and Lamictal are favorable.   ? ?Psychiatric Specialty Exam: ?Physical Exam   ?Review of Systems  ?Weight 217 lb (98.4 kg).There is no height or weight on file to calculate BMI.  ?General Appearance: NA  ?Eye Contact:  NA  ?Speech:  Clear and Coherent and Normal Rate  ?Volume:  Normal  ?Mood:  Euthymic  ?Affect:  NA  ?Thought Process:  Goal Directed  ?Orientation:  Full (Time, Place, and Person)  ?Thought Content:  Logical  ?Suicidal Thoughts:  No  ?Homicidal Thoughts:  No  ?Memory:  Immediate;   Good ?Recent;   Good ?Remote;   Good  ?Judgement:  Good  ?Insight:  Good  ?Psychomotor Activity:  NA  ?Concentration:  Concentration: Good and Attention Span: Good  ?Recall:  Good  ?Fund of Knowledge:  Good  ?Language:  Good  ?Akathisia:  No  ?Handed:  Right  ?AIMS (if indicated):     ?Assets:  Communication Skills ?Desire for Improvement ?Housing ?Transportation  ?ADL's:  Intact  ?Cognition:  WNL  ?Sleep:   better  ? ? ? ? ?Assessment and Plan: ?Major depressive disorder, recurrent.  PTSD.  Marital stress.  Anxiety. ? ?Patient liked the Prozac but he is only taking 10 mg and does not want to go 20 mg.  He feels things are going better and he is sleeping improved.  We will continue Lamictal 200 mg daily, Abilify 5 mg daily, Prozac 10 mg daily and hydroxyzine 25 mg up to 3 times a day.  Recommended to call us back if is any question  or any concern.  Follow-up in 3 months. ? ?Follow Up Instructions: ? ?  ?I discussed the assessment and treatment plan with the patient. The patient was provided an opportunity to ask questions and all were answered. The patient agreed with the plan and demonstrated an understanding of the instructions. ?  ?The patient was advised to call back or seek an in-person evaluation if the symptoms worsen or if the condition fails to improve as anticipated. ? ?I provided 14 minutes of non-face-to-face time during this encounter. ? ? ?Kathlee Nations, MD  ?

## 2021-09-23 ENCOUNTER — Ambulatory Visit
Admission: EM | Admit: 2021-09-23 | Discharge: 2021-09-23 | Disposition: A | Discharge status: Home / Self Care | Payer: Commercial Managed Care - PPO

## 2021-11-18 ENCOUNTER — Encounter: Payer: Self-pay | Admitting: Gastroenterology

## 2021-12-01 ENCOUNTER — Encounter (HOSPITAL_COMMUNITY): Payer: Self-pay | Admitting: Psychiatry

## 2021-12-01 ENCOUNTER — Telehealth (HOSPITAL_BASED_OUTPATIENT_CLINIC_OR_DEPARTMENT_OTHER): Payer: Commercial Managed Care - PPO | Admitting: Psychiatry

## 2021-12-01 VITALS — Wt 217.0 lb

## 2021-12-01 DIAGNOSIS — F419 Anxiety disorder, unspecified: Secondary | ICD-10-CM

## 2021-12-01 DIAGNOSIS — F331 Major depressive disorder, recurrent, moderate: Secondary | ICD-10-CM | POA: Diagnosis not present

## 2021-12-01 DIAGNOSIS — F4310 Post-traumatic stress disorder, unspecified: Secondary | ICD-10-CM

## 2021-12-01 MED ORDER — ARIPIPRAZOLE 5 MG PO TABS: 5.0000 mg | ORAL_TABLET | Freq: Every day | ORAL | 2 refills | Status: DC

## 2021-12-01 MED ORDER — FLUOXETINE HCL 20 MG PO CAPS: ORAL_CAPSULE | ORAL | 2 refills | Status: DC

## 2021-12-01 MED ORDER — LAMOTRIGINE 200 MG PO TABS: 200.0000 mg | ORAL_TABLET | Freq: Every day | ORAL | 2 refills | Status: DC

## 2021-12-01 MED ORDER — HYDROXYZINE PAMOATE 25 MG PO CAPS: 25.0000 mg | ORAL_CAPSULE | Freq: Two times a day (BID) | ORAL | 2 refills | Status: DC

## 2021-12-01 NOTE — Progress Notes (Signed)
Virtual Visit via Telephone Note  I connected with Harold Collins on 12/01/21 at  3:20 PM EDT by telephone and verified that I am speaking with the correct person using two identifiers.  Location: Patient: Home Provider: Home Office   I discussed the limitations, risks, security and privacy concerns of performing an evaluation and management service by telephone and the availability of in person appointments. I also discussed with the patient that there may be a patient responsible charge related to this service. The patient expressed understanding and agreed to proceed.   History of Present Illness: Patient is evaluated by phone session.  He is taking Prozac 10 mg and feeling better.  Occasionally he has nervousness and anxious and poor sleep but feels things are going in the right direction.  His marital distress is also better than before and he has increased communication with his wife.  He does not feel that he need to see a marriage counselor at this time.  He has occasionally nightmares and flashback but his mood is improved from the past.  He has no tremors, shakes or any EPS.  He denies any panic attack.  Denies any feeling of hopelessness or worthlessness.  He is compliant with all his medication.  His job is challenging but manageable.  He is trying to lose weight and start walking every day.  He has no tremor or shakes or any EPS.  Past Psychiatric History:  H/O depression as a teenager. H/O cut himself and overdose but no inpatient treatment.  H/O severe mood swing, night mares, anger,  fights and depression.  While in [employer] spend 10 days jail due to assault charges. Tried Paxil, BuSpar, Lexapro, Brintellix and Effexor.  Trazodone did not help for sleep.  Seroquel caused increased triglycerides.  Mirtazapine worked for a while.  We did gene testing Prozac and Lamictal are favorable.    Psychiatric Specialty Exam: Physical Exam  Review of Systems  Weight 217 lb (98.4 kg).There is no  height or weight on file to calculate BMI.  General Appearance: NA  Eye Contact:  NA  Speech:  Normal Rate  Volume:  Normal  Mood:  Euthymic  Affect:  NA  Thought Process:  Goal Directed  Orientation:  Full (Time, Place, and Person)  Thought Content:  NA  Suicidal Thoughts:  No  Homicidal Thoughts:  No  Memory:  Immediate;   Good Recent;   Good Remote;   Good  Judgement:  Intact  Insight:  Present  Psychomotor Activity:  NA  Concentration:  Concentration: Good and Attention Span: Good  Recall:  Good  Fund of Knowledge:  Good  Language:  Good  Akathisia:  No  Handed:  Right  AIMS (if indicated):     Assets:  Communication Skills Desire for Improvement Housing Social Support Talents/Skills Transportation  ADL's:  Intact  Cognition:  WNL  Sleep:   ok      Assessment and Plan: Major depressive disorder, recurrent.  PTSD.  Anxiety.  Patient doing better since Prozac started.  I recommend to try 20 mg to help his residual symptoms of anxiety, poor sleep and racing thoughts.  He agreed to give a try.  Continue Lamictal 200 mg daily, Abilify 5 mg daily and he will try Prozac 20 mg daily.  He is taking now hydroxyzine 25 mg 2 times a day.  Discussed medication side effects and benefits.  Recommended to call us back with any question or any concern.  Follow-up in 3 months.  Follow Up Instructions:    I discussed the assessment and treatment plan with the patient. The patient was provided an opportunity to ask questions and all were answered. The patient agreed with the plan and demonstrated an understanding of the instructions.   The patient was advised to call back or seek an in-person evaluation if the symptoms worsen or if the condition fails to improve as anticipated.  I provided 10 minutes of non-face-to-face time during this encounter.   Cleotis Nipper, MD

## 2022-01-16 ENCOUNTER — Encounter: Payer: Self-pay | Admitting: Gastroenterology

## 2022-03-02 ENCOUNTER — Other Ambulatory Visit (HOSPITAL_COMMUNITY): Payer: Self-pay | Admitting: Psychiatry

## 2022-03-02 ENCOUNTER — Encounter (HOSPITAL_COMMUNITY): Payer: Self-pay | Admitting: Psychiatry

## 2022-03-02 ENCOUNTER — Telehealth (HOSPITAL_BASED_OUTPATIENT_CLINIC_OR_DEPARTMENT_OTHER): Payer: BC Managed Care – PPO | Admitting: Psychiatry

## 2022-03-02 ENCOUNTER — Telehealth (HOSPITAL_COMMUNITY): Payer: Commercial Managed Care - PPO | Admitting: Psychiatry

## 2022-03-02 DIAGNOSIS — F331 Major depressive disorder, recurrent, moderate: Secondary | ICD-10-CM

## 2022-03-02 DIAGNOSIS — F419 Anxiety disorder, unspecified: Secondary | ICD-10-CM

## 2022-03-02 DIAGNOSIS — F4310 Post-traumatic stress disorder, unspecified: Secondary | ICD-10-CM | POA: Diagnosis not present

## 2022-03-02 MED ORDER — HYDROXYZINE PAMOATE 25 MG PO CAPS: 25.0000 mg | ORAL_CAPSULE | Freq: Two times a day (BID) | ORAL | 2 refills | Status: DC

## 2022-03-02 MED ORDER — LAMOTRIGINE 200 MG PO TABS: 200.0000 mg | ORAL_TABLET | Freq: Every day | ORAL | 2 refills | Status: DC

## 2022-03-02 NOTE — Progress Notes (Signed)
Virtual Visit via Telephone Note  I connected with Harold Collins on 03/02/22 at  3:40 PM EDT by telephone and verified that I am speaking with the correct person using two identifiers.  Location: Patient: Work Provider: Office   I discussed the limitations, risks, security and privacy concerns of performing an evaluation and management service by telephone and the availability of in person appointments. I also discussed with the patient that there may be a patient responsible charge related to this service. The patient expressed understanding and agreed to proceed.   History of Present Illness: Patient is evaluated by phone session.  He stopped taking Abilify and Prozac.  He noticed having restless leg and tremors from the Abilify and since he stopped he is improving.  He ran out from Prozac and felt that he may not need it.  He is taking Lamictal and hydroxyzine.  He is doing much better.  He started job at [employer] and he likes his job.  Patient told it gives him an outlet so he can go out of home.  He still have some time marital issues but manageable.  He does not feel that he need to see a therapist.  He sleeps okay and he still have some time nightmares and flashback.  He like to try melatonin.  I offered to try a different medication but he does not want to add more medication.  So far his mood is stable.  He denies any irritability, anger, mania, suicidal thoughts.  He tried to be more active.  He has no rash or any itching.   Past Psychiatric History:  H/O depression as a teenager. H/O cut himself and overdose but no inpatient treatment.  H/O severe mood swing, night mares, anger,  fights and depression.  While in [employer] spend 10 days jail due to assault charges. Tried Paxil, BuSpar, Lexapro, Brintellix and Effexor.  Trazodone did not help for sleep.  Seroquel caused increased triglycerides.  Mirtazapine worked for a while.  We did gene testing Prozac and Lamictal are favorable.     Psychiatric Specialty Exam: Physical Exam  Review of Systems  Weight 220 lb (99.8 kg).There is no height or weight on file to calculate BMI.  General Appearance: NA  Eye Contact:  NA  Speech:  Clear and Coherent and Normal Rate  Volume:  Normal  Mood:  Euthymic  Affect:  NA  Thought Process:  Goal Directed  Orientation:  Full (Time, Place, and Person)  Thought Content:  Logical  Suicidal Thoughts:  No  Homicidal Thoughts:  No  Memory:  Immediate;   Good Recent;   Good Remote;   Fair  Judgement:  Good  Insight:  Present  Psychomotor Activity:  NA  Concentration:  Concentration: Good and Attention Span: Good  Recall:  Good  Fund of Knowledge:  Good  Language:  Good  Akathisia:  No  Handed:  Right  AIMS (if indicated):     Assets:  Communication Skills Desire for Improvement Housing Talents/Skills Transportation  ADL's:  Intact  Cognition:  WNL  Sleep:   fair, still nightmares      Assessment and Plan: PTSD.  Anxiety.  Major depressive disorder, recurrent.  Patient is not taking Prozac and Abilify.  He noticed side effects from Abilify and he ran out from Prozac and feel much better and does not feel he needed to go back on Prozac.  However he like to continue Lamictal and hydroxyzine which is helping his anxiety, mood and  depression.  I recommend to try melatonin up to 10 mg to help his residual insomnia.  He also agreed if melatonin did not help then he will call us back to try medication.  Discussed medication side effects and benefits.  Recommended to call us back if is any question or any concern.  Follow-up in 3 months.  Follow Up Instructions:    I discussed the assessment and treatment plan with the patient. The patient was provided an opportunity to ask questions and all were answered. The patient agreed with the plan and demonstrated an understanding of the instructions.   The patient was advised to call back or seek an in-person evaluation if the symptoms  worsen or if the condition fails to improve as anticipated.  Collaboration of Care: Primary Care Provider AEB notes are available in epic to review.  Patient/Guardian was advised Release of Information must be obtained prior to any record release in order to collaborate their care with an outside provider. Patient/Guardian was advised if they have not already done so to contact the registration department to sign all necessary forms in order for [place] to release information regarding their care.   Consent: Patient/Guardian gives verbal consent for treatment and assignment of benefits for services provided during this visit. Patient/Guardian expressed understanding and agreed to proceed.    I provided 15 minutes of non-face-to-face time during this encounter.   Cleotis Nipper, MD

## 2022-06-01 ENCOUNTER — Encounter (HOSPITAL_COMMUNITY): Payer: Self-pay | Admitting: Psychiatry

## 2022-06-01 ENCOUNTER — Telehealth (HOSPITAL_BASED_OUTPATIENT_CLINIC_OR_DEPARTMENT_OTHER): Payer: BC Managed Care – PPO | Admitting: Psychiatry

## 2022-06-01 DIAGNOSIS — F419 Anxiety disorder, unspecified: Secondary | ICD-10-CM | POA: Diagnosis not present

## 2022-06-01 DIAGNOSIS — F4310 Post-traumatic stress disorder, unspecified: Secondary | ICD-10-CM | POA: Diagnosis not present

## 2022-06-01 DIAGNOSIS — F331 Major depressive disorder, recurrent, moderate: Secondary | ICD-10-CM

## 2022-06-01 MED ORDER — FLUOXETINE HCL 10 MG PO CAPS: ORAL_CAPSULE | ORAL | 2 refills | Status: DC

## 2022-06-01 MED ORDER — LAMOTRIGINE 200 MG PO TABS: 200.0000 mg | ORAL_TABLET | Freq: Every day | ORAL | 2 refills | Status: DC

## 2022-06-01 MED ORDER — HYDROXYZINE PAMOATE 25 MG PO CAPS: 25.0000 mg | ORAL_CAPSULE | Freq: Two times a day (BID) | ORAL | 2 refills | Status: DC

## 2022-06-01 NOTE — Progress Notes (Signed)
Virtual Visit via Telephone Note  I connected with Harold Collins on 06/01/22 at  2:40 PM EST by telephone and verified that I am speaking with the correct person using two identifiers.  Location: Patient: Work Provider: Office   I discussed the limitations, risks, security and privacy concerns of performing an evaluation and management service by telephone and the availability of in person appointments. I also discussed with the patient that there may be a patient responsible charge related to this service. The patient expressed understanding and agreed to proceed.   History of Present Illness: Patient is a evaluated by phone session.  He noticed lately irritable, anxious, depressed and moody.  He used to take Abilify and Prozac but stopped after he ran out and felt that he does not need it.  His job stressful but so far manageable.  He works for [employer].  He is sleeping okay but still have nightmares and flashback and he noticed lately it is more intensified.  He does not need the melatonin because he can go to sleep okay.  Denies any suicidal thoughts, mania, hallucination.  He is also concerned about weight gain and that is another reason he stopped the Abilify and Prozac however since he stopped these 2 medication his weight has been increasing slowly.  He is now 230 pounds.  He admitted not watching his calorie intake and not going for regular exercise but does walk while he is working.  He reported his family history of thyroid and wondering if he need to go and check his thyroid with his PCP.  He is compliant with Lamictal and reported no tremors, shakes, rash or any itching.  He denies drinking or using any illegal substances.  He lives with his wife.    Past Psychiatric History:  H/O depression as a teenager. H/O cut himself and overdose but no inpatient treatment.  H/O severe mood swing, night mares, anger,  fights and depression.  While in [employer] spend 10 days jail due to  assault charges. Tried Paxil, BuSpar, Lexapro, Brintellix and Effexor.  Trazodone did not help for sleep.  Seroquel caused increased triglycerides.  Mirtazapine worked for a while.  We did gene testing Prozac and Lamictal are favorable.     Psychiatric Specialty Exam: Physical Exam  Review of Systems  Weight 230 lb (104.3 kg).There is no height or weight on file to calculate BMI.  General Appearance: NA  Eye Contact:  NA  Speech:  Normal Rate  Volume:  Normal  Mood:  Anxious and Depressed  Affect:  NA  Thought Process:  Goal Directed  Orientation:  Full (Time, Place, and Person)  Thought Content:  WDL  Suicidal Thoughts:  No  Homicidal Thoughts:  No  Memory:  Immediate;   Good Recent;   Good Remote;   Good  Judgement:  Intact  Insight:  Present  Psychomotor Activity:  NA  Concentration:  Concentration: Fair and Attention Span: Fair  Recall:  Good  Fund of Knowledge:  Good  Language:  Good  Akathisia:  No  Handed:  Right  AIMS (if indicated):     Assets:  Communication Skills Desire for Improvement Housing Talents/Skills Transportation  ADL's:  Intact  Cognition:  WNL  Sleep:   7 hrs, still nightmares      Assessment and Plan: PTSD.  Anxiety.  Major depressive disorder, recurrent.  Patient started to notice more irritable, moody, anger and anxious.  Recommend to restart Prozac 10 mg.  We started him  on 20 but he never go to 20 and only taking 10 mg that helped him a lot but he stopped after he ran out and concerned that he may be gaining weight.  I discussed his weight is actually more since he stopped Abilify and Prozac.  I encouraged to watch his calorie intake and make appointment with PCP to consider blood work since patient reported family history of thyroid issue.  Continue hydroxyzine 25 mg 2 times a day, Lamictal 200 mg daily.  I recommend to call us back if is any question, concern or if he feels worsening of the symptoms.  Follow-up in 3 months.  Follow Up  Instructions:    I discussed the assessment and treatment plan with the patient. The patient was provided an opportunity to ask questions and all were answered. The patient agreed with the plan and demonstrated an understanding of the instructions.   The patient was advised to call back or seek an in-person evaluation if the symptoms worsen or if the condition fails to improve as anticipated.  Collaboration of Care: Other provider involved in patient's care AEB notes are available in epic to review.  Patient/Guardian was advised Release of Information must be obtained prior to any record release in order to collaborate their care with an outside provider. Patient/Guardian was advised if they have not already done so to contact the registration department to sign all necessary forms in order for [place] to release information regarding their care.   Consent: Patient/Guardian gives verbal consent for treatment and assignment of benefits for services provided during this visit. Patient/Guardian expressed understanding and agreed to proceed.    I provided 26 minutes of non-face-to-face time during this encounter.   Cleotis Nipper, MD

## 2022-07-22 ENCOUNTER — Other Ambulatory Visit (HOSPITAL_COMMUNITY): Payer: Self-pay | Admitting: Psychiatry

## 2022-07-22 DIAGNOSIS — F4310 Post-traumatic stress disorder, unspecified: Secondary | ICD-10-CM

## 2022-07-22 DIAGNOSIS — F331 Major depressive disorder, recurrent, moderate: Secondary | ICD-10-CM

## 2022-07-27 ENCOUNTER — Other Ambulatory Visit: Payer: Self-pay | Admitting: Gastroenterology

## 2022-07-28 ENCOUNTER — Other Ambulatory Visit (HOSPITAL_COMMUNITY): Payer: Self-pay | Admitting: Psychiatry

## 2022-08-23 ENCOUNTER — Other Ambulatory Visit (HOSPITAL_COMMUNITY): Payer: Self-pay | Admitting: Psychiatry

## 2022-08-23 DIAGNOSIS — F4310 Post-traumatic stress disorder, unspecified: Secondary | ICD-10-CM

## 2022-08-23 DIAGNOSIS — F331 Major depressive disorder, recurrent, moderate: Secondary | ICD-10-CM

## 2022-08-30 ENCOUNTER — Encounter (HOSPITAL_COMMUNITY): Payer: Self-pay | Admitting: Psychiatry

## 2022-08-30 ENCOUNTER — Telehealth (HOSPITAL_BASED_OUTPATIENT_CLINIC_OR_DEPARTMENT_OTHER): Payer: BC Managed Care – PPO | Admitting: Psychiatry

## 2022-08-30 DIAGNOSIS — F331 Major depressive disorder, recurrent, moderate: Secondary | ICD-10-CM | POA: Diagnosis not present

## 2022-08-30 DIAGNOSIS — F419 Anxiety disorder, unspecified: Secondary | ICD-10-CM | POA: Diagnosis not present

## 2022-08-30 DIAGNOSIS — F4310 Post-traumatic stress disorder, unspecified: Secondary | ICD-10-CM

## 2022-08-30 MED ORDER — FLUOXETINE HCL 20 MG PO CAPS: ORAL_CAPSULE | ORAL | 2 refills | Status: DC

## 2022-08-30 MED ORDER — LAMOTRIGINE 200 MG PO TABS: 200.0000 mg | ORAL_TABLET | Freq: Every day | ORAL | 2 refills | Status: DC

## 2022-08-30 MED ORDER — HYDROXYZINE PAMOATE 25 MG PO CAPS: 25.0000 mg | ORAL_CAPSULE | Freq: Two times a day (BID) | ORAL | 2 refills | Status: DC

## 2022-08-30 NOTE — Progress Notes (Signed)
[facility] MD Virtual Progress Note   Patient Location: Home Provider Location: Home Office  I connect with patient by telephone and verified that I am speaking with correct person by using two identifiers. I discussed the limitations of evaluation and management by telemedicine and the availability of in person appointments. I also discussed with the patient that there may be a patient responsible charge related to this service. The patient expressed understanding and agreed to proceed.  Harold Collins FO:[id number] 42 y.o.  08/30/2022 3:27 PM  History of Present Illness:  Patient is evaluated by phone session.  He noticed since started Prozac 10 mg his mood irritability is somewhat better however still have anxiety, feeling overwhelmed and frustration.  He sleeps fair and occasionally have nightmares and flashback.  His job is challenging but also the manageable.  He works for [employer] shift.  There are some deadline and he gets sometimes stressed about.  He denies any hallucination, suicidal thoughts or homicidal thoughts.  He denies any mania or psychosis.  He is taking Lamictal, Prozac and hydroxyzine.  He did not have any current PCP but he is actively looking for it.  He has no rash or any itching.  Patient lives with his wife and 74 year old son.  His appetite is okay and he reported his weight is unchanged from the past.  Past Psychiatric History: H/O depression as a teenager. H/O cut himself and overdose but no inpatient treatment.  H/O severe mood swing, night mares, anger,  fights and depression.  While in [place] spend 10 days jail due to assault charges. Tried Paxil, BuSpar, Lexapro, Brintellix and Effexor.  Trazodone did not help for sleep.  Seroquel caused increased triglycerides.  Mirtazapine worked for a while.  We did gene testing Prozac and Lamictal are favorable.      Outpatient Encounter Medications as of 08/30/2022  Medication Sig   ARIPiprazole  (ABILIFY) 5 MG tablet Take 1 tablet (5 mg total) by mouth daily. (Patient not taking: Reported on 03/02/2022)   Cholecalciferol 25 MCG (1000 UT) capsule Take 1,000 Units by mouth 2 (two) times daily.   FLUoxetine (PROZAC) 10 MG capsule Take 1 capsule by mouth once daily   hydrOXYzine (VISTARIL) 25 MG capsule Take 1 capsule (25 mg total) by mouth in the morning and at bedtime.   lamoTRIgine (LAMICTAL) 200 MG tablet Take 1 tablet (200 mg total) by mouth daily.   methylcellulose (CITRUCEL) oral powder Take 1 packet by mouth daily.   niacin (NIASPAN) 1000 MG CR tablet Take 1,000 mg by mouth in the morning and at bedtime.   omeprazole (PRILOSEC) 40 MG capsule Take 1 capsule (40 mg total) by mouth daily. Please schedule a yearly follow up for further refills. Thank you   No facility-administered encounter medications on file as of 08/30/2022.    No results found for this or any previous visit (from the past 2160 hour(s)).   Psychiatric Specialty Exam: Physical Exam  Review of Systems  Weight 230 lb (104.3 kg).There is no height or weight on file to calculate BMI.  General Appearance: NA  Eye Contact:  NA  Speech:  Clear and Coherent  Volume:  Normal  Mood:  Anxious  Affect:  NA  Thought Process:  Goal Directed  Orientation:  Full (Time, Place, and Person)  Thought Content:  Rumination  Suicidal Thoughts:  No  Homicidal Thoughts:  No  Memory:  Immediate;   Good Recent;   Good Remote;   Good  Judgement:  Intact  Insight:  Present  Psychomotor Activity:  NA  Concentration:  Concentration: Good and Attention Span: Good  Recall:  Good  Fund of Knowledge:  Good  Language:  Good  Akathisia:  No  Handed:  Right  AIMS (if indicated):     Assets:  Communication Skills Desire for Improvement Housing Talents/Skills Transportation  ADL's:  Intact  Cognition:  WNL  Sleep:  ok     Assessment/Plan: PTSD (post-traumatic stress disorder) - Plan: FLUoxetine (PROZAC) 20 MG capsule,  hydrOXYzine (VISTARIL) 25 MG capsule, lamoTRIgine (LAMICTAL) 200 MG tablet  MDD (major depressive disorder), recurrent episode, moderate (HCC) - Plan: FLUoxetine (PROZAC) 20 MG capsule, hydrOXYzine (VISTARIL) 25 MG capsule  Anxiety - Plan: hydrOXYzine (VISTARIL) 25 MG capsule  Patient doing better since started the Prozac 10 mg.  We recommend to optimize the dose to 20 mg to help his residual mood lability.  Patient agree with the plan.  I also encouraged to schedule appointment with PCP.  He like to see his PCP in [facility] and trying to get appointment.  So far he is tolerating medication and reported no side effects.  Continue Lamictal 200 mg daily, hydroxyzine 25 mg 2 times a day and he will try Prozac 20 mg daily.  Recommend to call us back if is any question or any concern.  Follow-up in 3 months.   Follow Up Instructions:     I discussed the assessment and treatment plan with the patient. The patient was provided an opportunity to ask questions and all were answered. The patient agreed with the plan and demonstrated an understanding of the instructions.   The patient was advised to call back or seek an in-person evaluation if the symptoms worsen or if the condition fails to improve as anticipated.    Collaboration of Care: Other provider involved in patient's care AEB notes are available in epic to review.  Patient/Guardian was advised Release of Information must be obtained prior to any record release in order to collaborate their care with an outside provider. Patient/Guardian was advised if they have not already done so to contact the registration department to sign all necessary forms in order for [place] to release information regarding their care.   Consent: Patient/Guardian gives verbal consent for treatment and assignment of benefits for services provided during this visit. Patient/Guardian expressed understanding and agreed to proceed.     I provided 13 minutes of non face to face  time during this encounter.  Kathlee Nations, MD 08/30/2022

## 2022-10-22 ENCOUNTER — Other Ambulatory Visit: Payer: Self-pay | Admitting: Gastroenterology

## 2022-11-20 ENCOUNTER — Telehealth: Payer: BC Managed Care – PPO | Admitting: Physician Assistant

## 2022-11-20 DIAGNOSIS — A46 Erysipelas: Secondary | ICD-10-CM | POA: Diagnosis not present

## 2022-11-20 MED ORDER — SULFAMETHOXAZOLE-TRIMETHOPRIM 800-160 MG PO TABS: 1.0000 | ORAL_TABLET | Freq: Two times a day (BID) | ORAL | 0 refills | Status: DC
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
## 2022-11-20 NOTE — Patient Instructions (Signed)
[name], thank you for joining Margaretann Loveless, PA-C for today's virtual visit.  While this provider is not your primary care provider (PCP), if your PCP is located in our provider database this encounter information will be shared with them immediately following your visit.   A [facility] MyChart account gives you access to today's visit and all your visits, tests, and labs performed at [facility] " click here if you don't have a [facility] MyChart account or go to [email or web address]  Consent: (Patient) Harold Collins provided verbal consent for this virtual visit at the beginning of the encounter.  Current Medications:  Current Outpatient Medications:    sulfamethoxazole-trimethoprim (BACTRIM DS) 800-160 MG tablet, Take 1 tablet by mouth 2 (two) times daily., Disp: 14 tablet, Rfl: 0   ARIPiprazole (ABILIFY) 5 MG tablet, Take 1 tablet (5 mg total) by mouth daily. (Patient not taking: Reported on 03/02/2022), Disp: 30 tablet, Rfl: 2   Cholecalciferol 25 MCG (1000 UT) capsule, Take 1,000 Units by mouth 2 (two) times daily., Disp: , Rfl:    FLUoxetine (PROZAC) 20 MG capsule, Take 1 capsule by mouth once daily, Disp: 30 capsule, Rfl: 2   hydrOXYzine (VISTARIL) 25 MG capsule, Take 1 capsule (25 mg total) by mouth in the morning and at bedtime., Disp: 60 capsule, Rfl: 2   lamoTRIgine (LAMICTAL) 200 MG tablet, Take 1 tablet (200 mg total) by mouth daily., Disp: 30 tablet, Rfl: 2   methylcellulose (CITRUCEL) oral powder, Take 1 packet by mouth daily., Disp: , Rfl:    niacin (NIASPAN) 1000 MG CR tablet, Take 1,000 mg by mouth in the morning and at bedtime., Disp: , Rfl:    omeprazole (PRILOSEC) 40 MG capsule, Take 1 capsule (40 mg total) by mouth daily. Please schedule a yearly follow up for further refills. Thank you, Disp: 90 capsule, Rfl: 0   Medications ordered in this encounter:  Meds ordered this encounter  Medications   sulfamethoxazole-trimethoprim (BACTRIM DS)  800-160 MG tablet    Sig: Take 1 tablet by mouth 2 (two) times daily.    Dispense:  14 tablet    Refill:  0    Order Specific Question:   Supervising Provider    Answer:   Merrilee Jansky [id number]     *If you need refills on other medications prior to your next appointment, please contact your pharmacy*  Follow-Up: Call back or seek an in-person evaluation if the symptoms worsen or if the condition fails to improve as anticipated.  [facility] [phone]  Other Instructions  Erysipelas  Erysipelas is an infection that affects the skin and tissues near the surface of the skin. It causes the skin to become red, swollen, and painful. The infection is most common on the legs but may also affect other areas, such as the face. With treatment, the infection usually goes away in a few days. If not treated, the infection can spread or lead to other problems, such as collections of pus (abscesses). What are the causes? This condition is caused by bacteria. Most often, it is caused by bacteria called streptococci.  Bacteria may get into the skin through a break in the skin, such as: A cut or scrape. An incision from surgery. A burn. An insect bite. An open sore. A crack in the skin. Sometimes, it is not known how the bacteria infected the skin. What increases the risk? You are more likely to develop this condition if you: Are a young child.  Are an older adult. Have a weakened disease-fighting system (immune system), such as having HIV or AIDS. Have diabetes. Drink a lot of alcohol. Had recent surgery. Have a yeast infection of the skin. Have swollen legs. What are the signs or symptoms? The infection causes a reddened area on the skin. This reddened area may: Be painful and swollen. Have a clear border around it. Feel itchy and hot. Develop blisters or abscesses. Other symptoms may include: Fever. Chills. Nausea and vomiting. Swollen glands (lymph nodes),  such as in the neck. Headache. Feeling tired (fatigue). Loss of appetite. How is this diagnosed? This condition is diagnosed based on: A physical exam. Your health care provider will examine your skin closely. Your symptoms and medical history. How is this treated? This condition is treated with antibiotic medicine. Symptoms usually get better within a few days after starting antibiotics. Follow these instructions at home: Medicines Take other over-the-counter and prescription medicines only as told by your health care provider. Take your antibiotic medicine as told by your health care provider. Do not stop taking the antibiotic even if your condition starts to improve. Ask your health care provider if it is safe for you to take medicines for pain as needed, such as acetaminophen or ibuprofen. General instructions  If the affected area is on an arm or leg, raise (elevate) the affected arm or leg above the level of your heart while you are sitting or lying down. Do not put any creams or lotions on the affected area of your skin unless your health care provider tells you to do that. Do not share bedding, towels, or washcloths (linens) with other people. Use only your own linens to prevent the infection from spreading to others. Follow instructions from your health care provider about how to take care of your wound. Make sure you: Wash your hands with soap and water for at least 20 seconds before and after you change your bandage (dressing). If soap and water are not available, use hand sanitizer. Change your dressing as told by your health care provider. Keep all follow-up visits. This is important. Contact a health care provider if: Your symptoms do not improve within 1-2 days of starting treatment. You develop new symptoms. You have a fever. You feel generally sick (malaise) with muscle aches and pains. Get help right away if: Your symptoms get worse. You develop vomiting or diarrhea  that does not go away. Your red area gets larger or turns dark in color. You notice red streaks coming from the infected area. Summary Erysipelas is an infection affecting the skin and tissues near the surface of the skin. It causes the skin to become red, swollen, and painful. This condition is caused by bacteria. Most often, it is caused by bacteria called streptococci. Bacteria may enter through a break in the skin. Sometimes, it is not known how the bacteria infected the skin. This condition is treated with antibiotic medicine. Symptoms usually get better within a few days after starting antibiotics. This information is not intended to replace advice given to you by your health care provider. Make sure you discuss any questions you have with your health care provider. Document Revised: 11/13/2019 Document Reviewed: 11/12/2019 Elsevier Patient Education  2024 Elsevier Inc.    If you have been instructed to have an in-person evaluation today at a local Urgent Care facility, please use the link below. It will take you to a list of all of our available [facility], including address,  phone number and hours of operation. Please do not delay care.  [facility]  If you or a family member do not have a primary care provider, use the link below to schedule a visit and establish care. When you choose a [facility] primary care physician or advanced practice provider, you gain a long-term partner in health. Find a Primary Care Provider  Learn more about [facility]'s in-office and virtual care options: [facility] - Get Care Now

## 2022-11-22 ENCOUNTER — Telehealth: Payer: Self-pay | Admitting: Gastroenterology

## 2022-11-22 ENCOUNTER — Encounter: Payer: Self-pay | Admitting: Gastroenterology

## 2022-11-22 MED ORDER — OMEPRAZOLE 40 MG PO CPDR: 40.0000 mg | DELAYED_RELEASE_CAPSULE | Freq: Every day | ORAL | 0 refills | Status: DC

## 2022-11-22 NOTE — Telephone Encounter (Signed)
Patient scheduled for EGD in July. Ninety day refill of omeprazole sent to [facility] on [address]

## 2022-11-22 NOTE — Telephone Encounter (Signed)
Patient needing refill on omeprazole medication. Verifying pharmacy is [facility] on [address] in [place]. Pease advise.  Thank you

## 2022-12-07 ENCOUNTER — Encounter (HOSPITAL_COMMUNITY): Payer: Self-pay | Admitting: Psychiatry

## 2022-12-07 ENCOUNTER — Telehealth (HOSPITAL_BASED_OUTPATIENT_CLINIC_OR_DEPARTMENT_OTHER): Payer: BC Managed Care – PPO | Admitting: Psychiatry

## 2022-12-07 VITALS — Wt 240.0 lb

## 2022-12-07 DIAGNOSIS — F4310 Post-traumatic stress disorder, unspecified: Secondary | ICD-10-CM

## 2022-12-07 DIAGNOSIS — F331 Major depressive disorder, recurrent, moderate: Secondary | ICD-10-CM | POA: Diagnosis not present

## 2022-12-07 DIAGNOSIS — F419 Anxiety disorder, unspecified: Secondary | ICD-10-CM

## 2022-12-07 MED ORDER — FLUOXETINE HCL 20 MG PO CAPS: ORAL_CAPSULE | ORAL | 2 refills | Status: DC

## 2022-12-07 MED ORDER — HYDROXYZINE PAMOATE 25 MG PO CAPS: 25.0000 mg | ORAL_CAPSULE | Freq: Two times a day (BID) | ORAL | 2 refills | Status: DC

## 2022-12-07 MED ORDER — LAMOTRIGINE 200 MG PO TABS
200.0000 mg | ORAL_TABLET | Freq: Every day | ORAL | 2 refills | Status: DC
Start: 2022-12-07 — End: 2023-03-09
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
## 2022-12-12 ENCOUNTER — Encounter: Payer: Self-pay | Admitting: Gastroenterology

## 2022-12-12 ENCOUNTER — Ambulatory Visit (AMBULATORY_SURGERY_CENTER): Payer: BC Managed Care – PPO

## 2022-12-12 VITALS — Ht 71.0 in | Wt 246.0 lb

## 2022-12-12 DIAGNOSIS — Z8719 Personal history of other diseases of the digestive system: Secondary | ICD-10-CM

## 2022-12-12 NOTE — Progress Notes (Signed)
Pre visit completed in person;  Patient verified name, DOB, and address; Patient weight at PV appt=246 lbs No egg or soy allergy known to patient; No issues known to pt with past sedation with any surgeries or procedures; Patient denies ever being told they had issues or difficulty with intubation;  No FH of Malignant Hyperthermia; Pt is not on diet pills; Pt is not on home 02;  Pt is not on blood thinners;  Pt denies issues with constipation;  No A fib or A flutter; Have any cardiac testing pending--NO Pt instructed to use [email or web address] or GoodRx for a price reduction on prep;   Insurance verified during PV appt=[employer]BS An[employer]  Patient's chart reviewed by Cathlyn Parsons CNRA prior to previsit and patient appropriate for the [facility].  Previsit completed and red dot placed by patient's name on their procedure day (on provider's schedule).    Instructions printed and given to patient during PV;

## 2022-12-27 ENCOUNTER — Encounter: Payer: Self-pay | Admitting: Gastroenterology

## 2022-12-27 ENCOUNTER — Ambulatory Visit: Payer: BC Managed Care – PPO | Admitting: Gastroenterology

## 2022-12-27 VITALS — BP 102/68 | HR 80 | Temp 99.1°F | Resp 13 | Ht 71.0 in | Wt 246.0 lb

## 2022-12-27 DIAGNOSIS — K219 Gastro-esophageal reflux disease without esophagitis: Secondary | ICD-10-CM | POA: Diagnosis not present

## 2022-12-27 DIAGNOSIS — Z8719 Personal history of other diseases of the digestive system: Secondary | ICD-10-CM

## 2022-12-27 MED ORDER — SODIUM CHLORIDE 0.9 % IV SOLN: 500.0000 mL | INTRAVENOUS | Status: DC

## 2022-12-27 NOTE — Op Note (Signed)
[facility] Patient Name: Harold Collins Procedure Date: 12/27/2022 9:45 AM MRN: [id number] Endoscopist: Viviann Spare P. Adela Lank , MD, [id number] Age: 42 Referring MD:  Date of Birth: [birth year] Gender: Male Account #: [id number] Procedure:                Upper GI endoscopy Indications:              Follow-up of Barrett's esophagus - history of                            Barrett's with dysplasia treated at [facility] - Dr.                            Enrigue Catena in the past, last exam 2021 without any                            recurrence. On omeprazole with good control of                            reflux. Strong family history of esophageal cancer. Medicines:                Monitored Anesthesia Care Procedure:                Pre-Anesthesia Assessment:                           - Prior to the procedure, a History and Physical                            was performed, and patient medications and                            allergies were reviewed. The patient's tolerance of                            previous anesthesia was also reviewed. The risks                            and benefits of the procedure and the sedation                            options and risks were discussed with the patient.                            All questions were answered, and informed consent                            was obtained. Prior Anticoagulants: The patient has                            taken no anticoagulant or antiplatelet agents. ASA                            Grade Assessment: II - A patient with mild systemic  disease. After reviewing the risks and benefits,                            the patient was deemed in satisfactory condition to                            undergo the procedure.                           After obtaining informed consent, the endoscope was                            passed under direct vision. Throughout the                            procedure, the  patient's blood pressure, pulse, and                            oxygen saturations were monitored continuously. The                            GIF [id number] #[id number] was introduced through the                            mouth, and advanced to the second part of duodenum.                            The upper GI endoscopy was accomplished without                            difficulty. The patient tolerated the procedure                            well. Scope In: Scope Out: Findings:                 Esophagogastric landmarks were identified: the                            Z-line was found at 36 cm, the gastroesophageal                            junction was found at 36 cm and the upper extent of                            the gastric folds was found at 40 cm from the                            incisors.                           A 4 cm hiatal hernia was present.                           The Z-line was just slightly irregular  with a few                            mm tongue of salmon colored mucosa without                            nodularity. Biopsies were taken with a cold forceps                            for histology given history as outlined above.                           The exam of the esophagus was otherwise normal.                           The entire examined stomach was normal.                           Nodular mucosa was found in the duodenal bulb                            consistent with benign ectopic gastric mucosa,                            stable from the prior exam.                           The exam of the duodenum was otherwise normal. Complications:            No immediate complications. Estimated blood loss:                            Minimal. Estimated Blood Loss:     Estimated blood loss was minimal. Impression:               - Esophagogastric landmarks identified.                           - 4 cm hiatal hernia.                           - Z-line irregular.  Biopsied.                           - Normal esophagus otherwise.                           - Normal stomach.                           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                           - Normal duodenum otherwise. Recommendation:           -  Patient has a contact number available for                            emergencies. The signs and symptoms of potential                            delayed complications were discussed with the                            patient. Return to normal activities tomorrow.                            Written discharge instructions were provided to the                            patient.                           - Resume previous diet.                           - Continue present medications.                           - Await pathology results. Viviann Spare P. Dessa Ledee, MD 12/27/2022 10:11:20 AM This report has been signed electronically.

## 2022-12-27 NOTE — Progress Notes (Signed)
To pacu, VSS. Report to RN.tb 

## 2022-12-27 NOTE — Progress Notes (Signed)
Pt's states no medical or surgical changes since previsit or office visit. 

## 2022-12-27 NOTE — Progress Notes (Signed)
[facility] History and Physical   Primary Care Physician:  Harold Masker, PA-C (Inactive)   Reason for Procedure:   History of Barrett's  Plan:    EGD     HPI: Harold Collins is a 42 y.o. male  here for EGD to survey history of BArrett's. Has had Barrett's in the past with dysplasia s/p ablation of it per Harold Collins at [facility]. Strong family history of esophageal cancer. On omeprazole. Last EGD was 11/2019. Done in 2017 time frame.    Otherwise feels well without any cardiopulmonary symptoms.   I have discussed risks / benefits of anesthesia and endoscopic procedure with Harold Collins and they wish to proceed with the exams as outlined today.    Past Medical History:  Diagnosis Date   Anxiety    on meds   Barrett's esophagus    Depression    GERD (gastroesophageal reflux disease)    on meds   Major depressive disorder    on meds   PTSD (post-traumatic stress disorder)    on meds   Pulmonary embolism (HCC) 2018   post surgery-IV blood thinners in [facility]-   Seasonal allergies     Past Surgical History:  Procedure Laterality Date   esophageal abrasions  2017   2016 4 times   ESOPHAGOGASTRODUODENOSCOPY  2021   SA-MAC-4 cmm [facility]   MENISCECTOMY Left 2020   MENISCUS REPAIR Left 2018   SHOULDER SURGERY Left 2018   UPPER GASTROINTESTINAL ENDOSCOPY  2017   2016   WRIST SURGERY Left 2016    Prior to Admission medications   Medication Sig Start Date End Date Taking? Authorizing Provider  Cholecalciferol (VITAMIN D3) 50 MCG (2000 UT) capsule Take 1 capsule by mouth daily. 05/08/17  Yes [provider]  FLUoxetine (PROZAC) 20 MG capsule Take 1 capsule by mouth once daily 12/07/22  Yes Collins, Harold Grout, MD  hydrOXYzine (VISTARIL) 25 MG capsule Take 1 capsule (25 mg total) by mouth in the morning and at bedtime. 12/07/22  Yes Collins, Harold Grout, MD  lamoTRIgine (LAMICTAL) 200 MG tablet Take 1 tablet (200 mg total) by mouth daily. 12/07/22  Yes Collins, Harold Grout, MD  niacin  (NIASPAN) 1000 MG CR tablet Take 1,000 mg by mouth in the morning and at bedtime.   Yes [provider]  omeprazole (PRILOSEC) 40 MG capsule Take 1 capsule (40 mg total) by mouth daily. 11/22/22  Yes Harold Collins, Harold Rayas, MD  loratadine (CLARITIN) 10 MG tablet Take 10 mg by mouth daily as needed for allergies. 05/08/17   [provider]    Current Outpatient Medications  Medication Sig Dispense Refill   Cholecalciferol (VITAMIN D3) 50 MCG (2000 UT) capsule Take 1 capsule by mouth daily.     FLUoxetine (PROZAC) 20 MG capsule Take 1 capsule by mouth once daily 30 capsule 2   hydrOXYzine (VISTARIL) 25 MG capsule Take 1 capsule (25 mg total) by mouth in the morning and at bedtime. 60 capsule 2   lamoTRIgine (LAMICTAL) 200 MG tablet Take 1 tablet (200 mg total) by mouth daily. 30 tablet 2   niacin (NIASPAN) 1000 MG CR tablet Take 1,000 mg by mouth in the morning and at bedtime.     omeprazole (PRILOSEC) 40 MG capsule Take 1 capsule (40 mg total) by mouth daily. 90 capsule 0   loratadine (CLARITIN) 10 MG tablet Take 10 mg by mouth daily as needed for allergies.     Current Facility-Administered Medications  Medication Dose Route Frequency Provider  Last Rate Last Admin   0.9 %  sodium chloride infusion  500 mL Intravenous Continuous Harold Collins, Harold Rayas, MD        Allergies as of 12/27/2022 - Review Complete 12/27/2022  Allergen Reaction Noted   Cat hair extract Anaphylaxis and Itching 12/12/2022    Family History  Problem Relation Age of Onset   Depression Mother    Diabetes Mother    Anxiety disorder Mother    Physical abuse Mother    Colon polyps Mother 52   Depression Father    Anxiety disorder Father    Physical abuse Father    Esophageal cancer Father 10   Depression Sister    Anxiety disorder Sister    Physical abuse Sister    Colon polyps Sister 53   Depression Brother    ADD / ADHD Brother    Anxiety disorder Brother    Colon cancer Neg Hx    Rectal  cancer Neg Hx    Stomach cancer Neg Hx     Social History   Socioeconomic History   Marital status: Married    Spouse name: Not on file   Number of children: 2   Years of education: Not on file   Highest education level: High school graduate  Occupational History   Not on file  Tobacco Use   Smoking status: Every Day    Types: Pipe, Cigars   Smokeless tobacco: Never  Vaping Use   Vaping Use: Former   Quit date: 04/26/2017  Substance and Sexual Activity   Alcohol use: Not Currently    Comment: special occasions   Drug use: No   Sexual activity: Yes    Partners: Female    Birth control/protection: None  Other Topics Concern   Not on file  Social History Narrative   Not on file   Social Determinants of Health   Financial Resource Strain: Medium Risk (03/26/2018)   Overall Financial Resource Strain (CARDIA)    Difficulty of Paying Living Expenses: Somewhat hard  Food Insecurity: No Food Insecurity (03/26/2018)   Hunger Vital Sign    Worried About Running Out of Food in the Last Year: Never true    Ran Out of Food in the Last Year: Never true  Transportation Needs: No Transportation Needs (03/26/2018)   PRAPARE - Administrator, Civil Service (Medical): No    Lack of Transportation (Non-Medical): No  Physical Activity: Sufficiently Active (03/26/2018)   Exercise Vital Sign    Days of Exercise per Week: 4 days    Minutes of Exercise per Session: 120 min  Stress: Stress Concern Present (03/26/2018)   [employer] of Occupational Health - Occupational Stress Questionnaire    Feeling of Stress : Very much  Social Connections: Unknown (03/26/2018)   Social Connection and Isolation Panel [NHANES]    Frequency of Communication with Friends and Family: Never    Frequency of Social Gatherings with Friends and Family: Not on file    Attends Religious Services: Never    Database administrator or Organizations: No    Attends Banker Meetings: Never     Marital Status: Married  Catering manager Violence: Not At Risk (03/26/2018)   Humiliation, Afraid, Rape, and Kick questionnaire    Fear of Current or Ex-Partner: No    Emotionally Abused: No    Physically Abused: No    Sexually Abused: No    Review of Systems: All other review of systems negative except as mentioned  in the HPI.  Physical Exam: Vital signs BP 137/77   Pulse 80   Temp 99.1 F (37.3 C) (Temporal)   Ht 5\' 11"  (1.803 m)   Wt 246 lb (111.6 kg)   SpO2 95%   BMI 34.31 kg/m   General:   Alert,  Well-developed, pleasant and cooperative in NAD Lungs:  Clear throughout to auscultation.   Heart:  Regular rate and rhythm Abdomen:  Soft, nontender and nondistended.   Neuro/Psych:  Alert and cooperative. Normal mood and affect. A and O x 3  Harlin Rain, MD [facility]

## 2022-12-27 NOTE — Progress Notes (Signed)
Called to room to assist during endoscopic procedure.  Patient ID and intended procedure confirmed with present staff. Received instructions for my participation in the procedure from the performing physician.  

## 2022-12-27 NOTE — Patient Instructions (Signed)
YOU HAD AN ENDOSCOPIC PROCEDURE TODAY AT THE [facility]:   Refer to the procedure report that was given to you for any specific questions about what was found during the examination.  If the procedure report does not answer your questions, please call your gastroenterologist to clarify.  If you requested that your care partner not be given the details of your procedure findings, then the procedure report has been included in a sealed envelope for you to review at your convenience later.  YOU SHOULD EXPECT: Some feelings of bloating in the abdomen. Passage of more gas than usual.  Walking can help get rid of the air that was put into your GI tract during the procedure and reduce the bloating. If you had a lower endoscopy (such as a colonoscopy or flexible sigmoidoscopy) you may notice spotting of blood in your stool or on the toilet paper. If you underwent a bowel prep for your procedure, you may not have a normal bowel movement for a few days.  Please Note:  You might notice some irritation and congestion in your nose or some drainage.  This is from the oxygen used during your procedure.  There is no need for concern and it should clear up in a day or so.  SYMPTOMS TO REPORT IMMEDIATELY:   Following upper endoscopy (EGD)  Vomiting of blood or coffee ground material  New chest pain or pain under the shoulder blades  Painful or persistently difficult swallowing  New shortness of breath  Fever of 100F or higher  Black, tarry-looking stools  For urgent or emergent issues, a gastroenterologist can be reached at any hour by calling [phone]. Do not use MyChart messaging for urgent concerns.    DIET:  We do recommend a small meal at first, but then you may proceed to your regular diet.  Drink plenty of fluids but you should avoid alcoholic beverages for 24 hours.  ACTIVITY:  You should plan to take it easy for the rest of today and you should NOT DRIVE or use heavy machinery  until tomorrow (because of the sedation medicines used during the test).    FOLLOW UP: Our staff will call the number listed on your records the next business day following your procedure.  We will call around 7:15- 8:00 am to check on you and address any questions or concerns that you may have regarding the information given to you following your procedure. If we do not reach you, we will leave a message.     If any biopsies were taken you will be contacted by phone or by letter within the next 1-3 weeks.  Please call us at [phone] if you have not heard about the biopsies in 3 weeks.    SIGNATURES/CONFIDENTIALITY: You and/or your care partner have signed paperwork which will be entered into your electronic medical record.  These signatures attest to the fact that that the information above on your After Visit Summary has been reviewed and is understood.  Full responsibility of the confidentiality of this discharge information lies with you and/or your care-partner. 

## 2022-12-29 ENCOUNTER — Telehealth: Payer: Self-pay

## 2022-12-29 NOTE — Telephone Encounter (Signed)
  Follow up Call-     12/27/2022    9:05 AM  Call back number  Post procedure Call Back phone  # [phone]  Permission to leave phone message Yes     Patient questions:  Do you have a fever, pain , or abdominal swelling? No. Pain Score  0 *  Have you tolerated food without any problems? Yes.    Have you been able to return to your normal activities? Yes.    Do you have any questions about your discharge instructions: Diet   No. Medications  No. Follow up visit  No.  Do you have questions or concerns about your Care? No.  Actions: * If pain score is 4 or above: No action needed, pain <4.

## 2023-02-16 ENCOUNTER — Other Ambulatory Visit: Payer: Self-pay | Admitting: Gastroenterology

## 2023-03-09 ENCOUNTER — Encounter (HOSPITAL_COMMUNITY): Payer: Self-pay | Admitting: Psychiatry

## 2023-03-09 ENCOUNTER — Telehealth (HOSPITAL_BASED_OUTPATIENT_CLINIC_OR_DEPARTMENT_OTHER): Payer: Self-pay | Admitting: Psychiatry

## 2023-03-09 VITALS — Wt 240.0 lb

## 2023-03-09 DIAGNOSIS — F4310 Post-traumatic stress disorder, unspecified: Secondary | ICD-10-CM

## 2023-03-09 DIAGNOSIS — F331 Major depressive disorder, recurrent, moderate: Secondary | ICD-10-CM

## 2023-03-09 DIAGNOSIS — F419 Anxiety disorder, unspecified: Secondary | ICD-10-CM

## 2023-03-09 MED ORDER — HYDROXYZINE PAMOATE 25 MG PO CAPS
25.0000 mg | ORAL_CAPSULE | Freq: Two times a day (BID) | ORAL | 2 refills | Status: DC
Start: 2023-03-09 — End: 2023-06-08

## 2023-03-09 MED ORDER — LAMOTRIGINE 200 MG PO TABS
200.0000 mg | ORAL_TABLET | Freq: Every day | ORAL | 2 refills | Status: DC
Start: 2023-03-09 — End: 2023-06-08

## 2023-03-09 MED ORDER — FLUOXETINE HCL 20 MG PO CAPS
ORAL_CAPSULE | ORAL | 2 refills | Status: DC
Start: 2023-03-09 — End: 2023-06-08

## 2023-03-09 NOTE — Progress Notes (Signed)
[facility] MD Virtual Progress Note   Patient Location: Work Provider Location: Home Office  I connect with patient by video and verified that I am speaking with correct person by using two identifiers. I discussed the limitations of evaluation and management by telemedicine and the availability of in person appointments. I also discussed with the patient that there may be a patient responsible charge related to this service. The patient expressed understanding and agreed to proceed.  Harold Collins [id number] 42 y.o.  03/09/2023 9:58 AM  History of Present Illness:  Patient is evaluated by video session.  He is at work.  He is working now for [employer] after he quit working for [employer].  He liked his new job which she started in July.  He reported it is manageable and less stressful and job is almost the same.  He feels more active and he had lost few pounds since the last visit.  He is now waiting with the hope that he can cut down his smoking.  So far no major issues.  He denies any crying spells or any feeling of hopelessness or worthlessness.  His sleep better with the hydroxyzine but is still have sometimes nightmares and flashback.  He has no rash, itching, tremors or shakes.  He is happy his son is playing soccer in middle school.  Patient lives with his wife and 19 year old son.  He denies any suicidal thoughts or any feeling of hopelessness.  He would like to keep the current medication which is working for him.  Past Psychiatric History: H/O depression as a teenager. H/O cut himself and overdose but no inpatient treatment.  H/O severe mood swing, night mares, anger,  fights and depression.  While in [employer] spend 10 days jail due to assault charges. Tried Paxil, BuSpar, Lexapro, Brintellix and Effexor.  Trazodone did not help for sleep.  Seroquel caused increased triglycerides.  Mirtazapine worked for a while.  We did gene testing Prozac and Lamictal are favorable.       Outpatient Encounter Medications as of 03/09/2023  Medication Sig   Cholecalciferol (VITAMIN D3) 50 MCG (2000 UT) capsule Take 1 capsule by mouth daily.   FLUoxetine (PROZAC) 20 MG capsule Take 1 capsule by mouth once daily   hydrOXYzine (VISTARIL) 25 MG capsule Take 1 capsule (25 mg total) by mouth in the morning and at bedtime.   lamoTRIgine (LAMICTAL) 200 MG tablet Take 1 tablet (200 mg total) by mouth daily.   loratadine (CLARITIN) 10 MG tablet Take 10 mg by mouth daily as needed for allergies.   niacin (NIASPAN) 1000 MG CR tablet Take 1,000 mg by mouth in the morning and at bedtime.   omeprazole (PRILOSEC) 40 MG capsule Take 1 capsule by mouth once daily   No facility-administered encounter medications on file as of 03/09/2023.    No results found for this or any previous visit (from the past 2160 hour(s)).   Psychiatric Specialty Exam: Physical Exam  Review of Systems  Weight 240 lb (108.9 kg).There is no height or weight on file to calculate BMI.  General Appearance: Casual, wearing work dress  Eye Contact:  Good  Speech:  Clear and Coherent  Volume:  Normal  Mood:  Euthymic  Affect:  Appropriate  Thought Process:  Goal Directed  Orientation:  Full (Time, Place, and Person)  Thought Content:  Logical  Suicidal Thoughts:  No  Homicidal Thoughts:  No  Memory:  Immediate;   Good Recent;   Good Remote;  Good  Judgement:  Good  Insight:  Good  Psychomotor Activity:  Normal  Concentration:  Concentration: Good and Attention Span: Fair  Recall:  Good  Fund of Knowledge:  Good  Language:  Good  Akathisia:  No  Handed:  Right  AIMS (if indicated):     Assets:  Communication Skills Desire for Improvement Housing Social Support Talents/Skills Transportation  ADL's:  Intact  Cognition:  WNL  Sleep:  ok     Assessment/Plan: MDD (major depressive disorder), recurrent episode, moderate (HCC) - Plan: hydrOXYzine (VISTARIL) 25 MG capsule, FLUoxetine (PROZAC) 20 MG  capsule  PTSD (post-traumatic stress disorder) - Plan: hydrOXYzine (VISTARIL) 25 MG capsule, FLUoxetine (PROZAC) 20 MG capsule, lamoTRIgine (LAMICTAL) 200 MG tablet  Anxiety - Plan: hydrOXYzine (VISTARIL) 25 MG capsule  Patient doing better on the medication.  He also changed his job and so far happy with the new company.  He had lost a few pounds since starting the new job.  He started vaping with the hope that he will cut down his smoking which has been helpful.  He has no major concern from the medication.  Continue hydroxyzine 25 mg twice a day as needed for anxiety and sleep.,  Prozac 20 mg daily and lamotrigine 200 mg daily.  Recommended to call us back with any question or any concern.  Follow-up in 3 months   Follow Up Instructions:     I discussed the assessment and treatment plan with the patient. The patient was provided an opportunity to ask questions and all were answered. The patient agreed with the plan and demonstrated an understanding of the instructions.   The patient was advised to call back or seek an in-person evaluation if the symptoms worsen or if the condition fails to improve as anticipated.    Collaboration of Care: Other provider involved in patient's care AEB notes are available in the epic to review  Patient/Guardian was advised Release of Information must be obtained prior to any record release in order to collaborate their care with an outside provider. Patient/Guardian was advised if they have not already done so to contact the registration department to sign all necessary forms in order for [place] to release information regarding their care.   Consent: Patient/Guardian gives verbal consent for treatment and assignment of benefits for services provided during this visit. Patient/Guardian expressed understanding and agreed to proceed.     I provided 20 minutes of non face to face time during this encounter.  Note: This document was prepared by [employer] voice  dictation technology and any errors that results from this process are unintentional.    Cleotis Nipper, MD 03/09/2023

## 2023-04-27 ENCOUNTER — Other Ambulatory Visit: Payer: Self-pay | Admitting: Medical Genetics

## 2023-04-27 DIAGNOSIS — Z006 Encounter for examination for normal comparison and control in clinical research program: Secondary | ICD-10-CM

## 2023-05-07 ENCOUNTER — Other Ambulatory Visit
Admission: RE | Admit: 2023-05-07 | Discharge: 2023-05-07 | Disposition: A | Discharge status: Home / Self Care | Payer: Self-pay | Source: Ambulatory Visit | Attending: Medical Genetics | Admitting: Medical Genetics

## 2023-05-07 DIAGNOSIS — Z006 Encounter for examination for normal comparison and control in clinical research program: Secondary | ICD-10-CM | POA: Insufficient documentation

## 2023-05-15 LAB — GENECONNECT MOLECULAR SCREEN

## 2023-05-15 LAB — HELIX MOLECULAR SCREEN: Genetic Analysis Overall Interpretation: NEGATIVE

## 2023-06-07 ENCOUNTER — Telehealth (HOSPITAL_COMMUNITY): Payer: Self-pay | Admitting: Psychiatry

## 2023-06-08 ENCOUNTER — Telehealth (HOSPITAL_BASED_OUTPATIENT_CLINIC_OR_DEPARTMENT_OTHER): Payer: 59 | Admitting: Psychiatry

## 2023-06-08 ENCOUNTER — Encounter (HOSPITAL_COMMUNITY): Payer: Self-pay | Admitting: Psychiatry

## 2023-06-08 VITALS — Wt 242.0 lb

## 2023-06-08 DIAGNOSIS — F331 Major depressive disorder, recurrent, moderate: Secondary | ICD-10-CM | POA: Diagnosis not present

## 2023-06-08 DIAGNOSIS — F419 Anxiety disorder, unspecified: Secondary | ICD-10-CM

## 2023-06-08 DIAGNOSIS — F4310 Post-traumatic stress disorder, unspecified: Secondary | ICD-10-CM | POA: Diagnosis not present

## 2023-06-08 MED ORDER — HYDROXYZINE PAMOATE 25 MG PO CAPS
25.0000 mg | ORAL_CAPSULE | Freq: Three times a day (TID) | ORAL | 2 refills | Status: DC
Start: 2023-06-08 — End: 2023-07-24

## 2023-06-08 MED ORDER — FLUOXETINE HCL 20 MG PO CAPS
ORAL_CAPSULE | ORAL | 2 refills | Status: DC
Start: 2023-06-08 — End: 2023-09-07

## 2023-06-08 MED ORDER — LAMOTRIGINE 200 MG PO TABS: 200.0000 mg | ORAL_TABLET | Freq: Every day | ORAL | 2 refills | Status: DC

## 2023-06-08 NOTE — Progress Notes (Signed)
[facility] MD Virtual Progress Note   Patient Location: Work Provider Location: Home Office  I connect with patient by video and verified that I am speaking with correct person by using two identifiers. I discussed the limitations of evaluation and management by telemedicine and the availability of in person appointments. I also discussed with the patient that there may be a patient responsible charge related to this service. The patient expressed understanding and agreed to proceed.  Harold Collins [id number] 42 y.o.  06/08/2023 8:24 AM  History of Present Illness:  Patient is evaluated by video session.  He is at work.  He reported lately noticed having flashbacks and dream about his past.  He noticed getting easily irritable, frustrated and paranoid.  He reported job is stressful but manageable.  He reported feeling anxious and paranoid around people.  Sometime he feels people are looking at him or responding to his greeting in a way that he got upset.  He denies any aggression, violence or any homicidal thoughts.  He is sleeping on and off.  He is taking Lamictal, Prozac and hydroxyzine twice a day.  He reported hydroxyzine helps his anxiety.  He has no tremors, rash, itching.  His son is playing indoor soccer.  He is working for [employer] he cut down his smoking and using vape.  Denies any hallucinations.  Patient lives with his wife and 24 year old son.  He denies drinking or using any illegal substances.  His appetite is okay.  He is wondering if he can apply for FMLA so he can take time off when he had episodes of agitation irritability.  Past Psychiatric History: H/O depression as a teenager. H/O cut himself and overdose but no inpatient treatment.  H/O severe mood swing, night mares, anger,  fights and depression.  While in [employer] spend 10 days jail due to assault charges. Tried Paxil, BuSpar, Lexapro, Brintellix and Effexor.  Trazodone did not help for sleep.  Seroquel caused  increased triglycerides.  Mirtazapine worked for a while.  We did gene testing Prozac and Lamictal are favorable.      Outpatient Encounter Medications as of 06/08/2023  Medication Sig   Cholecalciferol (VITAMIN D3) 50 MCG (2000 UT) capsule Take 1 capsule by mouth daily.   FLUoxetine (PROZAC) 20 MG capsule Take 1 capsule by mouth once daily   hydrOXYzine (VISTARIL) 25 MG capsule Take 1 capsule (25 mg total) by mouth in the morning and at bedtime.   lamoTRIgine (LAMICTAL) 200 MG tablet Take 1 tablet (200 mg total) by mouth daily.   loratadine (CLARITIN) 10 MG tablet Take 10 mg by mouth daily as needed for allergies.   niacin (NIASPAN) 1000 MG CR tablet Take 1,000 mg by mouth in the morning and at bedtime.   omeprazole (PRILOSEC) 40 MG capsule Take 1 capsule by mouth once daily   No facility-administered encounter medications on file as of 06/08/2023.    Recent Results (from the past 2160 hours)  [facility] Molecular Screen- Blood ([facility])     Status: None   Collection Time: 05/07/23  3:26 PM  Result Value Ref Range   Genetic Analysis Overall Interpretation Negative    Genetic Disease Assessed      [facility] Tier One Population Screen is a screening test that analyzes 11 genes related to hereditary breast and ovarian cancer (HBOC) syndrome, Lynch syndrome, and familial hypercholesterolemia. This test only reports clinically significant pathogenic and  likely pathogenic variants but does not report variants of uncertain  significance (VUS). In addition, analysis of the PMS2 gene excludes exons 11-15, which overlap with a known pseudogene (PMS2CL).    Genetic Analysis Report      No pathogenic or likely pathogenic variants were detected in the genes analyzed by this test.Genetic test results should be interpreted in the context of an individual's personal medical and family history. Alteration to medical management is NOT  recommended based solely on this result. Clinical correlation  is advised.Additional Considerations- This is a screening test; individuals may still carry pathogenic or likely pathogenic variant(s) in the tested genes that are not detected by this test.-  For individuals at risk for these or other related conditions based on factors including personal or family history, diagnostic testing is recommended.- The absence of pathogenic or likely pathogenic variant(s) in the analyzed genes, while reassuring,  does not eliminate the possibility of a hereditary condition; there are other variants and genes associated with heart disease and hereditary cancer that are not included in this test.    Genes Tested See Notes     Comment: APOB, BRCA1, BRCA2, EPCAM, LDLR, LDLRAP1, PCSK9, PMS2, MLH1, MSH2, MSH6   Disclaimer See Notes     Comment: This test was developed and validated by [facility] This test has not been cleared or approved by the [place] (FDA). The [facility] laboratory is accredited by the College of American Pathologists (CAP) and certified under  the Clinical Laboratory Improvement Amendments (CLIA #: [id number]) to perform high-complexity clinical tests. This test is used for clinical purposes. It should not be regarded as investigational or for research.    Sequencing Location See Notes     Comment: Sequencing done at [facility] [address], [address], [place], [place] [address] (CLIA# [id number])   Interpretation Methods and Limitations See Notes     Comment: Extracted DNA is enriched for targeted regions and then sequenced using the [facility] Exome+ (R) assay on an Illumina DNA sequencing system. Data is then aligned to a modified version of GRCh38 and all genes are analyzed using the MANE transcript and MANE  Plus Clinical transcript, when available. Small variant calling is completed using a customized version of [employer]'s DNAseq software, augmented by a proprietary small variant caller for difficult variants. Copy  number variants (CNVs) are then called  using a proprietary bioinformatics pipeline based on depth analysis with a comparison to similarly sequenced samples. Analysis of the PMS2 gene is limited to exons 1-10. The interpretation and reporting of variants in APOB, PCSK9, and LDLR is specific to  familial hypercholesterolemia; variants associated with hypobetalipoproteinemia are not included. Interpretation is based upon guidelines published by the [employer] and [employer]) and the Association for  Molecular  Pathology (AMP) or their modification by [employer] when available. Interpretation is limited to the transcripts indicated on the report and +/- 10 bp into intronic regions, except as noted below. [facility] variant classifications  include pathogenic, likely pathogenic, variant of uncertain significance (VUS), likely benign, and benign. Only variants classified as pathogenic and likely pathogenic are included in the report. All reported variants are confirmed through secondary  manual inspection of DNA sequence data or orthogonal testing. Risk estimations and management guidelines included in this report are based on analysis of primary literature and recommendations of applicable professional societies, and should be regarded  as approximations.Based on validation studies, this assay delivers > 99% sensitivity and specificity for single nucleotide variants and insertions  and deletions (indels) up to 20 bp. Larger indels and complex variants are a lso reported but sensitivity  may be reduced. Based on validation studies, this assay delivers > 99% sensitivity to multi-exon CNVs and > 90% sensitivity to single-exon CNVs. This test may not detect variants in challenging regions (such as short tandem repeats, homopolymer runs, and  segment duplications), sub-exonic CNVs, chromosomal aneuploidy, or variants in the presence of mosaicism. Phasing will be  attempted and reported, when possible. Structural rearrangements such as inversions, translocations, and gene conversions are not  tested in this assay unless explicitly indicated. Additionally, deep intronic, promoter, and enhancer regions may not be covered. It is important to note that this is a screening test and cannot detect all disease-causing variants. A negative result does  not guarantee the absence of a rare, undetectable variant in the genes analyzed; consider using a diagnostic test if there is significant personal and/or family history of one of the conditions analyze d by this test. Any potential incidental findings  outside of these genes and conditions will not be identified, nor reported. The results of a genetic test may be influenced by various factors, including bone marrow transplantation, blood transfusions, or in rare cases, hematolymphoid neoplasms.Gene  Specific Notes:APOB: analysis is limited to c.10580G>A and c.10579C>T; BRCA1: sequencing analysis extends to CDS +/-20 bp; BRCA2: sequencing analysis extends to CDS +/-20 bp. EPCAM: analysis is limited to CNV of exons 8-9; MLH1: analysis includes CNV of  the promoter; PMS2: analysis is limited to exons 1-10.Gardenia Phlegm, PhD, [email or web address]      Psychiatric Specialty Exam: Physical Exam  Review of Systems  Psychiatric/Behavioral:  Positive for sleep disturbance. The patient is nervous/anxious.     Weight 242 lb (109.8 kg).There is no height or weight on file to calculate BMI.  General Appearance:  wearing work protective cloths  Eye Contact:  Good  Speech:  Clear and Coherent and Normal Rate  Volume:  Normal  Mood:  Anxious and Irritable  Affect:  Congruent  Thought Process:  Goal Directed  Orientation:  Full (Time, Place, and Person)  Thought Content:  Obsessions, Paranoid Ideation, and Rumination  Suicidal Thoughts:  No  Homicidal Thoughts:  No  Memory:  Immediate;   Good Recent;   Good Remote;    Good  Judgement:  Intact  Insight:  Present  Psychomotor Activity:  Decreased  Concentration:  Concentration: Good and Attention Span: Good  Recall:  Good  Fund of Knowledge:  Good  Language:  Good  Akathisia:  No  Handed:  Right  AIMS (if indicated):     Assets:  Communication Skills Desire for Improvement Housing Social Support Talents/Skills Transportation  ADL's:  Intact  Cognition:  WNL  Sleep:  fair     Assessment/Plan: MDD (major depressive disorder), recurrent episode, moderate (HCC) - Plan: hydrOXYzine (VISTARIL) 25 MG capsule, FLUoxetine (PROZAC) 20 MG capsule  PTSD (post-traumatic stress disorder) - Plan: hydrOXYzine (VISTARIL) 25 MG capsule, lamoTRIgine (LAMICTAL) 200 MG tablet, FLUoxetine (PROZAC) 20 MG capsule  Anxiety - Plan: hydrOXYzine (VISTARIL) 25 MG capsule  Discussed increased irritability, frustration and paranoia.  He is not sure what triggered but could be the holiday time, increase stress from work.  Discussed to walk away from stressful situation and use relaxing technique.  I recommend to try hydroxyzine increased from twice a day to 3 times a day.  If symptoms do not improve we will consider optimizing his other psychotropic medication.  For now continue Prozac 20 mg daily  and Lamictal 200 mg daily.  He has no rash or any itching.  I suggested to contact his human resources to inquire about FMLA papers and if he qualified them he can send [place] paperwork to be completed.  I recommend we can provide up to 16 hours FMLA if his company allows him.  Patient agreed and he will contact his human resources.  I also encouraged to call us back if symptoms started to get worse or any time having active suicidal thoughts or homicidal thoughts.  Patient like to have appointment in 3 months.  Follow Up Instructions:     I discussed the assessment and treatment plan with the patient. The patient was provided an opportunity to ask questions and all were answered. The  patient agreed with the plan and demonstrated an understanding of the instructions.   The patient was advised to call back or seek an in-person evaluation if the symptoms worsen or if the condition fails to improve as anticipated.    Collaboration of Care: Other provider involved in patient's care AEB notes are available in epic to review  Patient/Guardian was advised Release of Information must be obtained prior to any record release in order to collaborate their care with an outside provider. Patient/Guardian was advised if they have not already done so to contact the registration department to sign all necessary forms in order for [place] to release information regarding their care.   Consent: Patient/Guardian gives verbal consent for treatment and assignment of benefits for services provided during this visit. Patient/Guardian expressed understanding and agreed to proceed.     I provided 22 minutes of non face to face time during this encounter.  Note: This document was prepared by [employer] voice dictation technology and any errors that results from this process are unintentional.    Cleotis Nipper, MD 06/08/2023

## 2023-07-09 ENCOUNTER — Encounter (INDEPENDENT_AMBULATORY_CARE_PROVIDER_SITE_OTHER): Payer: Self-pay

## 2023-07-24 ENCOUNTER — Other Ambulatory Visit (HOSPITAL_COMMUNITY): Payer: Self-pay | Admitting: Psychiatry

## 2023-07-24 DIAGNOSIS — F419 Anxiety disorder, unspecified: Secondary | ICD-10-CM

## 2023-07-24 DIAGNOSIS — F4310 Post-traumatic stress disorder, unspecified: Secondary | ICD-10-CM

## 2023-07-24 DIAGNOSIS — F331 Major depressive disorder, recurrent, moderate: Secondary | ICD-10-CM

## 2023-08-08 ENCOUNTER — Ambulatory Visit (INDEPENDENT_AMBULATORY_CARE_PROVIDER_SITE_OTHER): Payer: Self-pay | Admitting: Family Medicine

## 2023-08-08 ENCOUNTER — Encounter: Payer: Self-pay | Admitting: Family Medicine

## 2023-08-08 VITALS — BP 119/68 | HR 81 | Ht 71.0 in | Wt 248.8 lb

## 2023-08-08 DIAGNOSIS — M79672 Pain in left foot: Secondary | ICD-10-CM

## 2023-08-08 DIAGNOSIS — E559 Vitamin D deficiency, unspecified: Secondary | ICD-10-CM

## 2023-08-08 DIAGNOSIS — Z23 Encounter for immunization: Secondary | ICD-10-CM

## 2023-08-08 DIAGNOSIS — N183 Chronic kidney disease, stage 3 unspecified: Secondary | ICD-10-CM | POA: Diagnosis not present

## 2023-08-08 DIAGNOSIS — E0822 Diabetes mellitus due to underlying condition with diabetic chronic kidney disease: Secondary | ICD-10-CM | POA: Diagnosis not present

## 2023-08-08 DIAGNOSIS — E1169 Type 2 diabetes mellitus with other specified complication: Secondary | ICD-10-CM

## 2023-08-08 DIAGNOSIS — Z1159 Encounter for screening for other viral diseases: Secondary | ICD-10-CM

## 2023-08-08 DIAGNOSIS — E782 Mixed hyperlipidemia: Secondary | ICD-10-CM

## 2023-08-08 LAB — POCT GLYCOSYLATED HEMOGLOBIN (HGB A1C): Hemoglobin A1C: 7.2 % — AB (ref 4.0–5.6)

## 2023-08-08 NOTE — Assessment & Plan Note (Signed)
A1c today 7.2.  Encouraged to continue going to the gym and staying physically active, monitor carb intake and to limit intake of foods that are high in carbs/sugar.  Recheck A1c with annual labs in 4 months to assess progress.

## 2023-08-08 NOTE — Patient Instructions (Addendum)
It would be fantastic if we can avoid additional medication for you.  For the next few months, focus on limiting foods that are high in carbs/sugar.  Aim to limit the carbohydrate content of each meal to about 30-40 g.  Continue going to the gym and staying active, hopefully that will be easier to do once we figure out what is causing the pain in your foot!  Xray has been ordered to  [facility] Scheduling: [phone] General phone: [phone] [address] [place], [place] [address]

## 2023-08-08 NOTE — Progress Notes (Signed)
Acute Office Visit  Subjective:     Patient ID: Harold Collins, male    DOB: 01-Jul-1980, 43 y.o.   MRN: [id number]  Chief Complaint  Patient presents with   Foot Pain    HPI Patient is in today for left foot pain.  He notes that pain initially started about 1 year ago, tends to be around a 2/10.  The pain improves some with rest and worsens after being on his feet for long.  As of time.  He has not been evaluated for this issue previously.  He does follow-up routinely with Dr. Lolly Mustache with behavioral health, but he has not seen a primary care provider for about 2-1/2 years.  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  He also notes that he has started going back to the gym within the past few weeks and has simply been modifying exercises to prevent foot pain.  ROS See HPI    Objective:    BP 119/68   Pulse 81   Ht 5\' 11"  (1.803 m)   Wt 248 lb 12 oz (112.8 kg)   SpO2 98%   BMI 34.69 kg/m   Physical Exam Constitutional:      General: He is not in acute distress.    Appearance: Normal appearance.  HENT:     Head: Normocephalic and atraumatic.  Cardiovascular:     Rate and Rhythm: Normal rate and regular rhythm.     Heart sounds: Normal heart sounds. No murmur heard.    No friction rub. No gallop.  Pulmonary:     Effort: Pulmonary effort is normal. No respiratory distress.     Breath sounds: Normal breath sounds. No wheezing, rhonchi or rales.  Musculoskeletal:     Right ankle: Normal.     Left ankle: Normal.     Left foot: Normal range of motion. Tenderness (TTP over 5th metatarsal) present. No swelling, deformity or bunion.  Skin:    General: Skin is warm and dry.  Neurological:     Mental Status: He is alert and oriented to person, place, and time.  Psychiatric:        Mood and Affect: Mood normal.    Results for orders placed or performed in visit on 08/08/23  POCT HgB  A1C  Result Value Ref Range   Hemoglobin A1C 7.2 (A) 4.0 - 5.6 %   HbA1c POC (<> result, manual entry)     HbA1c, POC (prediabetic range)     HbA1c, POC (controlled diabetic range)        Assessment & Plan:  Left foot pain -     DG Foot Complete Left; Future  Vitamin D deficiency -     VITAMIN D 25 Hydroxy (Vit-D Deficiency, Fractures); Future  Mixed diabetic hyperlipidemia associated with type 2 diabetes mellitus (HCC) -     CBC with Differential/Platelet; Future -     Comprehensive metabolic panel; Future -     Lipid panel; Future  Diabetes mellitus due to underlying condition with stage 3 chronic kidney disease, without long-term current use of insulin, unspecified whether stage 3a or 3b CKD (HCC) Assessment & Plan: A1c today 7.2.  Encouraged to continue going to the gym and staying physically active, monitor carb intake and to limit intake of foods that are high in carbs/sugar.  Recheck A1c with annual labs in 4 months to assess progress.  Orders: -     CBC with Differential/Platelet; Future -     Comprehensive metabolic panel; Future -     TSH Rfx on Abnormal to Free T4; Future -     POCT glycosylated hemoglobin (Hb A1C)  Screening for viral disease -     Hepatitis C antibody; Future  Encounter for immunization -     Flu vaccine trivalent PF, 6mos and older(Flulaval,Afluria,Fluarix,Fluzone)  Starting workup with x-ray of left foot.  Also collecting labs as they have not been checked for almost 3 years.  Patient agreeable to influenza vaccine while in the office today.  Return in about 4 months (around 12/06/2023) for annual physical, fasting labs 1 week before.  Melida Quitter, PA

## 2023-08-09 ENCOUNTER — Encounter: Payer: Self-pay | Admitting: Family Medicine

## 2023-08-09 ENCOUNTER — Other Ambulatory Visit: Payer: Self-pay | Admitting: Family Medicine

## 2023-08-09 DIAGNOSIS — E781 Pure hyperglyceridemia: Secondary | ICD-10-CM

## 2023-08-09 LAB — CBC WITH DIFFERENTIAL/PLATELET
Basophils Absolute: 0.1 10*3/uL (ref 0.0–0.2)
Basos: 1 %
EOS (ABSOLUTE): 0.3 10*3/uL (ref 0.0–0.4)
Eos: 6 %
Hematocrit: 43.8 % (ref 37.5–51.0)
Hemoglobin: 15.1 g/dL (ref 13.0–17.7)
Immature Grans (Abs): 0 10*3/uL (ref 0.0–0.1)
Immature Granulocytes: 0 %
Lymphocytes Absolute: 1.3 10*3/uL (ref 0.7–3.1)
Lymphs: 30 %
MCH: 30.9 pg (ref 26.6–33.0)
MCHC: 34.5 g/dL (ref 31.5–35.7)
MCV: 90 fL (ref 79–97)
Monocytes Absolute: 0.4 10*3/uL (ref 0.1–0.9)
Monocytes: 9 %
Neutrophils Absolute: 2.3 10*3/uL (ref 1.4–7.0)
Neutrophils: 54 %
Platelets: 355 10*3/uL (ref 150–450)
RBC: 4.88 x10E6/uL (ref 4.14–5.80)
RDW: 13.1 % (ref 11.6–15.4)
WBC: 4.4 10*3/uL (ref 3.4–10.8)

## 2023-08-09 LAB — COMPREHENSIVE METABOLIC PANEL
ALT: 55 [IU]/L — ABNORMAL HIGH (ref 0–44)
AST: 30 [IU]/L (ref 0–40)
Albumin: 4.8 g/dL (ref 4.1–5.1)
Alkaline Phosphatase: 85 [IU]/L (ref 44–121)
BUN/Creatinine Ratio: 11 (ref 9–20)
BUN: 13 mg/dL (ref 6–24)
Bilirubin Total: 0.6 mg/dL (ref 0.0–1.2)
CO2: 22 mmol/L (ref 20–29)
Calcium: 9.8 mg/dL (ref 8.7–10.2)
Chloride: 99 mmol/L (ref 96–106)
Creatinine, Ser: 1.18 mg/dL (ref 0.76–1.27)
Globulin, Total: 2.2 g/dL (ref 1.5–4.5)
Glucose: 176 mg/dL — ABNORMAL HIGH (ref 70–99)
Potassium: 4.5 mmol/L (ref 3.5–5.2)
Sodium: 137 mmol/L (ref 134–144)
Total Protein: 7 g/dL (ref 6.0–8.5)
eGFR: 80 mL/min/{1.73_m2} (ref >59)

## 2023-08-09 LAB — LIPID PANEL
Chol/HDL Ratio: 6.8 {ratio} — ABNORMAL HIGH (ref 0.0–5.0)
Cholesterol, Total: 217 mg/dL — ABNORMAL HIGH (ref 100–199)
HDL: 32 mg/dL — ABNORMAL LOW (ref >39)
LDL Chol Calc (NIH): 72 mg/dL (ref 0–99)
Triglycerides: 723 mg/dL (ref 0–149)
VLDL Cholesterol Cal: 113 mg/dL — ABNORMAL HIGH (ref 5–40)

## 2023-08-09 LAB — HEPATITIS C ANTIBODY: Hep C Virus Ab: NONREACTIVE

## 2023-08-09 LAB — TSH RFX ON ABNORMAL TO FREE T4: TSH: 1.51 u[IU]/mL (ref 0.450–4.500)

## 2023-08-09 LAB — VITAMIN D 25 HYDROXY (VIT D DEFICIENCY, FRACTURES): Vit D, 25-Hydroxy: 61.1 ng/mL (ref 30.0–100.0)

## 2023-08-09 MED ORDER — NIACIN ER (ANTIHYPERLIPIDEMIC) 1000 MG PO TBCR: 1000.0000 mg | EXTENDED_RELEASE_TABLET | Freq: Two times a day (BID) | ORAL | 1 refills | Status: AC

## 2023-09-07 ENCOUNTER — Encounter (HOSPITAL_COMMUNITY): Payer: Self-pay | Admitting: Psychiatry

## 2023-09-07 ENCOUNTER — Telehealth (HOSPITAL_BASED_OUTPATIENT_CLINIC_OR_DEPARTMENT_OTHER): Payer: 59 | Admitting: Psychiatry

## 2023-09-07 VITALS — Wt 248.0 lb

## 2023-09-07 DIAGNOSIS — F419 Anxiety disorder, unspecified: Secondary | ICD-10-CM

## 2023-09-07 DIAGNOSIS — F4310 Post-traumatic stress disorder, unspecified: Secondary | ICD-10-CM | POA: Diagnosis not present

## 2023-09-07 DIAGNOSIS — F331 Major depressive disorder, recurrent, moderate: Secondary | ICD-10-CM | POA: Diagnosis not present

## 2023-09-07 MED ORDER — HYDROXYZINE PAMOATE 25 MG PO CAPS: 25.0000 mg | ORAL_CAPSULE | Freq: Three times a day (TID) | ORAL | 1 refills | Status: DC

## 2023-09-07 MED ORDER — LAMOTRIGINE 200 MG PO TABS: 200.0000 mg | ORAL_TABLET | Freq: Every day | ORAL | 2 refills | Status: DC

## 2023-09-07 MED ORDER — FLUOXETINE HCL 20 MG PO CAPS: ORAL_CAPSULE | ORAL | 2 refills | Status: DC

## 2023-09-07 NOTE — Progress Notes (Addendum)
 [facility] MD Virtual Progress Note   Patient Location: Work Provider Location: Home Office  I connect with patient by video and verified that I am speaking with correct person by using two identifiers. I discussed the limitations of evaluation and management by telemedicine and the availability of in person appointments. I also discussed with the patient that there may be a patient responsible charge related to this service. The patient expressed understanding and agreed to proceed.  Harold Collins [id number] 43 y.o.  09/07/2023 8:24 AM  History of Present Illness:  Patient is evaluated by video session.  He reported job is stressful and challenging.  Company started [employer] and he hit to see people needing the job.  He believes his job is so far safe but he is not sure about the future.  He is taking some days hydroxyzine 3 times a day which helps his anxiety.  He continues to have dreams at night but they are not as intense.  Patient works for [employer].  He lives with his wife and 31 year old son.  He reported had a visit with primary care for his foot pain.  He also had a blood work.  He is upset because he did not received any feedback for his x-rays which was ordered.  He notices lab work on his MyChart and found to have high blood sugar, increase in triglycerides and mild elevation of 1 of liver enzymes.  Patient was hoping to have some recommendations from his primary care but so far he has not heard from his PCP.  His irritability somewhat better since he takes some time third hydroxyzine.  He is compliant with Lamictal and Prozac.  Denies any feeling of hopelessness, worthlessness, suicidal thoughts, paranoia or any hallucination.  Since he saw his labs he started workout every day and trying to watch his calorie intake.  He denies any panic attack.  Past Psychiatric History: H/O depression as a teenager. H/O cut himself and overdose but no inpatient  treatment.  H/O severe mood swing, night mares, anger,  fights and depression.  While in [employer] spend 10 days jail due to assault charges. Tried Paxil, BuSpar, Lexapro, Brintellix and Effexor.  Trazodone did not help for sleep.  Seroquel caused increased triglycerides.  Mirtazapine worked for a while.  We did gene testing Prozac and Lamictal are favorable.        Outpatient Encounter Medications as of 09/07/2023  Medication Sig   Cholecalciferol (VITAMIN D3) 50 MCG (2000 UT) capsule Take 1 capsule by mouth daily.   FLUoxetine (PROZAC) 20 MG capsule Take 1 capsule by mouth once daily   hydrOXYzine (VISTARIL) 25 MG capsule TAKE 1 CAPSULE BY MOUTH IN THE MORNING AND AT BEDTIME   lamoTRIgine (LAMICTAL) 200 MG tablet Take 1 tablet (200 mg total) by mouth daily.   niacin (NIASPAN) 1000 MG CR tablet Take 1 tablet (1,000 mg total) by mouth in the morning and at bedtime.   omeprazole (PRILOSEC) 40 MG capsule Take 1 capsule by mouth once daily   No facility-administered encounter medications on file as of 09/07/2023.    Recent Results (from the past 2160 hours)  POCT HgB A1C     Status: Abnormal   Collection Time: 08/08/23  8:43 AM  Result Value Ref Range   Hemoglobin A1C 7.2 (A) 4.0 - 5.6 %   HbA1c POC (<> result, manual entry)     HbA1c, POC (prediabetic range)     HbA1c, POC (controlled diabetic range)  Hepatitis C antibody     Status: None   Collection Time: 08/08/23  9:01 AM  Result Value Ref Range   Hep C Virus Ab Non Reactive Non Reactive    Comment: HCV antibody alone does not differentiate between previously resolved infection and active infection. Equivocal and Reactive HCV antibody results should be followed up with an HCV RNA test to support the diagnosis of active HCV infection.   VITAMIN D 25 Hydroxy (Vit-D Deficiency, Fractures)     Status: None   Collection Time: 08/08/23  9:01 AM  Result Value Ref Range   Vit D, 25-Hydroxy 61.1 30.0 - 100.0 ng/mL    Comment: Vitamin D  deficiency has been defined by the Institute of Medicine and an Endocrine Society practice guideline as a level of serum 25-OH vitamin D less than 20 ng/mL (1,2). The Endocrine Society went on to further define vitamin D insufficiency as a level between 21 and 29 ng/mL (2). 1. IOM (Institute of Medicine). 2010. Dietary reference    intakes for calcium and D. [place]: The    [employer]. 2. Holick MF, Binkley [place], Bischoff-Ferrari HA, et al.    Evaluation, treatment, and prevention of vitamin D    deficiency: an Endocrine Society clinical practice    guideline. JCEM. 2011 Jul; 96(7):1911-30.   TSH Rfx on Abnormal to Free T4     Status: None   Collection Time: 08/08/23  9:01 AM  Result Value Ref Range   TSH 1.510 0.450 - 4.500 uIU/mL  Lipid panel     Status: Abnormal   Collection Time: 08/08/23  9:01 AM  Result Value Ref Range   Cholesterol, Total 217 (H) 100 - 199 mg/dL   Triglycerides 409 (HH) 0 - 149 mg/dL   HDL 32 (L) >81 mg/dL   VLDL Cholesterol Cal 113 (H) 5 - 40 mg/dL   LDL Chol Calc (NIH) 72 0 - 99 mg/dL   Chol/HDL Ratio 6.8 (H) 0.0 - 5.0 ratio    Comment:                                   T. Chol/HDL Ratio                                             Men  Women                               1/2 Avg.Risk  3.4    3.3                                   Avg.Risk  5.0    4.4                                2X Avg.Risk  9.6    7.1                                3X Avg.Risk 23.4   11.0   Comprehensive metabolic panel     Status: Abnormal   Collection Time:  08/08/23  9:01 AM  Result Value Ref Range   Glucose 176 (H) 70 - 99 mg/dL   BUN 13 6 - 24 mg/dL   Creatinine, Ser 0.27 0.76 - 1.27 mg/dL   eGFR 80 >25 DG/UYQ/0.34   BUN/Creatinine Ratio 11 9 - 20   Sodium 137 134 - 144 mmol/L   Potassium 4.5 3.5 - 5.2 mmol/L   Chloride 99 96 - 106 mmol/L   CO2 22 20 - 29 mmol/L   Calcium 9.8 8.7 - 10.2 mg/dL   Total Protein 7.0 6.0 - 8.5 g/dL   Albumin 4.8 4.1 - 5.1  g/dL   Globulin, Total 2.2 1.5 - 4.5 g/dL   Bilirubin Total 0.6 0.0 - 1.2 mg/dL   Alkaline Phosphatase 85 44 - 121 IU/L   AST 30 0 - 40 IU/L   ALT 55 (H) 0 - 44 IU/L  CBC with Differential/Platelet     Status: None   Collection Time: 08/08/23  9:01 AM  Result Value Ref Range   WBC 4.4 3.4 - 10.8 x10E3/uL   RBC 4.88 4.14 - 5.80 x10E6/uL   Hemoglobin 15.1 13.0 - 17.7 g/dL   Hematocrit 74.2 59.5 - 51.0 %   MCV 90 79 - 97 fL   MCH 30.9 26.6 - 33.0 pg   MCHC 34.5 31.5 - 35.7 g/dL   RDW 63.8 75.6 - 43.3 %   Platelets 355 150 - 450 x10E3/uL   Neutrophils 54 Not Estab. %   Lymphs 30 Not Estab. %   Monocytes 9 Not Estab. %   Eos 6 Not Estab. %   Basos 1 Not Estab. %   Neutrophils Absolute 2.3 1.4 - 7.0 x10E3/uL   Lymphocytes Absolute 1.3 0.7 - 3.1 x10E3/uL   Monocytes Absolute 0.4 0.1 - 0.9 x10E3/uL   EOS (ABSOLUTE) 0.3 0.0 - 0.4 x10E3/uL   Basophils Absolute 0.1 0.0 - 0.2 x10E3/uL   Immature Granulocytes 0 Not Estab. %   Immature Grans (Abs) 0.0 0.0 - 0.1 x10E3/uL     Psychiatric Specialty Exam: Physical Exam  Review of Systems  Musculoskeletal:        Foot pain    Weight 248 lb (112.5 kg).There is no height or weight on file to calculate BMI.  General Appearance: Casual  Eye Contact:  Good  Speech:  Clear and Coherent and Normal Rate  Volume:  Normal  Mood:  Anxious  Affect:  Appropriate  Thought Process:  Goal Directed  Orientation:  Full (Time, Place, and Person)  Thought Content:  Rumination  Suicidal Thoughts:  No  Homicidal Thoughts:  No  Memory:  Immediate;   Good Recent;   Good Remote;   Good  Judgement:  Intact  Insight:  Present  Psychomotor Activity:  Normal  Concentration:  Concentration: Good and Attention Span: Good  Recall:  Good  Fund of Knowledge:  Good  Language:  Good  Akathisia:  No  Handed:  Right  AIMS (if indicated):     Assets:  Communication Skills Desire for Improvement Financial  Resources/Insurance Housing Resilience Talents/Skills Transportation  ADL's:  Intact  Cognition:  WNL  Sleep:  better but still dreams     Assessment/Plan: MDD (major depressive disorder), recurrent episode, moderate (HCC) - Plan: FLUoxetine (PROZAC) 20 MG capsule, hydrOXYzine (VISTARIL) 25 MG capsule  PTSD (post-traumatic stress disorder) - Plan: FLUoxetine (PROZAC) 20 MG capsule, hydrOXYzine (VISTARIL) 25 MG capsule, lamoTRIgine (LAMICTAL) 200 MG tablet  Anxiety - Plan: hydrOXYzine (VISTARIL) 25 MG capsule  Reviewed blood  work results.  Patient like to have sent a message to his primary care for addressing his abnormal labs and follow-up on his x-ray of the foot.  We will send a message to his PCP.  His hemoglobin A1c is 7.2 and triglyceride in 700s.  Patient told had a family history of high triglycerides.  Currently he is taking niacin prescribed by PCP.  Patient reported current medicine is helping his mood irritability depression anxiety and chronic PTSD.  Continue Lamictal 200 mg daily, Prozac 20 mg daily and hydroxyzine 25 mg up to 3 times a day.  He has no rash, itching tremors or shakes.  Contacting PCP for further workup for his health issues.  Recommended to call us back if is any question or any concern.  Follow-up in 3 months.   Follow Up Instructions:     I discussed the assessment and treatment plan with the patient. The patient was provided an opportunity to ask questions and all were answered. The patient agreed with the plan and demonstrated an understanding of the instructions.   The patient was advised to call back or seek an in-person evaluation if the symptoms worsen or if the condition fails to improve as anticipated.    Collaboration of Care: Other provider involved in patient's care AEB notes are available in epic to review.  I will also send message to his PCP  Patient/Guardian was advised Release of Information must be obtained prior to any record release in  order to collaborate their care with an outside provider. Patient/Guardian was advised if they have not already done so to contact the registration department to sign all necessary forms in order for [place] to release information regarding their care.   Consent: Patient/Guardian gives verbal consent for treatment and assignment of benefits for services provided during this visit. Patient/Guardian expressed understanding and agreed to proceed.     I provided 23 minutes of non face to face time during this encounter.  Note: This document was prepared by [employer] voice dictation technology and any errors that results from this process are unintentional.    Cleotis Nipper, MD 09/07/2023

## 2023-10-23 ENCOUNTER — Other Ambulatory Visit (HOSPITAL_COMMUNITY): Payer: Self-pay | Admitting: Psychiatry

## 2023-10-23 DIAGNOSIS — F331 Major depressive disorder, recurrent, moderate: Secondary | ICD-10-CM

## 2023-10-23 DIAGNOSIS — F4310 Post-traumatic stress disorder, unspecified: Secondary | ICD-10-CM

## 2023-10-27 ENCOUNTER — Other Ambulatory Visit (HOSPITAL_COMMUNITY): Payer: Self-pay | Admitting: Psychiatry

## 2023-10-27 DIAGNOSIS — F4310 Post-traumatic stress disorder, unspecified: Secondary | ICD-10-CM

## 2023-10-27 DIAGNOSIS — F419 Anxiety disorder, unspecified: Secondary | ICD-10-CM

## 2023-10-27 DIAGNOSIS — F331 Major depressive disorder, recurrent, moderate: Secondary | ICD-10-CM

## 2023-10-28 ENCOUNTER — Other Ambulatory Visit (HOSPITAL_COMMUNITY): Payer: Self-pay | Admitting: Psychiatry

## 2023-10-28 DIAGNOSIS — F4310 Post-traumatic stress disorder, unspecified: Secondary | ICD-10-CM

## 2023-11-26 ENCOUNTER — Other Ambulatory Visit: Payer: Self-pay | Admitting: *Deleted

## 2023-11-26 DIAGNOSIS — N183 Chronic kidney disease, stage 3 unspecified: Secondary | ICD-10-CM

## 2023-11-26 DIAGNOSIS — E1169 Type 2 diabetes mellitus with other specified complication: Secondary | ICD-10-CM

## 2023-11-26 DIAGNOSIS — E559 Vitamin D deficiency, unspecified: Secondary | ICD-10-CM

## 2023-11-27 ENCOUNTER — Other Ambulatory Visit: Payer: Self-pay

## 2023-11-27 DIAGNOSIS — E1169 Type 2 diabetes mellitus with other specified complication: Secondary | ICD-10-CM

## 2023-11-27 DIAGNOSIS — N183 Chronic kidney disease, stage 3 unspecified: Secondary | ICD-10-CM

## 2023-11-27 DIAGNOSIS — E559 Vitamin D deficiency, unspecified: Secondary | ICD-10-CM

## 2023-11-28 ENCOUNTER — Ambulatory Visit: Payer: Self-pay

## 2023-11-28 ENCOUNTER — Other Ambulatory Visit: Payer: Self-pay

## 2023-11-28 LAB — LIPID PANEL
Chol/HDL Ratio: 7.3 ratio — ABNORMAL HIGH (ref 0.0–5.0)
Cholesterol, Total: 234 mg/dL — ABNORMAL HIGH (ref 100–199)
HDL: 32 mg/dL — ABNORMAL LOW (ref >39)
Triglycerides: 924 mg/dL (ref 0–149)

## 2023-11-28 LAB — COMPREHENSIVE METABOLIC PANEL WITH GFR
ALT: 51 IU/L — ABNORMAL HIGH (ref 0–44)
AST: 24 IU/L (ref 0–40)
Albumin: 4.7 g/dL (ref 4.1–5.1)
Alkaline Phosphatase: 101 IU/L (ref 44–121)
BUN/Creatinine Ratio: 14 (ref 9–20)
BUN: 16 mg/dL (ref 6–24)
Bilirubin Total: 0.7 mg/dL (ref 0.0–1.2)
CO2: 21 mmol/L (ref 20–29)
Calcium: 9.8 mg/dL (ref 8.7–10.2)
Chloride: 99 mmol/L (ref 96–106)
Creatinine, Ser: 1.12 mg/dL (ref 0.76–1.27)
Globulin, Total: 2.6 g/dL (ref 1.5–4.5)
Glucose: 145 mg/dL — ABNORMAL HIGH (ref 70–99)
Potassium: 4.7 mmol/L (ref 3.5–5.2)
Sodium: 139 mmol/L (ref 134–144)
Total Protein: 7.3 g/dL (ref 6.0–8.5)
eGFR: 85 mL/min/{1.73_m2} (ref >59)

## 2023-11-28 LAB — CBC WITH DIFFERENTIAL/PLATELET
Basophils Absolute: 0 10*3/uL (ref 0.0–0.2)
Basos: 1 %
EOS (ABSOLUTE): 0.4 10*3/uL (ref 0.0–0.4)
Eos: 7 %
Hematocrit: 47.6 % (ref 37.5–51.0)
Hemoglobin: 15.8 g/dL (ref 13.0–17.7)
Immature Grans (Abs): 0 10*3/uL (ref 0.0–0.1)
Immature Granulocytes: 0 %
Lymphocytes Absolute: 1.6 10*3/uL (ref 0.7–3.1)
Lymphs: 30 %
MCH: 30.7 pg (ref 26.6–33.0)
MCHC: 33.2 g/dL (ref 31.5–35.7)
MCV: 92 fL (ref 79–97)
Monocytes Absolute: 0.5 10*3/uL (ref 0.1–0.9)
Monocytes: 9 %
Neutrophils Absolute: 2.8 10*3/uL (ref 1.4–7.0)
Neutrophils: 53 %
Platelets: 373 10*3/uL (ref 150–450)
RBC: 5.15 x10E6/uL (ref 4.14–5.80)
RDW: 13.6 % (ref 11.6–15.4)
WBC: 5.3 10*3/uL (ref 3.4–10.8)

## 2023-11-28 LAB — HEMOGLOBIN A1C
Est. average glucose Bld gHb Est-mCnc: 157 mg/dL
Hgb A1c MFr Bld: 7.1 % — ABNORMAL HIGH (ref 4.8–5.6)

## 2023-11-28 LAB — MICROALBUMIN / CREATININE URINE RATIO
Creatinine, Urine: 143.8 mg/dL
Microalb/Creat Ratio: 4 mg/g{creat} (ref 0–29)
Microalbumin, Urine: 5.3 ug/mL

## 2023-11-28 LAB — VITAMIN D 25 HYDROXY (VIT D DEFICIENCY, FRACTURES): Vit D, 25-Hydroxy: 61.9 ng/mL (ref 30.0–100.0)

## 2023-11-28 MED ORDER — FENOFIBRATE 48 MG PO TABS: 48.0000 mg | ORAL_TABLET | Freq: Every day | ORAL | 3 refills | Status: DC

## 2023-11-29 ENCOUNTER — Other Ambulatory Visit (HOSPITAL_COMMUNITY): Payer: Self-pay | Admitting: Psychiatry

## 2023-11-29 ENCOUNTER — Other Ambulatory Visit: Payer: Self-pay | Admitting: *Deleted

## 2023-11-29 DIAGNOSIS — F331 Major depressive disorder, recurrent, moderate: Secondary | ICD-10-CM

## 2023-11-29 DIAGNOSIS — F4310 Post-traumatic stress disorder, unspecified: Secondary | ICD-10-CM

## 2023-11-29 MED ORDER — FENOFIBRATE 48 MG PO TABS: 48.0000 mg | ORAL_TABLET | Freq: Every day | ORAL | 3 refills | Status: DC

## 2023-11-29 NOTE — Progress Notes (Signed)
 Called and left voicemail; need patient  to return call to office. Kara sent in a new prescription.

## 2023-12-03 ENCOUNTER — Encounter (HOSPITAL_COMMUNITY): Payer: Self-pay | Admitting: Psychiatry

## 2023-12-03 ENCOUNTER — Telehealth (HOSPITAL_BASED_OUTPATIENT_CLINIC_OR_DEPARTMENT_OTHER): Admitting: Psychiatry

## 2023-12-03 VITALS — Wt 248.0 lb

## 2023-12-03 DIAGNOSIS — F419 Anxiety disorder, unspecified: Secondary | ICD-10-CM | POA: Diagnosis not present

## 2023-12-03 DIAGNOSIS — F1010 Alcohol abuse, uncomplicated: Secondary | ICD-10-CM

## 2023-12-03 DIAGNOSIS — F331 Major depressive disorder, recurrent, moderate: Secondary | ICD-10-CM

## 2023-12-03 DIAGNOSIS — Z630 Problems in relationship with spouse or partner: Secondary | ICD-10-CM

## 2023-12-03 DIAGNOSIS — F4310 Post-traumatic stress disorder, unspecified: Secondary | ICD-10-CM

## 2023-12-03 MED ORDER — LAMOTRIGINE 200 MG PO TABS
200.0000 mg | ORAL_TABLET | Freq: Every day | ORAL | 2 refills | Status: DC
Start: 2023-12-03 — End: 2024-03-03

## 2023-12-03 MED ORDER — FLUOXETINE HCL 20 MG PO CAPS: ORAL_CAPSULE | ORAL | 2 refills | Status: DC

## 2023-12-03 MED ORDER — HYDROXYZINE PAMOATE 25 MG PO CAPS: 25.0000 mg | ORAL_CAPSULE | Freq: Three times a day (TID) | ORAL | 1 refills | Status: DC

## 2023-12-03 NOTE — Progress Notes (Signed)
 [facility] MD Virtual Progress Note   Patient Location: Work Provider Location: Home Office  I connect with patient by video and verified that I am speaking with correct person by using two identifiers. I discussed the limitations of evaluation and management by telemedicine and the availability of in person appointments. I also discussed with the patient that there may be a patient responsible charge related to this service. The patient expressed understanding and agreed to proceed.  Harold Collins [id number] 43 y.o.  12/03/2023 10:01 AM  History of Present Illness:  Patient is evaluated by video session.  He reported lately a lot of stress.  He is worried about his job.  Patient told company is not doing very well but so far he is working.  He also reported some marital issues as having trusting his wife and he was staying downstairs but now back upstairs with his wife.  Patient told things are somewhat better.  Recently had a blood work and triglycerides further increase in hemoglobin A1c 7.1.  He is scheduled to see his PCP.  He reported increase alcohol intake and he is drinking 3 shots most of the nights.  He reported nightmares flashbacks.  He also not walking or exercising.  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  Patient works for [employer].  However he feels happy as 69 year old daughter now moved in with them..  Patient told from March 2 June is difficult months because he had lost many people around that time.  He lost his parents, cousins, uncle and multiple friends.  He admitted that he need to focus on her general health, exercise but he has not done so far.  Sometime he goes with his 20 year old son for fishing.  Patient lives with his wife and 59 year old son.  Denies any agitation, anger, mania, hallucination, suicidal thoughts.  He denies any anger issues.  He does not feel hopeless or worthless but admitted stress from work is very  real because he may lose his job.  Denies any panic attack.  His plan is to keep the appointment with primary care and take the medicine for his increased triglycerides and blood sugar.  His ALT is mildly elevated.  Denies any active or passive suicidal thoughts.  Past Psychiatric History: H/O depression as a teenager. H/O cut himself and overdose but no inpatient treatment.  H/O severe mood swing, night mares, anger,  fights and depression.  While in [employer] spend 10 days jail due to assault charges. Tried Paxil, BuSpar , Lexapro, Brintellix and Effexor .  Trazodone  did not help for sleep.  Seroquel  caused increased triglycerides.  Mirtazapine  worked for a while.  We did gene testing Prozac  and Lamictal  are favorable.        Outpatient Encounter Medications as of 12/03/2023  Medication Sig   Cholecalciferol  (VITAMIN D3) 50 MCG (2000 UT) capsule Take 1 capsule by mouth daily.   fenofibrate  (TRICOR ) 48 MG tablet Take 1 tablet (48 mg total) by mouth daily.   FLUoxetine  (PROZAC ) 20 MG capsule Take 1 capsule by mouth once daily   hydrOXYzine  (VISTARIL ) 25 MG capsule Take 1 capsule (25 mg total) by mouth 3 (three) times daily.   lamoTRIgine  (LAMICTAL ) 200 MG tablet Take 1 tablet (200 mg total) by mouth daily.   niacin  (NIASPAN ) 1000 MG CR tablet Take 1 tablet (1,000 mg total) by mouth in the morning and at bedtime.   omeprazole  (PRILOSEC) 40 MG capsule Take 1 capsule by  mouth once daily   No facility-administered encounter medications on file as of 12/03/2023.    Recent Results (from the past 2160 hours)  VITAMIN D  25 Hydroxy (Vit-D Deficiency, Fractures)     Status: None   Collection Time: 11/27/23  9:59 AM  Result Value Ref Range   Vit D, 25-Hydroxy 61.9 30.0 - 100.0 ng/mL    Comment: Vitamin D  deficiency has been defined by the Institute of Medicine and an Endocrine Society practice guideline as a level of serum 25-OH vitamin D  less than 20 ng/mL (1,2). The Endocrine Society went on to further  define vitamin D  insufficiency as a level between 21 and 29 ng/mL (2). 1. IOM (Institute of Medicine). 2010. Dietary reference    intakes for calcium  and D. [place]: The    [employer]. 2. Holick MF, Binkley , Bischoff-Ferrari HA, et al.    Evaluation, treatment, and prevention of vitamin D     deficiency: an Endocrine Society clinical practice    guideline. JCEM. 2011 Jul; 96(7):1911-30.   Microalbumin / creatinine urine ratio     Status: None   Collection Time: 11/27/23  9:59 AM  Result Value Ref Range   Creatinine, Urine 143.8 Not Estab. mg/dL   Microalbumin, Urine 5.3 Not Estab. ug/mL   Microalb/Creat Ratio 4 0 - 29 mg/g creat    Comment:                        Normal:                0 -  29                        Moderately increased: 30 - 300                        Severely increased:       >300   Hemoglobin A1c     Status: Abnormal   Collection Time: 11/27/23  9:59 AM  Result Value Ref Range   Hgb A1c MFr Bld 7.1 (H) 4.8 - 5.6 %    Comment:          Prediabetes: 5.7 - 6.4          Diabetes: >6.4          Glycemic control for adults with diabetes: <7.0    Est. average glucose Bld gHb Est-mCnc 157 mg/dL  Lipid panel     Status: Abnormal   Collection Time: 11/27/23  9:59 AM  Result Value Ref Range   Cholesterol, Total 234 (H) 100 - 199 mg/dL   Triglycerides 409 (HH) 0 - 149 mg/dL    Comment: Results confirmed on dilution.    HDL 32 (L) >39 mg/dL   VLDL Cholesterol Cal Comment (A) 5 - 40 mg/dL    Comment: The calculation for the VLDL cholesterol is not valid when triglyceride level is >800 mg/dL.    LDL Chol Calc (NIH) Comment (A) 0 - 99 mg/dL    Comment: Triglyceride result indicated is too high for an accurate LDL cholesterol estimation.    LDL CALC COMMENT: Comment     Comment: In the absence of the LDL-c value, if the Total Cholesterol (TC) is >260 mg/dL for those <81 years old or >290 for those >/=42 years old, consider evaluating for  Familial Hypercholesterolemia(FH) if clinically indicated. If the TC is below these limits, the probability of  FH cannot be determined.    Chol/HDL Ratio 7.3 (H) 0.0 - 5.0 ratio    Comment:                                   T. Chol/HDL Ratio                                             Men  Women                               1/2 Avg.Risk  3.4    3.3                                   Avg.Risk  5.0    4.4                                2X Avg.Risk  9.6    7.1                                3X Avg.Risk 23.4   11.0   Comprehensive metabolic panel with GFR     Status: Abnormal   Collection Time: 11/27/23  9:59 AM  Result Value Ref Range   Glucose 145 (H) 70 - 99 mg/dL   BUN 16 6 - 24 mg/dL   Creatinine, Ser 1.61 0.76 - 1.27 mg/dL   eGFR 85 >09 UE/AVW/0.98   BUN/Creatinine Ratio 14 9 - 20   Sodium 139 134 - 144 mmol/L   Potassium 4.7 3.5 - 5.2 mmol/L   Chloride 99 96 - 106 mmol/L   CO2 21 20 - 29 mmol/L   Calcium  9.8 8.7 - 10.2 mg/dL   Total Protein 7.3 6.0 - 8.5 g/dL   Albumin 4.7 4.1 - 5.1 g/dL   Globulin, Total 2.6 1.5 - 4.5 g/dL   Bilirubin Total 0.7 0.0 - 1.2 mg/dL   Alkaline Phosphatase 101 44 - 121 IU/L   AST 24 0 - 40 IU/L   ALT 51 (H) 0 - 44 IU/L  CBC with Differential/Platelet     Status: None   Collection Time: 11/27/23  9:59 AM  Result Value Ref Range   WBC 5.3 3.4 - 10.8 x10E3/uL   RBC 5.15 4.14 - 5.80 x10E6/uL   Hemoglobin 15.8 13.0 - 17.7 g/dL   Hematocrit 11.9 14.7 - 51.0 %   MCV 92 79 - 97 fL   MCH 30.7 26.6 - 33.0 pg   MCHC 33.2 31.5 - 35.7 g/dL   RDW 82.9 56.2 - 13.0 %   Platelets 373 150 - 450 x10E3/uL   Neutrophils 53 Not Estab. %   Lymphs 30 Not Estab. %   Monocytes 9 Not Estab. %   Eos 7 Not Estab. %   Basos 1 Not Estab. %   Neutrophils Absolute 2.8 1.4 - 7.0 x10E3/uL   Lymphocytes Absolute 1.6 0.7 - 3.1 x10E3/uL   Monocytes Absolute 0.5 0.1 - 0.9 x10E3/uL   EOS (ABSOLUTE) 0.4 0.0 - 0.4 x10E3/uL   Basophils Absolute 0.0 0.0 - 0.2 x10E3/uL  Immature Granulocytes 0 Not Estab. %   Immature Grans (Abs) 0.0 0.0 - 0.1 x10E3/uL     Psychiatric Specialty Exam: Physical Exam  Review of Systems  Weight 248 lb (112.5 kg).There is no height or weight on file to calculate BMI.  General Appearance: Casual and work jacket  Eye Contact:  Good  Speech:  Clear and Coherent  Volume:  Normal  Mood:  Anxious and Depressed  Affect:  Congruent  Thought Process:  Goal Directed  Orientation:  Full (Time, Place, and Person)  Thought Content:  Rumination  Suicidal Thoughts:  No  Homicidal Thoughts:  No  Memory:  Immediate;   Good Recent;   Good Remote;   Good  Judgement:  Good  Insight:  Good  Psychomotor Activity:  Normal  Concentration:  Concentration: Good and Attention Span: Good  Recall:  Good  Fund of Knowledge:  Good  Language:  Good  Akathisia:  No  Handed:  Right  AIMS (if indicated):     Assets:  Communication Skills Desire for Improvement Housing Social Support Talents/Skills Transportation  ADL's:  Intact  Cognition:  WNL  Sleep:  ok       12/03/2023   10:18 AM 04/19/2021   10:11 AM 01/27/2021   11:31 AM 01/21/2021    4:05 AM 08/25/2020    3:58 PM  Depression screen PHQ 2/9  Decreased Interest 1 3 2 1 1   Down, Depressed, Hopeless 1 3 2 1 1   PHQ - 2 Score 2 6 4 2 2   Altered sleeping 1 1 0 0 1  Tired, decreased energy 1 3 2  0 0  Change in appetite 1 1 1  0 0  Feeling bad or failure about yourself  0 1 2 2  0  Trouble concentrating 0 1 1 1  0  Moving slowly or fidgety/restless 0 0 0 0 0  Suicidal thoughts 0 0 0 2 0  PHQ-9 Score 5 13 10 7 3   Difficult doing work/chores Somewhat difficult  Somewhat difficult Somewhat difficult Not difficult at all    Assessment/Plan: PTSD (post-traumatic stress disorder)  MDD (major depressive disorder), recurrent episode, moderate (HCC)  Anxiety  Reviewed blood work results.  Very high triglycerides.  Hemoglobin A1c 7.1.  Mild elevation of one of the liver enzymes.  PHQ not  very high as patient feel that he can able to control his stress  Strongly recommend to stop the alcohol which he is drinking 3 shots most of the nights.  I also encourage therapy but patient feels that he can control his symptoms without therapy.  Encouraged to keep appointment with primary care and recommend to take the medicine but PCP advised.  He is taking hydroxyzine  25 mg 2 times a day and I recommend he can take up to 3 times a day if needed for anxiety.  Continue Prozac  20 mg daily and Lamictal  200 mg daily.  He has no rash, itching, tremors or shakes.  I discussed other treatment options however like to have his labs better and he need to stop the drinking.  Discussed safety concerns at any time having active suicidal thoughts or homicidal thoughts.  Call 911 or go to local emergency room.  Patient like to keep the appointment in 3 months however agreed to give us  a call back if he needed a sooner appointment.   Follow Up Instructions:     I discussed the assessment and treatment plan with the patient. The patient was provided an opportunity to ask questions  and all were answered. The patient agreed with the plan and demonstrated an understanding of the instructions.   The patient was advised to call back or seek an in-person evaluation if the symptoms worsen or if the condition fails to improve as anticipated.    Collaboration of Care: Other provider involved in patient's care AEB notes are available in epic to review  Patient/Guardian was advised Release of Information must be obtained prior to any record release in order to collaborate their care with an outside provider. Patient/Guardian was advised if they have not already done so to contact the registration department to sign all necessary forms in order for us  to release information regarding their care.   Consent: Patient/Guardian gives verbal consent for treatment and assignment of benefits for services provided during this visit.  Patient/Guardian expressed understanding and agreed to proceed.     Total encounter time 28 minutes which includes face-to-face time, chart reviewed, care coordination, order entry and documentation during this encounter.   Note: This document was prepared by [employer] voice dictation technology and any errors that results from this process are unintentional.    Arturo Late, MD 12/03/2023

## 2023-12-04 ENCOUNTER — Ambulatory Visit (INDEPENDENT_AMBULATORY_CARE_PROVIDER_SITE_OTHER): Payer: Self-pay

## 2023-12-04 VITALS — BP 123/81 | HR 78 | Ht 71.0 in | Wt 246.8 lb

## 2023-12-04 DIAGNOSIS — N1831 Chronic kidney disease, stage 3a: Secondary | ICD-10-CM

## 2023-12-04 DIAGNOSIS — B370 Candidal stomatitis: Secondary | ICD-10-CM | POA: Diagnosis not present

## 2023-12-04 DIAGNOSIS — E0822 Diabetes mellitus due to underlying condition with diabetic chronic kidney disease: Secondary | ICD-10-CM

## 2023-12-04 DIAGNOSIS — Z23 Encounter for immunization: Secondary | ICD-10-CM

## 2023-12-04 DIAGNOSIS — F332 Major depressive disorder, recurrent severe without psychotic features: Secondary | ICD-10-CM

## 2023-12-04 DIAGNOSIS — E559 Vitamin D deficiency, unspecified: Secondary | ICD-10-CM

## 2023-12-04 DIAGNOSIS — E782 Mixed hyperlipidemia: Secondary | ICD-10-CM

## 2023-12-04 DIAGNOSIS — E1169 Type 2 diabetes mellitus with other specified complication: Secondary | ICD-10-CM | POA: Diagnosis not present

## 2023-12-04 DIAGNOSIS — R7401 Elevation of levels of liver transaminase levels: Secondary | ICD-10-CM

## 2023-12-04 MED ORDER — SEMAGLUTIDE (1 MG/DOSE) 4 MG/3ML ~~LOC~~ SOPN: 1.0000 mg | PEN_INJECTOR | SUBCUTANEOUS | 0 refills | Status: DC

## 2023-12-04 MED ORDER — FLUCONAZOLE 150 MG PO TABS: 150.0000 mg | ORAL_TABLET | Freq: Once | ORAL | 0 refills | Status: DC

## 2023-12-04 MED ORDER — OZEMPIC (0.25 OR 0.5 MG/DOSE) 2 MG/3ML ~~LOC~~ SOPN: 0.5000 mg | PEN_INJECTOR | SUBCUTANEOUS | 0 refills | Status: DC

## 2023-12-04 MED ORDER — OZEMPIC (0.25 OR 0.5 MG/DOSE) 2 MG/3ML ~~LOC~~ SOPN: 0.2500 mg | PEN_INJECTOR | SUBCUTANEOUS | 1 refills | Status: DC

## 2023-12-04 MED ORDER — FLUCONAZOLE 100 MG PO TABS: ORAL_TABLET | ORAL | 0 refills | Status: AC

## 2023-12-04 NOTE — Progress Notes (Addendum)
 Complete physical exam  Patient: Harold Collins   DOB: [birth year]   43 y.o. Male  MRN: [id number]  Subjective:     Chief Complaint  Patient presents with   Annual Exam    Harold Collins is a 43 y.o. male who presents today for a complete physical exam. He reports consuming a general diet. The patient has a physically strenuous job, but has no regular exercise apart from work.  He generally feels well. He reports sleeping fairly well.  Patient reports that he has quit smoking cigars after smoking them for over 20 years, however he reports that he vapes instead.  Patient also reports that after many years of not drinking he has started having around 2 standard drinks per night.  He does have additional problems to discuss today.   HLD: Last triglyceride level came back in the critical range ~900 last week. He is taking Niacin  1000 mg BID and reports excellent compliance. Denies side effects. At the time his lab results came in, we also added fenofibrate  48 mg daily to his regimen. Denies side effects from this medication. Denies CP, SOB, palpitations, edema. The 10-year ASCVD risk score (Arnett DK, et al., 2019) is: 17.2%.  Patient reports that he has been on a statin medication in the past, but does not remember why it was ever discontinued.  Patient would like to defer discussion regarding statin initiation to his follow-up in 3 months.  T2DM: A1c was 7.2 in February with his last visit with [facility]. It was discussed then that he would try to control A1c with diet and exercise. Most recent A1c came back last week at 7.1. Patient endorses that he would like to discuss options that would assist with A1c control as well as help with weight loss.  . Denies polydipsia, polyuria, new wounds/sores that are not healing.   Oral thrush: Patient reports that he has noticed a white substance covering the surface of his tongue.  He denies any associated pain, distorted taste, bleeding, sores or ulcers.  Reports  that he has been treated for this in the past, but is requesting a refill on the antifungal.    Most recent fall risk assessment:    08/08/2023    8:46 AM  Fall Risk   Falls in the past year? 0  Number falls in past yr: 0  Injury with Fall? 0  Risk for fall due to : No Fall Risks  Follow up Falls evaluation completed     Most recent depression screenings:    12/04/2023    8:19 AM 12/03/2023   10:18 AM  PHQ 2/9 Scores  PHQ - 2 Score 4   PHQ- 9 Score 17      Information is confidential and restricted. Go to Review Flowsheets to unlock data.        Patient Care Team: Gayle Saddie JULIANNA DEVONNA as PCP - General (Physician Assistant) Melita Drivers, MD as Consulting Physician (Orthopedic Surgery) Curry Leni DASEN, MD as Consulting Physician (Psychiatry)   Outpatient Medications Prior to Visit  Medication Sig   Cholecalciferol  (VITAMIN D3) 50 MCG (2000 UT) capsule Take 1 capsule by mouth daily.   fenofibrate  (TRICOR ) 48 MG tablet Take 1 tablet (48 mg total) by mouth daily.   FLUoxetine  (PROZAC ) 20 MG capsule Take 1 capsule by mouth once daily   hydrOXYzine  (VISTARIL ) 25 MG capsule Take 1 capsule (25 mg total) by mouth 3 (three) times daily.   lamoTRIgine  (LAMICTAL ) 200 MG tablet Take 1  tablet (200 mg total) by mouth daily.   niacin  (NIASPAN ) 1000 MG CR tablet Take 1 tablet (1,000 mg total) by mouth in the morning and at bedtime.   omeprazole  (PRILOSEC) 40 MG capsule Take 1 capsule by mouth once daily   No facility-administered medications prior to visit.    ROS  As noted in HPI      Objective:     BP 123/81   Pulse 78   Ht 5' 11 (1.803 m)   Wt 246 lb 12 oz (111.9 kg)   SpO2 97%   BMI 34.41 kg/m    Physical Exam Constitutional:      General: He is not in acute distress.    Appearance: Normal appearance.  HENT:     Mouth/Throat:     Mouth: Mucous membranes are moist.     Dentition: No gingival swelling, dental abscesses or gum lesions.     Comments: Mild white  patchiness noted to surface of tongue without pharynx involvement.  Cardiovascular:     Rate and Rhythm: Normal rate and regular rhythm.     Heart sounds: Normal heart sounds. No murmur heard.    No friction rub. No gallop.  Pulmonary:     Effort: Pulmonary effort is normal. No respiratory distress.     Breath sounds: Normal breath sounds.  Musculoskeletal:        General: No swelling.     Cervical back: Neck supple.  Lymphadenopathy:     Cervical: No cervical adenopathy.  Skin:    General: Skin is warm and dry.  Neurological:     General: No focal deficit present.     Mental Status: He is alert.  Psychiatric:        Mood and Affect: Mood normal.        Behavior: Behavior normal.        Thought Content: Thought content normal.      No results found for any visits on 12/04/23. Last CBC Lab Results  Component Value Date   WBC 5.3 11/27/2023   HGB 15.8 11/27/2023   HCT 47.6 11/27/2023   MCV 92 11/27/2023   MCH 30.7 11/27/2023   RDW 13.6 11/27/2023   PLT 373 11/27/2023   Last metabolic panel Lab Results  Component Value Date   GLUCOSE 145 (H) 11/27/2023   NA 139 11/27/2023   K 4.7 11/27/2023   CL 99 11/27/2023   CO2 21 11/27/2023   BUN 16 11/27/2023   CREATININE 1.12 11/27/2023   EGFR 85 11/27/2023   CALCIUM  9.8 11/27/2023   PHOS 3.2 07/25/2017   PROT 7.3 11/27/2023   ALBUMIN 4.7 11/27/2023   LABGLOB 2.6 11/27/2023   AGRATIO 2.1 04/19/2021   BILITOT 0.7 11/27/2023   ALKPHOS 101 11/27/2023   AST 24 11/27/2023   ALT 51 (H) 11/27/2023   ANIONGAP 9 01/20/2021   Last lipids Lab Results  Component Value Date   CHOL 234 (H) 11/27/2023   HDL 32 (L) 11/27/2023   LDLCALC Comment (A) 11/27/2023   LDLDIRECT 95.7 01/22/2021   TRIG 924 (HH) 11/27/2023   CHOLHDL 7.3 (H) 11/27/2023   Last hemoglobin A1c Lab Results  Component Value Date   HGBA1C 7.1 (H) 11/27/2023   Last thyroid functions Lab Results  Component Value Date   TSH 1.510 08/08/2023   Last  vitamin D  Lab Results  Component Value Date   VD25OH 61.9 11/27/2023        Assessment & Plan:    Routine Health Maintenance  and Physical Exam  Health Maintenance  Topic Date Due   Eye exam for diabetics  Never done   HPV Vaccine (1 - 3-dose SCDM series) Never done   COVID-19 Vaccine (2 - Janssen risk series) 12/19/2019   Pneumococcal Vaccination (2 of 2 - PCV) 12/03/2024*   Flu Shot  01/25/2024   Hemoglobin A1C  05/28/2024   Yearly kidney function blood test for diabetes  11/26/2024   Yearly kidney health urinalysis for diabetes  11/26/2024   Complete foot exam   12/03/2024   DTaP/Tdap/Td vaccine (4 - Td or Tdap) 12/03/2033   Hepatitis B Vaccine  Completed   Hepatitis C Screening  Completed   HIV Screening  Completed   Meningitis B Vaccine  Aged Out  *Topic was postponed. The date shown is not the original due date.    Discussed health benefits of physical activity, and encouraged him to engage in regular exercise appropriate for his age and condition.  Oral thrush Assessment & Plan: Start fluconazole  200 mg on the first day then 100 mg every day thereafter for 14 days.  Advised patient that if the thrush does not get better or returns, to let me know and we can try Magic mouthwash instead.   Mixed diabetic hyperlipidemia associated with type 2 diabetes mellitus (HCC) Assessment & Plan: Continue niacin  1000 mg twice per day and fenofibrate  48 mg daily for severely elevated triglycerides.  Patient deferred further discussion of statin initiation to his follow-up in 3 months.  The 10-year ASCVD risk score (Arnett DK, et al., 2019) is: 17.2%.  Stressed the importance of getting his triglycerides out of critical levels to avoid progression to pancreatitis.  Will recheck lipid panel in 3 months and initiate statin therapy if still significantly elevated.    Vitamin D  deficiency Assessment & Plan: Most recent vitamin D  level within normal range.  Continue daily vitamin D   supplement.  Will continue to monitor.   Elevated ALT measurement- uncertain etiology Assessment & Plan: Most recent ALT elevated at 51.  Patient does endorse that he has been drinking 2-3 standard drinks per night over the last several weeks due to increased stress, which is likely the cause of this elevation.  Encouraged patient to working with psychiatry and cut back on his drinking as much as possible.  Will continue to monitor CMP periodically.   MDD (major depressive disorder), recurrent severe, without psychosis (HCC) Assessment & Plan: Patient currently following with psychiatry on Lamictal  200 mg daily, hydroxyzine  25 mg up to 3 times a day as needed, Prozac  20 mg daily.  Mood is stable and patient feels like his symptoms are well-controlled on this current medication regimen.  Encourage patient to continue following up with psychiatry regularly and follow their recommendations for treatment.  Will continue to monitor.   Diabetes mellitus due to underlying condition with stage 3a chronic kidney disease, without long-term current use of insulin (HCC) Assessment & Plan: He was previously controlling A1c with diet and exercise.  A1c slightly improved today at 7.1.  After discussion with patient about his goals for A1c control and weight loss, start Ozempic  0.25 mg weekly for A1c control.  Patient advised that this medication can cause nausea and constipation.  Instructed patient to use over-the-counter MiraLAX to treat constipation if needed.  Patient has tried Metformin  and Glipizide  in the past but could not tolerate due to GI side effects/weakness. Will follow-up with patient in 3 months to recheck A1c and assess progress.  Addendum: Insurance  denied the request for Ozempic . To aid in A1c control, start Trulicity  0.75 mg x 4 weeks, then increase to 1.5 mg weekly thereafter.    Other orders -     Tdap vaccine greater than or equal to 7yo IM -     Fluconazole ; Take 2 tablets (200 mg) on  first day, then 1 tablet every day thereafter for 14 days.  Dispense: 16 tablet; Refill: 0     Return in about 3 months (around 03/05/2024) for HLD, DM.     Saddie JULIANNA Sacks, PA-C

## 2023-12-04 NOTE — Assessment & Plan Note (Signed)
 Patient currently following with psychiatry on Lamictal  200 mg daily, hydroxyzine  25 mg up to 3 times a day as needed, Prozac  20 mg daily.  Mood is stable and patient feels like his symptoms are well-controlled on this current medication regimen.  Encourage patient to continue following up with psychiatry regularly and follow their recommendations for treatment.  Will continue to monitor.

## 2023-12-04 NOTE — Assessment & Plan Note (Addendum)
 Continue niacin  1000 mg twice per day and fenofibrate  48 mg daily for severely elevated triglycerides.  Patient deferred further discussion of statin initiation to his follow-up in 3 months.  The 10-year ASCVD risk score (Arnett DK, et al., 2019) is: 17.2%.  Stressed the importance of getting his triglycerides out of critical levels to avoid progression to pancreatitis.  Will recheck lipid panel in 3 months and initiate statin therapy if still significantly elevated.

## 2023-12-04 NOTE — Assessment & Plan Note (Addendum)
 He was previously controlling A1c with diet and exercise.  A1c slightly improved today at 7.1.  After discussion with patient about his goals for A1c control and weight loss, start Ozempic  0.25 mg weekly for A1c control.  Patient advised that this medication can cause nausea and constipation.  Instructed patient to use over-the-counter MiraLAX to treat constipation if needed.  Patient has tried Metformin  and Glipizide  in the past but could not tolerate due to GI side effects/weakness. Will follow-up with patient in 3 months to recheck A1c and assess progress.  Addendum: Insurance denied the request for Ozempic . To aid in A1c control, start Trulicity  0.75 mg x 4 weeks, then increase to 1.5 mg weekly thereafter.

## 2023-12-04 NOTE — Assessment & Plan Note (Signed)
 Start fluconazole  200 mg on the first day then 100 mg every day thereafter for 14 days.  Advised patient that if the thrush does not get better or returns, to let me know and we can try Magic mouthwash instead.

## 2023-12-04 NOTE — Patient Instructions (Addendum)
 It was nice to see you today!  As we discussed in clinic:  -Continue fenofibrate  and niacin  for your elevated triglycerides. -Start Ozempic 0.25 mg injection once weekly for diabetes. -I have also sent in a dose of diflucan  for your thrush. If it does not improve, let me know and I can send in an anti-fungal mouthwash instead. -Let's plan to follow up again in 3 months to assess your progress.  It was nice to meet you!   If you have any problems before your next visit feel free to message me via MyChart (minor issues or questions) or call the office, otherwise you may reach out to schedule an office visit.  Thank you! Meryl Acosta, PA-C

## 2023-12-04 NOTE — Assessment & Plan Note (Signed)
 Most recent ALT elevated at 51.  Patient does endorse that he has been drinking 2-3 standard drinks per night over the last several weeks due to increased stress, which is likely the cause of this elevation.  Encouraged patient to working with psychiatry and cut back on his drinking as much as possible.  Will continue to monitor CMP periodically.

## 2023-12-04 NOTE — Assessment & Plan Note (Signed)
 Most recent vitamin D  level within normal range.  Continue daily vitamin D  supplement.  Will continue to monitor.

## 2023-12-11 ENCOUNTER — Other Ambulatory Visit: Payer: Self-pay

## 2023-12-11 MED ORDER — TRULICITY 1.5 MG/0.5ML ~~LOC~~ SOAJ: 1.5000 mg | SUBCUTANEOUS | 1 refills | Status: AC

## 2023-12-11 MED ORDER — TRULICITY 0.75 MG/0.5ML ~~LOC~~ SOAJ: 0.7500 mg | SUBCUTANEOUS | 0 refills | Status: AC

## 2023-12-11 NOTE — Progress Notes (Unsigned)
 Patient sent a message saying that his Ozempic  0.25 mg pens were not available at the pharmacy with his other meds. After further investigation, discovered Ozempic  is not the preferred GLP1 on his formulary. Have discontinued Ozempic  order and replaced with Trulicity. 0.75 mg Trulicity sent in for a 1 month supply, with plans to increase to 1.5 mg thereafter and recheck A1c at follow up.

## 2024-01-08 ENCOUNTER — Other Ambulatory Visit: Payer: Self-pay

## 2024-02-20 ENCOUNTER — Other Ambulatory Visit: Payer: Self-pay

## 2024-02-20 DIAGNOSIS — E1169 Type 2 diabetes mellitus with other specified complication: Secondary | ICD-10-CM

## 2024-02-20 DIAGNOSIS — N1831 Chronic kidney disease, stage 3a: Secondary | ICD-10-CM

## 2024-02-20 DIAGNOSIS — E559 Vitamin D deficiency, unspecified: Secondary | ICD-10-CM

## 2024-02-23 ENCOUNTER — Other Ambulatory Visit (HOSPITAL_COMMUNITY): Payer: Self-pay | Admitting: Psychiatry

## 2024-02-23 DIAGNOSIS — F331 Major depressive disorder, recurrent, moderate: Secondary | ICD-10-CM

## 2024-02-23 DIAGNOSIS — F4310 Post-traumatic stress disorder, unspecified: Secondary | ICD-10-CM

## 2024-02-27 ENCOUNTER — Other Ambulatory Visit

## 2024-02-27 ENCOUNTER — Other Ambulatory Visit (HOSPITAL_COMMUNITY): Payer: Self-pay | Admitting: Psychiatry

## 2024-02-27 DIAGNOSIS — E0822 Diabetes mellitus due to underlying condition with diabetic chronic kidney disease: Secondary | ICD-10-CM

## 2024-02-27 DIAGNOSIS — E559 Vitamin D deficiency, unspecified: Secondary | ICD-10-CM

## 2024-02-27 DIAGNOSIS — F4310 Post-traumatic stress disorder, unspecified: Secondary | ICD-10-CM

## 2024-02-27 DIAGNOSIS — E1169 Type 2 diabetes mellitus with other specified complication: Secondary | ICD-10-CM

## 2024-02-28 ENCOUNTER — Other Ambulatory Visit: Payer: Self-pay

## 2024-02-28 LAB — COMPREHENSIVE METABOLIC PANEL WITH GFR
ALT: 57 IU/L — ABNORMAL HIGH (ref 0–44)
AST: 29 IU/L (ref 0–40)
Albumin: 4.7 g/dL (ref 4.1–5.1)
Alkaline Phosphatase: 83 IU/L (ref 44–121)
BUN/Creatinine Ratio: 13 (ref 9–20)
BUN: 16 mg/dL (ref 6–24)
Bilirubin Total: 0.5 mg/dL (ref 0.0–1.2)
CO2: 20 mmol/L (ref 20–29)
Calcium: 9.5 mg/dL (ref 8.7–10.2)
Chloride: 101 mmol/L (ref 96–106)
Creatinine, Ser: 1.21 mg/dL (ref 0.76–1.27)
Globulin, Total: 2.1 g/dL (ref 1.5–4.5)
Glucose: 161 mg/dL — ABNORMAL HIGH (ref 70–99)
Potassium: 4.7 mmol/L (ref 3.5–5.2)
Sodium: 138 mmol/L (ref 134–144)
Total Protein: 6.8 g/dL (ref 6.0–8.5)
eGFR: 77 mL/min/1.73 (ref >59)

## 2024-02-28 LAB — CBC WITH DIFFERENTIAL/PLATELET
Basophils Absolute: 0.1 x10E3/uL (ref 0.0–0.2)
Basos: 1 %
EOS (ABSOLUTE): 0.2 x10E3/uL (ref 0.0–0.4)
Eos: 5 %
Hematocrit: 43.8 % (ref 37.5–51.0)
Hemoglobin: 14.8 g/dL (ref 13.0–17.7)
Immature Grans (Abs): 0 x10E3/uL (ref 0.0–0.1)
Immature Granulocytes: 0 %
Lymphocytes Absolute: 1.3 x10E3/uL (ref 0.7–3.1)
Lymphs: 30 %
MCH: 31.4 pg (ref 26.6–33.0)
MCHC: 33.8 g/dL (ref 31.5–35.7)
MCV: 93 fL (ref 79–97)
Monocytes Absolute: 0.5 x10E3/uL (ref 0.1–0.9)
Monocytes: 11 %
Neutrophils Absolute: 2.1 x10E3/uL (ref 1.4–7.0)
Neutrophils: 52 %
Platelets: 364 x10E3/uL (ref 150–450)
RBC: 4.72 x10E6/uL (ref 4.14–5.80)
RDW: 13.1 % (ref 11.6–15.4)
WBC: 4.1 x10E3/uL (ref 3.4–10.8)

## 2024-02-28 LAB — LIPID PANEL
Chol/HDL Ratio: 6.4 ratio — ABNORMAL HIGH (ref 0.0–5.0)
Cholesterol, Total: 210 mg/dL — ABNORMAL HIGH (ref 100–199)
HDL: 33 mg/dL — ABNORMAL LOW (ref >39)
LDL Chol Calc (NIH): 76 mg/dL (ref 0–99)
Triglycerides: 635 mg/dL (ref 0–149)
VLDL Cholesterol Cal: 101 mg/dL — ABNORMAL HIGH (ref 5–40)

## 2024-02-28 LAB — HEMOGLOBIN A1C
Est. average glucose Bld gHb Est-mCnc: 169 mg/dL
Hgb A1c MFr Bld: 7.5 % — ABNORMAL HIGH (ref 4.8–5.6)

## 2024-02-28 LAB — VITAMIN D 25 HYDROXY (VIT D DEFICIENCY, FRACTURES): Vit D, 25-Hydroxy: 66.7 ng/mL (ref 30.0–100.0)

## 2024-02-28 LAB — TSH: TSH: 3.81 u[IU]/mL (ref 0.450–4.500)

## 2024-02-29 ENCOUNTER — Telehealth: Payer: Self-pay | Admitting: Gastroenterology

## 2024-02-29 ENCOUNTER — Ambulatory Visit: Payer: Self-pay

## 2024-02-29 MED ORDER — OMEPRAZOLE 40 MG PO CPDR: 40.0000 mg | DELAYED_RELEASE_CAPSULE | Freq: Every day | ORAL | 0 refills | Status: DC

## 2024-02-29 NOTE — Telephone Encounter (Signed)
 Prescription sent to patient's pharmacy but informed patient on script that he needs to schedule an appt for future refills.

## 2024-02-29 NOTE — Telephone Encounter (Signed)
 Received a call from patient stating he is in need of another refill of his Omeprazole  40MG  prescription. Requesting that is be sent to [facility] [place]. Please review and advise.   Thank you

## 2024-03-03 ENCOUNTER — Telehealth (HOSPITAL_BASED_OUTPATIENT_CLINIC_OR_DEPARTMENT_OTHER): Admitting: Psychiatry

## 2024-03-03 ENCOUNTER — Encounter (HOSPITAL_COMMUNITY): Payer: Self-pay | Admitting: Psychiatry

## 2024-03-03 VITALS — Wt 246.0 lb

## 2024-03-03 DIAGNOSIS — F331 Major depressive disorder, recurrent, moderate: Secondary | ICD-10-CM | POA: Diagnosis not present

## 2024-03-03 DIAGNOSIS — F419 Anxiety disorder, unspecified: Secondary | ICD-10-CM | POA: Diagnosis not present

## 2024-03-03 DIAGNOSIS — F4310 Post-traumatic stress disorder, unspecified: Secondary | ICD-10-CM

## 2024-03-03 MED ORDER — HYDROXYZINE PAMOATE 25 MG PO CAPS: 25.0000 mg | ORAL_CAPSULE | Freq: Two times a day (BID) | ORAL | 2 refills | Status: DC | PRN

## 2024-03-03 MED ORDER — FLUOXETINE HCL 20 MG PO CAPS: ORAL_CAPSULE | ORAL | 2 refills | Status: DC

## 2024-03-03 MED ORDER — LAMOTRIGINE 200 MG PO TABS: 200.0000 mg | ORAL_TABLET | Freq: Every day | ORAL | 2 refills | Status: DC

## 2024-03-03 NOTE — Progress Notes (Signed)
 [facility] MD Virtual Progress Note   Patient Location: Work Provider Location: Home Office  I connect with patient by video and verified that I am speaking with correct person by using two identifiers. I discussed the limitations of evaluation and management by telemedicine and the availability of in person appointments. I also discussed with the patient that there may be a patient responsible charge related to this service. The patient expressed understanding and agreed to proceed.  Harold Collins [id number] 43 y.o.  03/03/2024 10:08 AM  History of Present Illness:  Patient is evaluated by video session.  He is at work.  He reported big relief as he survived the cut and he was able to keep the job but have more responsibilities.  He reported things are better at home but he was very disappointed after he find out that his 42 year old daughter married without telling him.  Patient told she was living with him and there were few occasions they have a disagreement about things but he is very surprised that she got married without telling him.  He reported sleep on and off and sometimes nightmares but overall he feels things are going better.  He is very happy that his 61 year old son is doing very well and started playing soccer in middle school.  He had a good support from his wife.  We have recommended to take the hydroxyzine  3 times a day but he stays on 2 times a day because he feels does not need it and he is doing better on his current medication.  Occasionally he has a nightmares and flashback.  His appetite is okay.  His weight is stable.  He reported more challenges and responsibility at work but he is happy that he has a job.  He does not want to change the medication.  He denies any hopelessness or worthlessness.  Denies any suicidal thoughts.  His appetite is okay and his weight is unchanged from the past.  He was prescribed Ozempic  but his insurance did not approve and is trying  to get prior authorization.  Past Psychiatric History: H/O depression as a teenager. H/O cut himself and overdose but no inpatient treatment.  H/O severe mood swing, night mares, anger,  fights and depression.  While in [employer] spend 10 days jail due to assault charges. Tried Paxil, BuSpar , Lexapro, Brintellix and Effexor .  Trazodone  did not help for sleep.  Seroquel  caused increased triglycerides.  Mirtazapine  worked for a while.  We did gene testing Prozac  and Lamictal  are favorable.     Past Medical History:  Diagnosis Date   Anxiety    on meds   Barrett's esophagus    Depression    GERD (gastroesophageal reflux disease)    on meds   Major depressive disorder    on meds   PTSD (post-traumatic stress disorder)    on meds   Pulmonary embolism (HCC) 2018   post surgery-IV blood thinners in [facility]-   Pulmonary embolus and infarction (HCC) 06/21/2017   Post-op PE - on Dec 26th. On Xarelto - 65mo.        Seasonal allergies     Outpatient Encounter Medications as of 03/03/2024  Medication Sig   Cholecalciferol  (VITAMIN D3) 50 MCG (2000 UT) capsule Take 1 capsule by mouth daily.   fenofibrate  (TRICOR ) 48 MG tablet TAKE 1 TABLET BY MOUTH EVERY DAY   fluconazole  (DIFLUCAN ) 100 MG tablet Take 2 tablets (200 mg) on first day, then 1 tablet every day thereafter for 14  days.   FLUoxetine  (PROZAC ) 20 MG capsule Take 1 capsule by mouth once daily   hydrOXYzine  (VISTARIL ) 25 MG capsule Take 1 capsule (25 mg total) by mouth 3 (three) times daily.   lamoTRIgine  (LAMICTAL ) 200 MG tablet Take 1 tablet (200 mg total) by mouth daily.   niacin  (NIASPAN ) 1000 MG CR tablet Take 1 tablet (1,000 mg total) by mouth in the morning and at bedtime.   omeprazole  (PRILOSEC) 40 MG capsule Take 1 capsule (40 mg total) by mouth daily. PLEASE SCHEDULE AN APPT FOR FURTHER REFILLS   No facility-administered encounter medications on file as of 03/03/2024.    Recent Results (from the past 2160 hours)  CBC with  Differential/Platelet     Status: None   Collection Time: 02/27/24  8:06 AM  Result Value Ref Range   WBC 4.1 3.4 - 10.8 x10E3/uL   RBC 4.72 4.14 - 5.80 x10E6/uL   Hemoglobin 14.8 13.0 - 17.7 g/dL   Hematocrit 56.1 62.4 - 51.0 %   MCV 93 79 - 97 fL   MCH 31.4 26.6 - 33.0 pg   MCHC 33.8 31.5 - 35.7 g/dL   RDW 86.8 88.3 - 84.5 %   Platelets 364 150 - 450 x10E3/uL   Neutrophils 52 Not Estab. %   Lymphs 30 Not Estab. %   Monocytes 11 Not Estab. %   Eos 5 Not Estab. %   Basos 1 Not Estab. %   Neutrophils Absolute 2.1 1.4 - 7.0 x10E3/uL   Lymphocytes Absolute 1.3 0.7 - 3.1 x10E3/uL   Monocytes Absolute 0.5 0.1 - 0.9 x10E3/uL   EOS (ABSOLUTE) 0.2 0.0 - 0.4 x10E3/uL   Basophils Absolute 0.1 0.0 - 0.2 x10E3/uL   Immature Granulocytes 0 Not Estab. %   Immature Grans (Abs) 0.0 0.0 - 0.1 x10E3/uL  Comprehensive metabolic panel with GFR     Status: Abnormal   Collection Time: 02/27/24  8:06 AM  Result Value Ref Range   Glucose 161 (H) 70 - 99 mg/dL   BUN 16 6 - 24 mg/dL   Creatinine, Ser 8.78 0.76 - 1.27 mg/dL   eGFR 77 >40 fO/fpw/8.26   BUN/Creatinine Ratio 13 9 - 20   Sodium 138 134 - 144 mmol/L   Potassium 4.7 3.5 - 5.2 mmol/L   Chloride 101 96 - 106 mmol/L   CO2 20 20 - 29 mmol/L   Calcium  9.5 8.7 - 10.2 mg/dL   Total Protein 6.8 6.0 - 8.5 g/dL   Albumin 4.7 4.1 - 5.1 g/dL   Globulin, Total 2.1 1.5 - 4.5 g/dL   Bilirubin Total 0.5 0.0 - 1.2 mg/dL   Alkaline Phosphatase 83 44 - 121 IU/L    Comment: **Effective March 10, 2024 Alkaline Phosphatase**   reference interval will be changing to:              Age                Male          Male           0 -  5 days         47 - 127       47 - 127           6 - 10 days         29 - 242       29 - 242          11 - 20 days  109 - 357      109 - 357          21 - 30 days         94 - 494       94 - 494           1 -  2 months      149 - 539      149 - 539           3 -  6 months      131 - 452      131 - 452           7 -  11 months      117 - 401      117 - 401   12 months -  6 years       158 - 369      158 - 369           7 - 12 years       150 - 409      150 - 409               13 years       156 - 435       78 - 227               14 years       114 - 375       64 - 161               15 years        88 - 279       56 - 134               16 years        74 - 207       51 - 121               17 years        63 - 161       47 - 113          18 - 20 years        51 - 125       42 - 106          21 - 50 years         47 - 123       41 - 116          51 - 80 years        49 - 135       51 - 125              >80 years        48 - 129       48 - 129    AST 29 0 - 40 IU/L   ALT 57 (H) 0 - 44 IU/L  Lipid panel     Status: Abnormal   Collection Time: 02/27/24  8:06 AM  Result Value Ref Range   Cholesterol, Total 210 (H) 100 - 199 mg/dL   Triglycerides 364 (HH) 0 - 149 mg/dL   HDL 33 (L) >60 mg/dL   VLDL Cholesterol Cal 101 (H) 5 - 40 mg/dL   LDL Chol Calc (NIH) 76 0 - 99 mg/dL   Chol/HDL Ratio 6.4 (H) 0.0 - 5.0 ratio    Comment:  T. Chol/HDL Ratio                                             Men  Women                               1/2 Avg.Risk  3.4    3.3                                   Avg.Risk  5.0    4.4                                2X Avg.Risk  9.6    7.1                                3X Avg.Risk 23.4   11.0   Hemoglobin A1c     Status: Abnormal   Collection Time: 02/27/24  8:06 AM  Result Value Ref Range   Hgb A1c MFr Bld 7.5 (H) 4.8 - 5.6 %    Comment:          Prediabetes: 5.7 - 6.4          Diabetes: >6.4          Glycemic control for adults with diabetes: <7.0    Est. average glucose Bld gHb Est-mCnc 169 mg/dL  TSH     Status: None   Collection Time: 02/27/24  8:06 AM  Result Value Ref Range   TSH 3.810 0.450 - 4.500 uIU/mL  VITAMIN D  25 Hydroxy (Vit-D Deficiency, Fractures)     Status: None   Collection Time: 02/27/24  8:06 AM  Result Value Ref  Range   Vit D, 25-Hydroxy 66.7 30.0 - 100.0 ng/mL    Comment: Vitamin D  deficiency has been defined by the Institute of Medicine and an Endocrine Society practice guideline as a level of serum 25-OH vitamin D  less than 20 ng/mL (1,2). The Endocrine Society went on to further define vitamin D  insufficiency as a level between 21 and 29 ng/mL (2). 1. IOM (Institute of Medicine). 2010. Dietary reference    intakes for calcium  and D. [place]: The    [employer]. 2. Holick MF, Binkley [place], Bischoff-Ferrari HA, et al.    Evaluation, treatment, and prevention of vitamin D     deficiency: an Endocrine Society clinical practice    guideline. JCEM. 2011 Jul; 96(7):1911-30.      Psychiatric Specialty Exam: Physical Exam  Review of Systems  Weight 246 lb (111.6 kg).There is no height or weight on file to calculate BMI.  General Appearance: Casual  Eye Contact:  Fair  Speech:  Slow  Volume:  Normal  Mood:  Dysphoric  Affect:  Congruent  Thought Process:  Goal Directed  Orientation:  Full (Time, Place, and Person)  Thought Content:  Rumination  Suicidal Thoughts:  No  Homicidal Thoughts:  No  Memory:  Immediate;   Good Recent;   Good Remote;   Good  Judgement:  Intact  Insight:  Present  Psychomotor Activity:  Decreased  Concentration:  Concentration: Good and Attention Span: Good  Recall:  Good  Fund of Knowledge:  Good  Language:  Good  Akathisia:  No  Handed:  Right  AIMS (if indicated):     Assets:  Communication Skills Desire for Improvement Housing Social Support Talents/Skills Transportation  ADL's:  Intact  Cognition:  WNL  Sleep:  ok some times night mares       12/04/2023    8:19 AM 12/03/2023   10:18 AM 04/19/2021   10:11 AM 01/27/2021   11:31 AM 01/21/2021    4:05 AM  Depression screen PHQ 2/9  Decreased Interest 2 1 3 2 1   Down, Depressed, Hopeless 2 1 3 2 1   PHQ - 2 Score 4 2 6 4 2   Altered sleeping 2 1 1  0 0  Tired, decreased energy 3 1 3  2  0  Change in appetite 3 1 1 1  0  Feeling bad or failure about yourself  2 0 1 2 2   Trouble concentrating 3 0 1 1 1   Moving slowly or fidgety/restless 0 0 0 0 0  Suicidal thoughts 0 0 0 0 2  PHQ-9 Score 17 5 13 10 7   Difficult doing work/chores Very difficult Somewhat difficult  Somewhat difficult Somewhat difficult    Assessment/Plan: MDD (major depressive disorder), recurrent episode, moderate (HCC) - Plan: FLUoxetine  (PROZAC ) 20 MG capsule, hydrOXYzine  (VISTARIL ) 25 MG capsule  PTSD (post-traumatic stress disorder) - Plan: FLUoxetine  (PROZAC ) 20 MG capsule, hydrOXYzine  (VISTARIL ) 25 MG capsule, lamoTRIgine  (LAMICTAL ) 200 MG tablet  Anxiety - Plan: hydrOXYzine  (VISTARIL ) 25 MG capsule  Patient is 43 year old married employed man with a history of PTSD, major depressive disorder, anxiety, diabetes with increase hemoglobin A1c, mild elevation of liver enzymes.  Discussed family stress but he is pleased that at least job is stable and he is working.  He reported that he had left the door open for his daughter if she can come and pick up her stuff and belongings because now she is married and living with her husband.  She is disappointed but he understand that she can make her own decision.  Patient does not want to change the medication.  He is taking Lamictal  200 mg daily and Prozac  20 mg daily and hydroxyzine  25 mg 2 times a day.  I encourage if needed he can take the third pill but so far he has not needed.  Discussed medication side effects and benefits.  Recommend to call back if is any question or any concern.  Follow-up in 3 months.   Follow Up Instructions:     I discussed the assessment and treatment plan with the patient. The patient was provided an opportunity to ask questions and all were answered. The patient agreed with the plan and demonstrated an understanding of the instructions.   The patient was advised to call back or seek an in-person evaluation if the symptoms worsen or  if the condition fails to improve as anticipated.    Collaboration of Care: Other provider involved in patient's care AEB notes are available in epic to review  Patient/Guardian was advised Release of Information must be obtained prior to any record release in order to collaborate their care with an outside provider. Patient/Guardian was advised if they have not already done so to contact the registration department to sign all necessary forms in order for us  to release information regarding their care.   Consent: Patient/Guardian gives verbal consent for treatment and assignment of benefits for services provided during this visit. Patient/Guardian expressed understanding and agreed to proceed.  Total encounter time 18 minutes which includes face-to-face time, chart reviewed, care coordination, order entry and documentation during this encounter.   Note: This document was prepared by [employer] voice dictation technology and any errors that results from this process are unintentional.    Leni ONEIDA Client, MD 03/03/2024

## 2024-03-05 ENCOUNTER — Other Ambulatory Visit: Payer: Self-pay

## 2024-03-05 ENCOUNTER — Ambulatory Visit (INDEPENDENT_AMBULATORY_CARE_PROVIDER_SITE_OTHER)

## 2024-03-05 VITALS — BP 124/70 | HR 71 | Wt 249.0 lb

## 2024-03-05 DIAGNOSIS — E1169 Type 2 diabetes mellitus with other specified complication: Secondary | ICD-10-CM

## 2024-03-05 DIAGNOSIS — R7401 Elevation of levels of liver transaminase levels: Secondary | ICD-10-CM

## 2024-03-05 DIAGNOSIS — E119 Type 2 diabetes mellitus without complications: Secondary | ICD-10-CM | POA: Insufficient documentation

## 2024-03-05 DIAGNOSIS — E782 Mixed hyperlipidemia: Secondary | ICD-10-CM

## 2024-03-05 DIAGNOSIS — F332 Major depressive disorder, recurrent severe without psychotic features: Secondary | ICD-10-CM | POA: Diagnosis not present

## 2024-03-05 MED ORDER — EMPAGLIFLOZIN 10 MG PO TABS: 10.0000 mg | ORAL_TABLET | Freq: Every day | ORAL | 0 refills | Status: DC

## 2024-03-05 MED ORDER — FENOFIBRATE 48 MG PO TABS: 96.0000 mg | ORAL_TABLET | Freq: Every day | ORAL | 1 refills | Status: DC

## 2024-03-05 MED ORDER — EMPAGLIFLOZIN 25 MG PO TABS: 25.0000 mg | ORAL_TABLET | Freq: Every day | ORAL | 3 refills | Status: AC

## 2024-03-05 NOTE — Assessment & Plan Note (Signed)
 A1c 7.5. Patient has been without medication for a few weeks due to insurance issues and non-coverage of Ozempic  and Trulicity . Patient has previously tried Metformin  and Glipizide  but could not tolerate due to side effects. Jardiance  covered by insurance, offers renal and cardiac protection. - Prescribe Jardiance  10 mg daily for 30 days, then increase to 25 mg daily. - Patient declines supplies to check blood sugars at home at this time. - Will follow up on A1c in 4 months

## 2024-03-05 NOTE — Progress Notes (Signed)
 Established Patient Office Visit  Subjective   Patient ID: Harold Collins, male    DOB: 08-26-1980  Age: 43 y.o. MRN: [id number]  Chief Complaint  Patient presents with   Follow-up    Patient in room #5 and alone. Pt sates he is well and stable with no new concerns.    HPI  History of Present Illness   Harold Collins is a 43 year old male who presents for follow-up on his lipid panel and diabetes management.  Dyslipidemia - Lipid panel shows improvement overall, but triglycerides remain elevated - Started on fenofibrate  48 mg, which is well tolerated  Diabetes mellitus - Previously on medication, but currently without due to insurance issues with coverage on Ozempic  and Trulicity   - Patient has previously been on Metformin  and Glipizide , both of which he could not tolerated due to side effects.  Mood and anxiety disorders - History of major depressive disorder, post-traumatic stress disorder, and anxiety - Managed with fluoxetine , hydroxyzine , and lamotrigine  - Feels he is 'over a lot of stuff' and is addressing the chemical imbalance          ROS Per HPI.    Objective:     BP 124/70 (BP Location: Left Arm, Patient Position: Sitting, Cuff Size: Normal)   Pulse 71   Wt 249 lb (112.9 kg)   SpO2 97%   BMI 34.73 kg/m    Physical Exam Constitutional:      General: He is not in acute distress.    Appearance: Normal appearance.  Cardiovascular:     Rate and Rhythm: Normal rate and regular rhythm.     Heart sounds: Normal heart sounds. No murmur heard.    No friction rub. No gallop.  Pulmonary:     Effort: Pulmonary effort is normal. No respiratory distress.     Breath sounds: Normal breath sounds.  Musculoskeletal:        General: No swelling.  Skin:    General: Skin is warm and dry.  Neurological:     General: No focal deficit present.     Mental Status: He is alert.  Psychiatric:        Mood and Affect: Mood normal.        Behavior: Behavior normal.         Thought Content: Thought content normal.    No results found for any visits on 03/05/24.  Last CBC Lab Results  Component Value Date   WBC 4.1 02/27/2024   HGB 14.8 02/27/2024   HCT 43.8 02/27/2024   MCV 93 02/27/2024   MCH 31.4 02/27/2024   RDW 13.1 02/27/2024   PLT 364 02/27/2024   Last metabolic panel Lab Results  Component Value Date   GLUCOSE 161 (H) 02/27/2024   NA 138 02/27/2024   K 4.7 02/27/2024   CL 101 02/27/2024   CO2 20 02/27/2024   BUN 16 02/27/2024   CREATININE 1.21 02/27/2024   EGFR 77 02/27/2024   CALCIUM  9.5 02/27/2024   PHOS 3.2 07/25/2017   PROT 6.8 02/27/2024   ALBUMIN 4.7 02/27/2024   LABGLOB 2.1 02/27/2024   AGRATIO 2.1 04/19/2021   BILITOT 0.5 02/27/2024   ALKPHOS 83 02/27/2024   AST 29 02/27/2024   ALT 57 (H) 02/27/2024   ANIONGAP 9 01/20/2021   Last lipids Lab Results  Component Value Date   CHOL 210 (H) 02/27/2024   HDL 33 (L) 02/27/2024   LDLCALC 76 02/27/2024   LDLDIRECT 95.7 01/22/2021   TRIG 635 (HH) 02/27/2024  CHOLHDL 6.4 (H) 02/27/2024   Last hemoglobin A1c Lab Results  Component Value Date   HGBA1C 7.5 (H) 02/27/2024   Last thyroid functions Lab Results  Component Value Date   TSH 3.810 02/27/2024   Last vitamin D  Lab Results  Component Value Date   VD25OH 66.7 02/27/2024      The 10-year ASCVD risk score (Arnett DK, et al., 2019) is: 14.6%    Assessment & Plan:   Type 2 diabetes mellitus without complication, unspecified whether long term insulin use (HCC) Assessment & Plan: A1c 7.5. Patient has been without medication for a few weeks due to insurance issues and non-coverage of Ozempic  and Trulicity . Patient has previously tried Metformin  and Glipizide  but could not tolerate due to side effects. Jardiance  covered by insurance, offers renal and cardiac protection. - Prescribe Jardiance  10 mg daily for 30 days, then increase to 25 mg daily. - Patient declines supplies to check blood sugars at home at this  time. - Will follow up on A1c in 4 months    Mixed diabetic hyperlipidemia associated with type 2 diabetes mellitus (HCC) Assessment & Plan: Last lipid panel: LDL 76, HDL 33, Trig 635. The 10-year ASCVD risk score (Arnett DK, et al., 2019) is: 14.6% Will increase fenofibrate  from 48 mg daily to 96 mg daily. Triglycerides improving. Continue Niacin  1000 mg BID for now.  Revisited discussion of adding statin medication to reduce risk of heart attack and stroke. Patient wants to get triglycerides down with fenofibrate  dose optimized first and then is open to re-initiating statin therapy.    MDD (major depressive disorder), recurrent severe, without psychosis (HCC) Assessment & Plan: Patient currently following with psychiatry on Lamictal  200 mg daily, hydroxyzine  25 mg up to 3 times a day as needed, Prozac  20 mg daily. Mood is stable and patient feels like his symptoms are well-controlled on this current medication regimen. Encourage patient to continue following up with psychiatry regularly and follow their recommendations for treatment. Will continue to monitor.    Elevated ALT measurement- uncertain etiology Assessment & Plan: Most recent ALT elevated but stable at 57. Discussed that we will focus on improving his triglycerides in hopes that liver function will improve alongside. If liver labs continue to remain elevated, will get RUQ ultrasound to determine etiology.   Other orders -     Fenofibrate ; Take 2 tablets (96 mg total) by mouth daily.  Dispense: 90 tablet; Refill: 1 -     Empagliflozin ; Take 1 tablet (10 mg total) by mouth daily before breakfast.  Dispense: 30 tablet; Refill: 0 -     Empagliflozin ; Take 1 tablet (25 mg total) by mouth daily.  Dispense: 90 tablet; Refill: 3    Return in about 4 months (around 07/05/2024) for HLD, DM.    Saddie JULIANNA Sacks, PA-C

## 2024-03-05 NOTE — Patient Instructions (Addendum)
 VISIT SUMMARY: Today, we reviewed your lipid panel and diabetes management. Your lipid levels have improved overall, but your triglycerides are still high. We also discussed your diabetes management, and your A1c levels have improved. We addressed your concerns about your kidney function and reviewed your mental health status.  YOUR PLAN: -TYPE 2 DIABETES MELLITUS: Type 2 diabetes is a condition where your body does not use insulin properly, leading to high blood sugar levels. Your A1c was 7.5, which is close to our goal of being <7.0. We will start you on Jardiance  10 mg daily for 30 days, then increase to 25 mg daily. I have already sent in both prescriptions to your pharmacy.   -HYPERTRIGLYCERIDEMIA: Hypertriglyceridemia means you have high levels of triglycerides, a type of fat, in your blood. Your triglycerides are still elevated but improving. We will increase your fenofibrate  dose to 96 mg daily and monitor your triglyceride levels at your next follow-up - please take 2 of the 48 mg tablets you have at home.  -ELEVATED LIVER ENZYMES: Elevated liver enzymes can indicate liver inflammation or damage. Your liver enzymes are stable and likely related to your high triglycerides. We will monitor your liver enzymes as we manage your triglyceride levels and consider a liver ultrasound if the enzymes remain elevated.  -MAJOR DEPRESSIVE DISORDER, POST-TRAUMATIC STRESS DISORDER, AND GENERALIZED ANXIETY DISORDER: These are mental health conditions that affect your mood and anxiety levels. You will continue taking fluoxetine , hydroxyzine , and lamotrigine . Please maintain regular follow-ups with your psychiatrist and monitor for any changes in your mental health status.  INSTRUCTIONS: Please follow up for monitoring your triglyceride levels and liver enzymes. Continue your current medications for mental health and start the new diabetes medication as prescribed. If you have any concerns or notice any changes  in your health, contact our office.  If you have any problems before your next visit feel free to message me via MyChart (minor issues or questions) or call the office, otherwise you may reach out to schedule an office visit.  Thank you! Saddie Sacks, PA-C

## 2024-03-05 NOTE — Assessment & Plan Note (Signed)
 Patient currently following with psychiatry on Lamictal  200 mg daily, hydroxyzine  25 mg up to 3 times a day as needed, Prozac  20 mg daily.  Mood is stable and patient feels like his symptoms are well-controlled on this current medication regimen.  Encourage patient to continue following up with psychiatry regularly and follow their recommendations for treatment.  Will continue to monitor.

## 2024-03-05 NOTE — Assessment & Plan Note (Signed)
 Last lipid panel: LDL 76, HDL 33, Trig 635. The 10-year ASCVD risk score (Arnett DK, et al., 2019) is: 14.6% Will increase fenofibrate  from 48 mg daily to 96 mg daily. Triglycerides improving. Continue Niacin  1000 mg BID for now.  Revisited discussion of adding statin medication to reduce risk of heart attack and stroke. Patient wants to get triglycerides down with fenofibrate  dose optimized first and then is open to re-initiating statin therapy.

## 2024-03-05 NOTE — Assessment & Plan Note (Signed)
 Most recent ALT elevated but stable at 57. Discussed that we will focus on improving his triglycerides in hopes that liver function will improve alongside. If liver labs continue to remain elevated, will get RUQ ultrasound to determine etiology.

## 2024-04-04 ENCOUNTER — Other Ambulatory Visit: Payer: Self-pay

## 2024-05-29 ENCOUNTER — Other Ambulatory Visit: Payer: Self-pay | Admitting: Gastroenterology

## 2024-06-02 ENCOUNTER — Encounter (HOSPITAL_COMMUNITY): Payer: Self-pay | Admitting: Psychiatry

## 2024-06-02 ENCOUNTER — Telehealth (HOSPITAL_COMMUNITY): Payer: Self-pay | Admitting: Psychiatry

## 2024-06-02 VITALS — Wt 239.0 lb

## 2024-06-02 DIAGNOSIS — F419 Anxiety disorder, unspecified: Secondary | ICD-10-CM

## 2024-06-02 DIAGNOSIS — F4310 Post-traumatic stress disorder, unspecified: Secondary | ICD-10-CM

## 2024-06-02 DIAGNOSIS — F331 Major depressive disorder, recurrent, moderate: Secondary | ICD-10-CM | POA: Diagnosis not present

## 2024-06-02 MED ORDER — FLUOXETINE HCL 20 MG PO CAPS: ORAL_CAPSULE | ORAL | 2 refills | Status: AC

## 2024-06-02 MED ORDER — HYDROXYZINE PAMOATE 25 MG PO CAPS: 25.0000 mg | ORAL_CAPSULE | Freq: Two times a day (BID) | ORAL | 2 refills | Status: AC | PRN

## 2024-06-02 MED ORDER — LAMOTRIGINE 200 MG PO TABS: 200.0000 mg | ORAL_TABLET | Freq: Every day | ORAL | 2 refills | Status: AC

## 2024-06-02 NOTE — Progress Notes (Signed)
 [facility] MD Virtual Progress Note   Patient Location: Work Provider Location: Home Office  I connect with patient by video and verified that I am speaking with correct person by using two identifiers. I discussed the limitations of evaluation and management by telemedicine and the availability of in person appointments. I also discussed with the patient that there may be a patient responsible charge related to this service. The patient expressed understanding and agreed to proceed.  Harold Collins [id number] 43 y.o.  06/02/2024 10:26 AM  History of Present Illness:  Patient is evaluated by video session.  He is at work.  He reported things are same and he is taking all his medication as prescribed.  He admitted some marital stress because he was helping a girl financially who knows from his past and his wife find out.  He admitted feeling guilty and now agreed to have a couples counseling.  He also disappointed on his daughter who is now 68 year old and had not contact him since the last visit.  Patient has left messages and text on her holiday and birthday but had not gotten any response back.  Patient reported her job is challenging but going.  Sometime he worried about the laid off.  He had survived last laid off.  Patient lives with his wife and 37 year old son.  Occasionally nightmares and flashback when he think about his past but denies any active or passive suicidal thoughts or homicidal thoughts.  He is trying to lose weight and he had lost 10 pounds since he started Jardiance  from his primary care.  He admitted had not quit drinking but significantly cut down from the past.  Occasionally he drink 1 or 2 in a week.  He has no tremor or shakes or any EPS.  He denies any hallucination, paranoia.  He is taking hydroxyzine  twice a day which is helping his anxiety and his allergies.  He has appointment coming up to see his PCP for labs in 2 months.  He has a history of mild elevation  of liver enzymes.  Past Psychiatric History: H/O depression as a teenager. H/O cut himself and overdose but no inpatient treatment.  H/O severe mood swing, night mares, anger,  fights and depression.  While in [employer] spend 10 days jail due to assault charges. Tried Paxil, BuSpar , Lexapro, Brintellix and Effexor .  Trazodone  did not help for sleep.  Seroquel  caused increased triglycerides.  Mirtazapine  worked for a while.  We did gene testing Prozac  and Lamictal  are favorable.     Past Medical History:  Diagnosis Date   Anxiety    on meds   Barrett's esophagus    Depression    GERD (gastroesophageal reflux disease)    on meds   Major depressive disorder    on meds   PTSD (post-traumatic stress disorder)    on meds   Pulmonary embolism (HCC) 2018   post surgery-IV blood thinners in [facility]-   Pulmonary embolus and infarction (HCC) 06/21/2017   Post-op PE - on Dec 26th. On Xarelto - 56mo.        Seasonal allergies     Outpatient Encounter Medications as of 06/02/2024  Medication Sig   Cholecalciferol  (VITAMIN D3) 50 MCG (2000 UT) capsule Take 1 capsule by mouth daily.   empagliflozin  (JARDIANCE ) 10 MG TABS tablet Take 1 tablet (10 mg total) by mouth daily before breakfast.   empagliflozin  (JARDIANCE ) 25 MG TABS tablet Take 1 tablet (25 mg total) by mouth daily.  fenofibrate  (TRICOR ) 48 MG tablet Take 2 tablets (96 mg total) by mouth daily.   fluconazole  (DIFLUCAN ) 100 MG tablet Take 2 tablets (200 mg) on first day, then 1 tablet every day thereafter for 14 days. (Patient not taking: Reported on 03/05/2024)   FLUoxetine  (PROZAC ) 20 MG capsule Take 1 capsule by mouth once daily   hydrOXYzine  (VISTARIL ) 25 MG capsule Take 1 capsule (25 mg total) by mouth 2 (two) times daily as needed for anxiety.   lamoTRIgine  (LAMICTAL ) 200 MG tablet Take 1 tablet (200 mg total) by mouth daily.   niacin  (NIASPAN ) 1000 MG CR tablet Take 1 tablet (1,000 mg total) by mouth in the morning and at bedtime.   omeprazole   (PRILOSEC) 40 MG capsule Take 1 capsule (40 mg total) by mouth daily. PLEASE SCHEDULE AN APPT FOR any FURTHER REFILLS   No facility-administered encounter medications on file as of 06/02/2024.    No results found for this or any previous visit (from the past 2160 hours).    Psychiatric Specialty Exam: Physical Exam  Review of Systems  Constitutional:        Nausea    Weight 239 lb (108.4 kg).There is no height or weight on file to calculate BMI.  General Appearance: Casual  Eye Contact:  Fair  Speech:  Slow  Volume:  Normal  Mood:  Anxious  Affect:  Congruent  Thought Process:  Goal Directed  Orientation:  Full (Time, Place, and Person)  Thought Content:  WDL  Suicidal Thoughts:  No  Homicidal Thoughts:  No  Memory:  Immediate;   Good Recent;   Good Remote;   Good  Judgement:  Intact  Insight:  Present  Psychomotor Activity:  Decreased  Concentration:  Concentration: Good and Attention Span: Good  Recall:  Good  Fund of Knowledge:  Good  Language:  Good  Akathisia:  No  Handed:  Right  AIMS (if indicated):     Assets:  Communication Skills Desire for Improvement Housing Social Support Talents/Skills Transportation  ADL's:  Intact  Cognition:  WNL  Sleep:  still at times nightmares and flash back but sleep 7-8 hrs       03/05/2024    8:03 AM 12/04/2023    8:19 AM 12/03/2023   10:18 AM 04/19/2021   10:11 AM 01/27/2021   11:31 AM  Depression screen PHQ 2/9  Decreased Interest 3 2 1 3 2   Down, Depressed, Hopeless 1 2 1 3 2   PHQ - 2 Score 4 4 2 6 4   Altered sleeping 2 2 1 1  0  Tired, decreased energy 2 3 1 3 2   Change in appetite 3 3 1 1 1   Feeling bad or failure about yourself  2 2 0 1 2  Trouble concentrating 2 3 0 1 1  Moving slowly or fidgety/restless 0 0 0 0 0  Suicidal thoughts 0 0 0 0 0  PHQ-9 Score 15  17  5  13  10    Difficult doing work/chores  Very difficult Somewhat difficult  Somewhat difficult     Data saved with a previous flowsheet row  definition    Assessment/Plan: MDD (major depressive disorder), recurrent episode, moderate (HCC) - Plan: hydrOXYzine  (VISTARIL ) 25 MG capsule, FLUoxetine  (PROZAC ) 20 MG capsule  PTSD (post-traumatic stress disorder) - Plan: hydrOXYzine  (VISTARIL ) 25 MG capsule, lamoTRIgine  (LAMICTAL ) 200 MG tablet, FLUoxetine  (PROZAC ) 20 MG capsule  Anxiety - Plan: hydrOXYzine  (VISTARIL ) 25 MG capsule  Patient is 43 year old married employed man with a history  of PTSD, major depressive disorder, anxiety, diabetes and history of mild elevation of liver enzymes.  Discussed psychosocial stressor, family stress.  Is going to start couples counseling with his wife.  He does not want to change the medication since he noticed symptoms are stable and condition is not worsening.  He is trying to lose weight.  His last hemoglobin A1c 7.5.  Since started Jardiance  he noticed nausea and loss 3 pounds.  He has no rash or any itching.  Continue Lamictal  200 mg daily, Prozac  20 mg daily and hydroxyzine  25 mg 2 times a day.  Discussed medication side effects and benefits.  Recommend to call back if is any question or any concern.  Follow-up in 3 months.   Follow Up Instructions:     I discussed the assessment and treatment plan with the patient. The patient was provided an opportunity to ask questions and all were answered. The patient agreed with the plan and demonstrated an understanding of the instructions.   The patient was advised to call back or seek an in-person evaluation if the symptoms worsen or if the condition fails to improve as anticipated.    Collaboration of Care: Other provider involved in patient's care AEB notes are available in epic to review  Patient/Guardian was advised Release of Information must be obtained prior to any record release in order to collaborate their care with an outside provider. Patient/Guardian was advised if they have not already done so to contact the registration department to  sign all necessary forms in order for us  to release information regarding their care.   Consent: Patient/Guardian gives verbal consent for treatment and assignment of benefits for services provided during this visit. Patient/Guardian expressed understanding and agreed to proceed.     Total encounter time 16 minutes which includes face-to-face time, chart reviewed, care coordination, order entry and documentation during this encounter.   Note: This document was prepared by [employer] voice dictation technology and any errors that results from this process are unintentional.    Leni ONEIDA Client, MD 06/02/2024

## 2024-06-27 ENCOUNTER — Other Ambulatory Visit: Payer: Self-pay

## 2024-06-27 DIAGNOSIS — E0822 Diabetes mellitus due to underlying condition with diabetic chronic kidney disease: Secondary | ICD-10-CM

## 2024-06-27 DIAGNOSIS — E1169 Type 2 diabetes mellitus with other specified complication: Secondary | ICD-10-CM

## 2024-07-01 ENCOUNTER — Other Ambulatory Visit: Payer: Self-pay

## 2024-07-04 ENCOUNTER — Other Ambulatory Visit

## 2024-07-04 DIAGNOSIS — E1169 Type 2 diabetes mellitus with other specified complication: Secondary | ICD-10-CM

## 2024-07-04 DIAGNOSIS — N1831 Chronic kidney disease, stage 3a: Secondary | ICD-10-CM

## 2024-07-05 LAB — COMPREHENSIVE METABOLIC PANEL WITH GFR
ALT: 47 IU/L — ABNORMAL HIGH (ref 0–44)
AST: 26 IU/L (ref 0–40)
Albumin: 5 g/dL (ref 4.1–5.1)
Alkaline Phosphatase: 81 IU/L (ref 47–123)
BUN/Creatinine Ratio: 9 (ref 9–20)
BUN: 12 mg/dL (ref 6–24)
Bilirubin Total: 0.5 mg/dL (ref 0.0–1.2)
CO2: 25 mmol/L (ref 20–29)
Calcium: 9.8 mg/dL (ref 8.7–10.2)
Chloride: 101 mmol/L (ref 96–106)
Creatinine, Ser: 1.37 mg/dL — ABNORMAL HIGH (ref 0.76–1.27)
Globulin, Total: 2.2 g/dL (ref 1.5–4.5)
Glucose: 131 mg/dL — ABNORMAL HIGH (ref 70–99)
Potassium: 5 mmol/L (ref 3.5–5.2)
Sodium: 142 mmol/L (ref 134–144)
Total Protein: 7.2 g/dL (ref 6.0–8.5)
eGFR: 66 mL/min/1.73

## 2024-07-05 LAB — CBC WITH DIFFERENTIAL/PLATELET
Basophils Absolute: 0 x10E3/uL (ref 0.0–0.2)
Basos: 1 %
EOS (ABSOLUTE): 0.4 x10E3/uL (ref 0.0–0.4)
Eos: 7 %
Hematocrit: 48.1 % (ref 37.5–51.0)
Hemoglobin: 16.1 g/dL (ref 13.0–17.7)
Immature Grans (Abs): 0 x10E3/uL (ref 0.0–0.1)
Immature Granulocytes: 0 %
Lymphocytes Absolute: 1.5 x10E3/uL (ref 0.7–3.1)
Lymphs: 29 %
MCH: 31.2 pg (ref 26.6–33.0)
MCHC: 33.5 g/dL (ref 31.5–35.7)
MCV: 93 fL (ref 79–97)
Monocytes Absolute: 0.5 x10E3/uL (ref 0.1–0.9)
Monocytes: 10 %
Neutrophils Absolute: 2.7 x10E3/uL (ref 1.4–7.0)
Neutrophils: 53 %
Platelets: 359 x10E3/uL (ref 150–450)
RBC: 5.16 x10E6/uL (ref 4.14–5.80)
RDW: 12.9 % (ref 11.6–15.4)
WBC: 5.2 x10E3/uL (ref 3.4–10.8)

## 2024-07-05 LAB — LIPID PANEL
Chol/HDL Ratio: 6.4 ratio — ABNORMAL HIGH (ref 0.0–5.0)
Cholesterol, Total: 242 mg/dL — ABNORMAL HIGH (ref 100–199)
HDL: 38 mg/dL — ABNORMAL LOW
LDL Chol Calc (NIH): 126 mg/dL — ABNORMAL HIGH (ref 0–99)
Triglycerides: 434 mg/dL — ABNORMAL HIGH (ref 0–149)
VLDL Cholesterol Cal: 78 mg/dL — ABNORMAL HIGH (ref 5–40)

## 2024-07-05 LAB — HEMOGLOBIN A1C
Est. average glucose Bld gHb Est-mCnc: 157 mg/dL
Hgb A1c MFr Bld: 7.1 % — ABNORMAL HIGH (ref 4.8–5.6)

## 2024-07-06 ENCOUNTER — Ambulatory Visit: Payer: Self-pay

## 2024-07-11 ENCOUNTER — Ambulatory Visit

## 2024-07-11 VITALS — BP 113/66 | HR 67 | Temp 97.8°F | Ht 71.0 in | Wt 246.0 lb

## 2024-07-11 DIAGNOSIS — N1831 Chronic kidney disease, stage 3a: Secondary | ICD-10-CM | POA: Diagnosis not present

## 2024-07-11 DIAGNOSIS — E0822 Diabetes mellitus due to underlying condition with diabetic chronic kidney disease: Secondary | ICD-10-CM

## 2024-07-11 DIAGNOSIS — F332 Major depressive disorder, recurrent severe without psychotic features: Secondary | ICD-10-CM

## 2024-07-11 DIAGNOSIS — E119 Type 2 diabetes mellitus without complications: Secondary | ICD-10-CM | POA: Diagnosis not present

## 2024-07-11 DIAGNOSIS — L309 Dermatitis, unspecified: Secondary | ICD-10-CM

## 2024-07-11 DIAGNOSIS — E782 Mixed hyperlipidemia: Secondary | ICD-10-CM

## 2024-07-11 DIAGNOSIS — E781 Pure hyperglyceridemia: Secondary | ICD-10-CM | POA: Diagnosis not present

## 2024-07-11 DIAGNOSIS — R7401 Elevation of levels of liver transaminase levels: Secondary | ICD-10-CM

## 2024-07-11 DIAGNOSIS — E1169 Type 2 diabetes mellitus with other specified complication: Secondary | ICD-10-CM | POA: Diagnosis not present

## 2024-07-11 MED ORDER — TRIAMCINOLONE ACETONIDE 0.1 % EX CREA: 1.0000 | TOPICAL_CREAM | Freq: Two times a day (BID) | CUTANEOUS | 2 refills | Status: AC

## 2024-07-11 MED ORDER — TIRZEPATIDE 2.5 MG/0.5ML ~~LOC~~ SOAJ: 2.5000 mg | SUBCUTANEOUS | 2 refills | Status: AC

## 2024-07-11 MED ORDER — FENOFIBRATE 145 MG PO TABS: 145.0000 mg | ORAL_TABLET | Freq: Every day | ORAL | 2 refills | Status: AC

## 2024-07-11 NOTE — Patient Instructions (Signed)
 VISIT SUMMARY: Today we reviewed your lab results and discussed your medication management for diabetes, cholesterol, and a skin condition.  YOUR PLAN: TYPE 2 DIABETES MELLITUS: Your blood sugar levels have improved, and your A1c has decreased. -We started you on a new medication, Mounjaro , to help with weight loss and blood sugar control. -We discussed how to use the medication, including injection sites and possible side effects. -If you experience side effects, you can manage them with anti-nausea medication, Miralax, and omeprazole .  HYPERTRIGLYCERIDEMIA: Your triglyceride levels have improved with your current medications. -Continue taking fenofibrate  and niacin . -We will monitor your triglyceride levels as you lose weight. -If your triglyceride levels do not improve, we may consider adding a cholesterol medication.  ECZEMA OF FINGERS: You have a persistent rash on your fingers that has not responded to antifungal creams. -We prescribed triamcinolone  cream to use twice daily for two weeks. -You may use the cream as needed if you experience flare-ups.  If you have any problems before your next visit feel free to message me via MyChart (minor issues or questions) or call the office, otherwise you may reach out to schedule an office visit.  Thank you! Saddie Sacks, PA-C

## 2024-07-13 DIAGNOSIS — L309 Dermatitis, unspecified: Secondary | ICD-10-CM | POA: Insufficient documentation

## 2024-07-13 NOTE — Assessment & Plan Note (Signed)
 ALT still elevated at 47 but improved with triglyercides and tighter A1c control. Discussed that weight loss on Mounjaro  may aid in improving LFTs. Will cont to monitor.

## 2024-07-13 NOTE — Progress Notes (Signed)
 "  Established Patient Office Visit  Subjective   Patient ID: Harold Collins, male    DOB: [birth year]  Age: 44 y.o. MRN: [id number]  Chief Complaint  Patient presents with   Medical Management of Chronic Issues    HPI  Discussed the use of AI scribe software for clinical note transcription with the patient, who gave verbal consent to proceed.  History of Present Illness   Harold Collins is a 44 year old male with type 2 diabetes and hyperlipidemia who presents for follow-up of lab results and medication management.  Glycemic control - Type 2 diabetes with recent improvement in glycemic control - Hemoglobin A1c decreased from 7.5 to 7.1 - Fasting blood glucose improved from 161 to 131  Lipid management - Hyperlipidemia with cholesterol levels slightly elevated but no longer in the critical range - Currently taking fenofibrate  and niacin  for cholesterol management - No facial flushing with niacin  use - Family history of high triglycerides in father and brother  Hepatic and renal function - Liver function tests show slight improvement, with ALT decreased by ten points since last visit - Creatinine slightly elevated, likely due to dehydration during fasting; no prior history of abnormal creatinine  Cutaneous symptoms - Persistent pruritic rash between fingers, sometimes raw but not painful - Rash unresponsive to antifungal creams - No involvement of feet or other body areas - Similar small patches present on hands  Psychiatric stability - Stable on mood medications - No recent changes in psychiatric care    - Following with Dr. Arfeen.     ROS Per HPI.    Objective:     BP 113/66   Pulse 67   Temp 97.8 F (36.6 C) (Oral)   Ht 5' 11 (1.803 m)   Wt 246 lb (111.6 kg)   SpO2 97%   BMI 34.31 kg/m    Physical Exam Constitutional:      General: He is not in acute distress.    Appearance: Normal appearance.  Cardiovascular:     Rate and Rhythm: Normal rate and  regular rhythm.     Heart sounds: Normal heart sounds. No murmur heard.    No friction rub. No gallop.  Pulmonary:     Effort: Pulmonary effort is normal. No respiratory distress.     Breath sounds: Normal breath sounds.  Musculoskeletal:        General: No swelling.     Cervical back: Neck supple.  Lymphadenopathy:     Cervical: No cervical adenopathy.  Skin:    General: Skin is warm and dry.     Comments: Small, circular area of redness and flaking noted to the interdigital space between middle and ring finger of right hand  Neurological:     General: No focal deficit present.     Mental Status: He is alert.  Psychiatric:        Mood and Affect: Mood normal.        Behavior: Behavior normal.        Thought Content: Thought content normal.      No results found for any visits on 07/11/24.  Last CBC Lab Results  Component Value Date   WBC 5.2 07/04/2024   HGB 16.1 07/04/2024   HCT 48.1 07/04/2024   MCV 93 07/04/2024   MCH 31.2 07/04/2024   RDW 12.9 07/04/2024   PLT 359 07/04/2024   Last metabolic panel Lab Results  Component Value Date   GLUCOSE 131 (H) 07/04/2024   NA 142 07/04/2024  K 5.0 07/04/2024   CL 101 07/04/2024   CO2 25 07/04/2024   BUN 12 07/04/2024   CREATININE 1.37 (H) 07/04/2024   EGFR 66 07/04/2024   CALCIUM  9.8 07/04/2024   PHOS 3.2 07/25/2017   PROT 7.2 07/04/2024   ALBUMIN 5.0 07/04/2024   LABGLOB 2.2 07/04/2024   AGRATIO 2.1 04/19/2021   BILITOT 0.5 07/04/2024   ALKPHOS 81 07/04/2024   AST 26 07/04/2024   ALT 47 (H) 07/04/2024   ANIONGAP 9 01/20/2021   Last lipids Lab Results  Component Value Date   CHOL 242 (H) 07/04/2024   HDL 38 (L) 07/04/2024   LDLCALC 126 (H) 07/04/2024   LDLDIRECT 95.7 01/22/2021   TRIG 434 (H) 07/04/2024   CHOLHDL 6.4 (H) 07/04/2024   Last hemoglobin A1c Lab Results  Component Value Date   HGBA1C 7.1 (H) 07/04/2024   Last thyroid functions Lab Results  Component Value Date   TSH 3.810 02/27/2024    FREET4 1.03 07/25/2017   Last vitamin D  Lab Results  Component Value Date   VD25OH 66.7 02/27/2024      The 10-year ASCVD risk score (Arnett DK, et al., 2019) is: 13.4%    Assessment & Plan:   Hypertriglyceridemia -     Fenofibrate ; Take 1 tablet (145 mg total) by mouth daily.  Dispense: 90 tablet; Refill: 2  Type 2 diabetes mellitus without complication, without long-term current use of insulin (HCC) -     Tirzepatide ; Inject 2.5 mg into the skin once a week.  Dispense: 2 mL; Refill: 2 -     Ambulatory referral to Ophthalmology  Diabetes mellitus due to underlying condition with stage 3a chronic kidney disease, without long-term current use of insulin (HCC) Assessment & Plan: Trouble with getting GLP1's covered in the past. A1c still not at goal despite multiple oral agents. A1c 7.1. Continue jardiance  25 mg  Start Mounjaro  2.5 mg weekly with monthly dose titration as tolerated.  Foot exam, UACR UTD. Referral placed today for diabetic eye exam Follow up in 3 months   Elevated ALT measurement- uncertain etiology Assessment & Plan: ALT still elevated at 47 but improved with triglyercides and tighter A1c control. Discussed that weight loss on Mounjaro  may aid in improving LFTs. Will cont to monitor.   MDD (major depressive disorder), recurrent severe, without psychosis (HCC) Assessment & Plan: Patient currently following with psychiatry on Lamictal  200 mg daily, hydroxyzine  25 mg up to 3 times a day as needed, Prozac  20 mg daily. Mood is stable and patient feels like his symptoms are well-controlled on this current medication regimen. Encourage patient to continue following up with psychiatry regularly and follow their recommendations for treatment. Will continue to monitor.    Mixed diabetic hyperlipidemia associated with type 2 diabetes mellitus (HCC) Assessment & Plan: Last lipid panel: LDL 126, HDL 38, Trig 434. Triglycerides have made >200 point improvement with just  fenofibrate  48 mg and niacin  1000 mg BID alone. Patient forgot he was supposed to double up on fenofibrate . Have sent in the 145 mg dose of fenofibrate  for him to start taking.  Discussed likelihood of numbers continuing to improve with weight loss, but ultimately will need statin therapy down the road due to family history and diagnosis of T2DM.  - Monitor triglyceride levels with weight loss. - Consider rosuvastatin  if triglyceride levels do not improve.   Eczema, unspecified type Assessment & Plan: Eczema likely exacerbated by occupational exposure. Non-fungal etiology suggested by ineffective antifungal treatment. - Prescribed triamcinolone  cream  twice daily for two weeks. - Advised on potential flare-ups and use of triamcinolone  as needed.    Other orders -     Triamcinolone  Acetonide; Apply 1 Application topically 2 (two) times daily.  Dispense: 60 g; Refill: 2     Return in about 3 months (around 10/09/2024) for DM, HLD.    Saddie JULIANNA Sacks, PA-C "

## 2024-07-13 NOTE — Assessment & Plan Note (Signed)
 Eczema likely exacerbated by occupational exposure. Non-fungal etiology suggested by ineffective antifungal treatment. - Prescribed triamcinolone  cream twice daily for two weeks. - Advised on potential flare-ups and use of triamcinolone  as needed.

## 2024-07-13 NOTE — Assessment & Plan Note (Signed)
 Last lipid panel: LDL 126, HDL 38, Trig 434. Triglycerides have made >200 point improvement with just fenofibrate  48 mg and niacin  1000 mg BID alone. Patient forgot he was supposed to double up on fenofibrate . Have sent in the 145 mg dose of fenofibrate  for him to start taking.  Discussed likelihood of numbers continuing to improve with weight loss, but ultimately will need statin therapy down the road due to family history and diagnosis of T2DM.  - Monitor triglyceride levels with weight loss. - Consider rosuvastatin  if triglyceride levels do not improve.

## 2024-07-13 NOTE — Assessment & Plan Note (Signed)
 Patient currently following with psychiatry on Lamictal  200 mg daily, hydroxyzine  25 mg up to 3 times a day as needed, Prozac  20 mg daily.  Mood is stable and patient feels like his symptoms are well-controlled on this current medication regimen.  Encourage patient to continue following up with psychiatry regularly and follow their recommendations for treatment.  Will continue to monitor.

## 2024-07-13 NOTE — Assessment & Plan Note (Signed)
 Trouble with getting GLP1's covered in the past. A1c still not at goal despite multiple oral agents. A1c 7.1. Continue jardiance  25 mg  Start Mounjaro  2.5 mg weekly with monthly dose titration as tolerated.  Foot exam, UACR UTD. Referral placed today for diabetic eye exam Follow up in 3 months

## 2024-09-05 ENCOUNTER — Telehealth (HOSPITAL_COMMUNITY): Admitting: Psychiatry

## 2024-10-03 ENCOUNTER — Other Ambulatory Visit

## 2024-10-09 ENCOUNTER — Ambulatory Visit
